# Patient Record
Sex: Male | Born: 1945
Health system: Southern US, Community
[De-identification: ages and names within clinical notes are randomized; demographics above are authoritative.]

## PROBLEM LIST (undated history)

## (undated) DIAGNOSIS — C801 Malignant (primary) neoplasm, unspecified: Secondary | ICD-10-CM

## (undated) DIAGNOSIS — Z789 Other specified health status: Secondary | ICD-10-CM

## (undated) DIAGNOSIS — Z923 Personal history of irradiation: Secondary | ICD-10-CM

## (undated) DIAGNOSIS — C8590 Non-Hodgkin lymphoma, unspecified, unspecified site: Secondary | ICD-10-CM

## (undated) HISTORY — PX: CATARACT EXTRACTION: SUR2

---

## 2002-10-23 ENCOUNTER — Encounter: Payer: Self-pay | Admitting: Emergency Medicine

## 2002-10-23 ENCOUNTER — Inpatient Hospital Stay (HOSPITAL_COMMUNITY): Admission: EM | Admit: 2002-10-23 | Discharge: 2002-10-25 | Payer: Self-pay | Admitting: Emergency Medicine

## 2011-12-03 ENCOUNTER — Other Ambulatory Visit: Payer: Self-pay | Admitting: Family Medicine

## 2011-12-03 DIAGNOSIS — Z136 Encounter for screening for cardiovascular disorders: Secondary | ICD-10-CM

## 2011-12-05 ENCOUNTER — Ambulatory Visit
Admission: RE | Admit: 2011-12-05 | Discharge: 2011-12-05 | Disposition: A | Discharge status: Home / Self Care | Payer: Managed Care, Other (non HMO) | Source: Ambulatory Visit | Attending: Family Medicine | Admitting: Family Medicine

## 2011-12-05 DIAGNOSIS — Z136 Encounter for screening for cardiovascular disorders: Secondary | ICD-10-CM

## 2012-09-17 DIAGNOSIS — Z23 Encounter for immunization: Secondary | ICD-10-CM | POA: Diagnosis not present

## 2012-12-06 DIAGNOSIS — Z13220 Encounter for screening for lipoid disorders: Secondary | ICD-10-CM | POA: Diagnosis not present

## 2012-12-06 DIAGNOSIS — Z0000 Encounter for general adult medical examination without abnormal findings: Secondary | ICD-10-CM | POA: Diagnosis not present

## 2012-12-06 DIAGNOSIS — Z131 Encounter for screening for diabetes mellitus: Secondary | ICD-10-CM | POA: Diagnosis not present

## 2012-12-06 DIAGNOSIS — E7800 Pure hypercholesterolemia, unspecified: Secondary | ICD-10-CM | POA: Diagnosis not present

## 2012-12-06 DIAGNOSIS — Z125 Encounter for screening for malignant neoplasm of prostate: Secondary | ICD-10-CM | POA: Diagnosis not present

## 2012-12-06 DIAGNOSIS — Z23 Encounter for immunization: Secondary | ICD-10-CM | POA: Diagnosis not present

## 2012-12-06 DIAGNOSIS — J069 Acute upper respiratory infection, unspecified: Secondary | ICD-10-CM | POA: Diagnosis not present

## 2013-02-03 DIAGNOSIS — Z961 Presence of intraocular lens: Secondary | ICD-10-CM | POA: Diagnosis not present

## 2013-12-12 DIAGNOSIS — Z131 Encounter for screening for diabetes mellitus: Secondary | ICD-10-CM | POA: Diagnosis not present

## 2013-12-12 DIAGNOSIS — Z23 Encounter for immunization: Secondary | ICD-10-CM | POA: Diagnosis not present

## 2013-12-12 DIAGNOSIS — Z0000 Encounter for general adult medical examination without abnormal findings: Secondary | ICD-10-CM | POA: Diagnosis not present

## 2013-12-12 DIAGNOSIS — Z136 Encounter for screening for cardiovascular disorders: Secondary | ICD-10-CM | POA: Diagnosis not present

## 2013-12-12 DIAGNOSIS — Z125 Encounter for screening for malignant neoplasm of prostate: Secondary | ICD-10-CM | POA: Diagnosis not present

## 2013-12-12 DIAGNOSIS — Z1331 Encounter for screening for depression: Secondary | ICD-10-CM | POA: Diagnosis not present

## 2014-01-19 DIAGNOSIS — Z961 Presence of intraocular lens: Secondary | ICD-10-CM | POA: Diagnosis not present

## 2014-09-15 DIAGNOSIS — Z23 Encounter for immunization: Secondary | ICD-10-CM | POA: Diagnosis not present

## 2014-12-14 DIAGNOSIS — Z125 Encounter for screening for malignant neoplasm of prostate: Secondary | ICD-10-CM | POA: Diagnosis not present

## 2014-12-14 DIAGNOSIS — Z136 Encounter for screening for cardiovascular disorders: Secondary | ICD-10-CM | POA: Diagnosis not present

## 2014-12-14 DIAGNOSIS — Z131 Encounter for screening for diabetes mellitus: Secondary | ICD-10-CM | POA: Diagnosis not present

## 2014-12-14 DIAGNOSIS — Z23 Encounter for immunization: Secondary | ICD-10-CM | POA: Diagnosis not present

## 2014-12-14 DIAGNOSIS — Z0000 Encounter for general adult medical examination without abnormal findings: Secondary | ICD-10-CM | POA: Diagnosis not present

## 2015-02-12 DIAGNOSIS — Z961 Presence of intraocular lens: Secondary | ICD-10-CM | POA: Diagnosis not present

## 2015-09-21 DIAGNOSIS — Z23 Encounter for immunization: Secondary | ICD-10-CM | POA: Diagnosis not present

## 2015-12-20 DIAGNOSIS — Z0000 Encounter for general adult medical examination without abnormal findings: Secondary | ICD-10-CM | POA: Diagnosis not present

## 2015-12-20 DIAGNOSIS — E785 Hyperlipidemia, unspecified: Secondary | ICD-10-CM | POA: Diagnosis not present

## 2015-12-20 DIAGNOSIS — Z131 Encounter for screening for diabetes mellitus: Secondary | ICD-10-CM | POA: Diagnosis not present

## 2016-02-06 HISTORY — PX: COLONOSCOPY: SHX174

## 2016-02-15 DIAGNOSIS — Z1211 Encounter for screening for malignant neoplasm of colon: Secondary | ICD-10-CM | POA: Diagnosis not present

## 2016-02-18 DIAGNOSIS — Z961 Presence of intraocular lens: Secondary | ICD-10-CM | POA: Diagnosis not present

## 2016-09-19 DIAGNOSIS — Z23 Encounter for immunization: Secondary | ICD-10-CM | POA: Diagnosis not present

## 2016-12-23 DIAGNOSIS — E7800 Pure hypercholesterolemia, unspecified: Secondary | ICD-10-CM | POA: Diagnosis not present

## 2016-12-23 DIAGNOSIS — Z125 Encounter for screening for malignant neoplasm of prostate: Secondary | ICD-10-CM | POA: Diagnosis not present

## 2016-12-23 DIAGNOSIS — Z0001 Encounter for general adult medical examination with abnormal findings: Secondary | ICD-10-CM | POA: Diagnosis not present

## 2016-12-23 DIAGNOSIS — Z79899 Other long term (current) drug therapy: Secondary | ICD-10-CM | POA: Diagnosis not present

## 2017-02-19 DIAGNOSIS — Z961 Presence of intraocular lens: Secondary | ICD-10-CM | POA: Diagnosis not present

## 2017-06-11 ENCOUNTER — Other Ambulatory Visit: Payer: Self-pay | Admitting: Family Medicine

## 2017-06-11 DIAGNOSIS — R591 Generalized enlarged lymph nodes: Secondary | ICD-10-CM

## 2017-06-12 ENCOUNTER — Ambulatory Visit
Admission: RE | Admit: 2017-06-12 | Discharge: 2017-06-12 | Disposition: A | Discharge status: Home / Self Care | Payer: Medicare Other | Source: Ambulatory Visit | Attending: Family Medicine | Admitting: Family Medicine

## 2017-06-12 DIAGNOSIS — R591 Generalized enlarged lymph nodes: Secondary | ICD-10-CM

## 2017-06-15 ENCOUNTER — Other Ambulatory Visit: Payer: Self-pay | Admitting: Family Medicine

## 2017-06-15 ENCOUNTER — Ambulatory Visit
Admission: RE | Admit: 2017-06-15 | Discharge: 2017-06-15 | Disposition: A | Discharge status: Home / Self Care | Payer: Medicare Other | Source: Ambulatory Visit | Attending: Family Medicine | Admitting: Family Medicine

## 2017-06-15 ENCOUNTER — Other Ambulatory Visit: Payer: Medicare Other

## 2017-06-15 DIAGNOSIS — R221 Localized swelling, mass and lump, neck: Secondary | ICD-10-CM

## 2017-06-15 DIAGNOSIS — R918 Other nonspecific abnormal finding of lung field: Secondary | ICD-10-CM | POA: Diagnosis not present

## 2017-06-15 MED ORDER — IOPAMIDOL (ISOVUE-300) INJECTION 61%
75.0000 mL | Freq: Once | INTRAVENOUS | Status: AC | PRN
  Administered 2017-06-15: 75 mL via INTRAVENOUS

## 2017-06-16 ENCOUNTER — Other Ambulatory Visit (HOSPITAL_COMMUNITY): Payer: Self-pay | Admitting: Family Medicine

## 2017-06-16 DIAGNOSIS — R221 Localized swelling, mass and lump, neck: Secondary | ICD-10-CM

## 2017-06-17 ENCOUNTER — Other Ambulatory Visit: Payer: Self-pay | Admitting: Otolaryngology

## 2017-06-17 DIAGNOSIS — Z7289 Other problems related to lifestyle: Secondary | ICD-10-CM | POA: Diagnosis not present

## 2017-06-17 DIAGNOSIS — Z87891 Personal history of nicotine dependence: Secondary | ICD-10-CM | POA: Diagnosis not present

## 2017-06-17 DIAGNOSIS — C770 Secondary and unspecified malignant neoplasm of lymph nodes of head, face and neck: Secondary | ICD-10-CM | POA: Diagnosis not present

## 2017-06-17 DIAGNOSIS — R590 Localized enlarged lymph nodes: Secondary | ICD-10-CM | POA: Diagnosis not present

## 2017-06-23 ENCOUNTER — Telehealth: Payer: Self-pay | Admitting: Hematology and Oncology

## 2017-06-23 ENCOUNTER — Encounter: Payer: Self-pay | Admitting: Hematology and Oncology

## 2017-06-23 NOTE — Telephone Encounter (Signed)
Appt has been scheduled for the pt to see Dr. Lebron Conners on 7/24 at 11am after his PET scan. [name] wife agreed to the appt date and time. Letter mailed and faxed to the referring.

## 2017-06-24 ENCOUNTER — Encounter: Payer: Self-pay | Admitting: *Deleted

## 2017-06-24 NOTE — Progress Notes (Signed)
Oncology Nurse Navigator Documentation  Placed introductory call to new referral patient.  Spoke with his wife as he was sleeping.    Introduced myself as the H&N oncology nurse navigator that works with Dr. Lebron Conners to whom he has been referred by Dr. Dorthy Cooler.  She confirmed his understanding of referral and appt date/time of 7/24 1100 following 0700 PET.  I briefly explained my role as their navigator, indicated that I would be joining them during his appt next week.  She indicated husband is edentulous.  I explained the purpose of a dental evaluation prior to starting RT, indicated he wd be contacted by [facility] to arrange an appt within a day or so of his appt with Dr. Isidore Moos.   I provided my contact information, encouraged them to call with questions/concerns before next week.  She verbalized understanding of information provided, expressed appreciation for my call.  Gayleen Orem, RN, BSN, [place] [facility] Oncology Nurse [place] at [place] [phone]

## 2017-06-30 ENCOUNTER — Encounter: Payer: Self-pay | Admitting: Hematology and Oncology

## 2017-06-30 ENCOUNTER — Ambulatory Visit (HOSPITAL_BASED_OUTPATIENT_CLINIC_OR_DEPARTMENT_OTHER): Payer: Medicare Other | Admitting: Hematology and Oncology

## 2017-06-30 ENCOUNTER — Encounter: Payer: Self-pay | Admitting: *Deleted

## 2017-06-30 ENCOUNTER — Ambulatory Visit (HOSPITAL_COMMUNITY)
Admission: RE | Admit: 2017-06-30 | Discharge: 2017-06-30 | Disposition: A | Discharge status: Home / Self Care | Payer: Medicare Other | Source: Ambulatory Visit | Attending: Family Medicine | Admitting: Family Medicine

## 2017-06-30 VITALS — BP 129/69 | HR 61 | Temp 98.0°F | Resp 18 | Ht 68.0 in | Wt 165.7 lb

## 2017-06-30 DIAGNOSIS — R221 Localized swelling, mass and lump, neck: Secondary | ICD-10-CM | POA: Diagnosis not present

## 2017-06-30 DIAGNOSIS — J320 Chronic maxillary sinusitis: Secondary | ICD-10-CM | POA: Diagnosis not present

## 2017-06-30 DIAGNOSIS — I700 Atherosclerosis of aorta: Secondary | ICD-10-CM | POA: Insufficient documentation

## 2017-06-30 DIAGNOSIS — D3502 Benign neoplasm of left adrenal gland: Secondary | ICD-10-CM | POA: Diagnosis not present

## 2017-06-30 DIAGNOSIS — D3500 Benign neoplasm of unspecified adrenal gland: Secondary | ICD-10-CM | POA: Diagnosis not present

## 2017-06-30 LAB — GLUCOSE, CAPILLARY: Glucose-Capillary: 108 mg/dL — ABNORMAL HIGH (ref 65–99)

## 2017-06-30 MED ORDER — FLUDEOXYGLUCOSE F - 18 (FDG) INJECTION
8.0000 | Freq: Once | INTRAVENOUS | Status: AC | PRN
  Administered 2017-06-30: 8 via INTRAVENOUS

## 2017-06-30 NOTE — Progress Notes (Signed)
[facility] New Visit:  Assessment: Mass of right side of neck Very healthy 71 year old male with appearance of a new mass in the right side of the neck extending into the supraclavicular region. Imaging has been obtained including CT of the neck, CT of the chest, and the PET CT obtained earlier today. The first 2 studies have been formally evaluated by radiology, PET/CT was looked at by myself alone. Initial radiology reporting is pending at this time  To my evaluation, PET/CT demonstrates a remarkably hypermetabolic mass in the expected location, but also a small hypermetabolic focus likely located in the right parotid gland as well as a couple of hypermetabolic foci in the mediastinum likely representing additional lymph node sites. Once of hypermetabolic disease in the lungs, retroperitoneum, abdomen, or skeletal structures on my evaluation.  At this time, the findings are strongly suggestive of a malignancy. Differential includes a lymphoma, squamous cell carcinoma of head and neck, lung cancer, in that order. Other malignancies are also possible.  Plan: --Refer back to Dr Constance Holster (ENT for repeat Bx) -- recommend incisional biopsy to obtain a large pathological sample. --RTC in my clinic 1-2 weeks subsequent to the Bx discussed the diagnosis and treatment strategies.  Voice recognition software was used and creation of this note. Despite my best effort at editing the text, some misspelling/errors may have occurred.   Orders Placed This Encounter  Procedures  . Ambulatory referral to ENT    Referral Priority:   Urgent    Referral Type:   Consultation    Referral Reason:   Specialty Services Required    Referred to Provider:   Izora Gala, MD    Requested Specialty:   Otolaryngology    Number of Visits Requested:   1    All questions were answered.  . The patient knows to call the clinic with any problems, questions or concerns.  This note was electronically  signed.    History of Presenting Illness Todd Bell 71 y.o. presenting to the [facility] for evaluation and management planning for a right cervical mass.  Patient has initially discovered asymmetry in his neck with visible mass earlier in July. He was seen by his primary care provider and subsequently directly to be evaluated by ENT. Patient underwent initially ultrasound of the neck demonstrating a large mass, subsequently he underwent CT of the neck on 06/15/17 demonstrating normal laryngeal, pharyngeal, salivary structures and a 5.0 cm supraclavicular mass on the right side. CT of the chest obtained the same time demonstrated mass, but no lymphadenopathy in the mediastinum, hilar structures, and a 0.4 cm right upper lobe nodule and a 0.5 cm left upper lobe nodule. Patient underwent an FNA on 06/17/17 which demonstrated presence of malignant cells that could not be further characterized due to insufficient sample.  Patient denies any significant symptoms in association with this mass. He denies any fevers, chills, night sweats. No significant weight loss. No changes to her activities of daily living or activity tolerance. Denies changes to his appetite. Denies chest pain, shortness of breath or cough. His nausea, early satiety, abdominal pain, diarrhea, or constipation. No dysuria or hematuria. Denies any cutaneous changes. Denies any vision changes, hearing changes, swallowing difficulties, any focal weakness or sensory deficits in the face or extremities.  Oncological/hematological History:  Medical History: History reviewed. No pertinent past medical history.  Surgical History: Past Surgical History:  Procedure Laterality Date  . COLONOSCOPY  02/2016    Family History: Family History  Problem Relation Age of Onset  . Cancer Father        Pancreatic    Social History: Social History   Social History  . Marital status: Married    Spouse name: N/A  . Number of children: N/A   . Years of education: N/A   Occupational History  . Not on file.   Social History Main Topics  . Smoking status: Former Smoker    Quit date: 1988  . Smokeless tobacco: Never Used  . Alcohol use Yes     Comment: occasional  . Drug use: No  . Sexual activity: Not on file   Other Topics Concern  . Not on file   Social History Narrative  . No narrative on file    Allergies: Not on File  Medications:  Current Outpatient Prescriptions  Medication Sig Dispense Refill  . aspirin EC 81 MG tablet Take 81 mg by mouth daily.     No current facility-administered medications for this visit.     Review of Systems: Review of Systems  All other systems reviewed and are negative.    PHYSICAL EXAMINATION Blood pressure 129/69, pulse 61, temperature 98 F (36.7 C), temperature source Oral, resp. rate 18, height 5\' 8"  (1.727 m), weight 165 lb 11.2 oz (75.2 kg), SpO2 100 %.  ECOG PERFORMANCE STATUS: 0 - Asymptomatic  Physical Exam  Constitutional: He is oriented to person, place, and time and well-developed, well-nourished, and in no distress. Vital signs are normal. He appears not malnourished, not dehydrated and not jaundiced. He does not have a sickly appearance. No distress.  HENT:  Mouth/Throat: Uvula is midline, oropharynx is clear and moist and mucous membranes are normal. Mucous membranes are not pale, not dry and not cyanotic. He has dentures. Normal dentition.  Eyes: Pupils are equal, round, and reactive to light. Conjunctivae and EOM are normal. No scleral icterus.  Neck: No JVD present. Carotid bruit is not present.  Large mass is visibly present over the middle and lower aspects of the right neck. It appears to be firm to palpation with a regular surface of hard texture. It appears to be adherent to the underlying structures.  Cardiovascular: Normal rate, regular rhythm and normal heart sounds.   No murmur heard. Pulmonary/Chest: Effort normal and breath sounds normal.  He has no decreased breath sounds. He has no wheezes. He has no rhonchi. He has no rales.  Abdominal: Soft. Normal appearance and normal aorta. There is no hepatosplenomegaly. There is no tenderness.  Lymphadenopathy:       Head (right side): No submental, no submandibular, no tonsillar and no occipital adenopathy present.       Head (left side): No submental, no submandibular, no tonsillar and no occipital adenopathy present.    He has cervical adenopathy.       Right cervical: Superficial cervical adenopathy present. No posterior cervical adenopathy present.      Left cervical: No superficial cervical, no deep cervical and no posterior cervical adenopathy present.    He has no axillary adenopathy.       Right: Supraclavicular adenopathy present. No inguinal adenopathy present.       Left: No inguinal and no supraclavicular adenopathy present.  Neurological: He is alert and oriented to person, place, and time. He has normal sensation, normal strength, normal reflexes and intact cranial nerves.  Skin: He is not diaphoretic.     LABORATORY DATA: I have personally reviewed the data as listed: Hospital Outpatient Visit on 06/30/2017  Component Date Value Ref Range Status  . Glucose-Capillary 06/30/2017 108* 65 - 99 mg/dL Final         Ardath Sax, MD

## 2017-06-30 NOTE — Assessment & Plan Note (Signed)
Very healthy 71 year old male with appearance of a new mass in the right side of the neck extending into the supraclavicular region. Imaging has been obtained including CT of the neck, CT of the chest, and the PET CT obtained earlier today. The first 2 studies have been formally evaluated by radiology, PET/CT was looked at by myself alone. Initial radiology reporting is pending at this time  To my evaluation, PET/CT demonstrates a remarkably hypermetabolic mass in the expected location, but also a small hypermetabolic focus likely located in the right parotid gland as well as a couple of hypermetabolic foci in the mediastinum likely representing additional lymph node sites. Once of hypermetabolic disease in the lungs, retroperitoneum, abdomen, or skeletal structures on my evaluation.  At this time, the findings are strongly suggestive of a malignancy. Differential includes a lymphoma, squamous cell carcinoma of head and neck, lung cancer, in that order. Other malignancies are also possible.  Plan: --Refer back to Dr Constance Holster (ENT for repeat Bx) -- recommend incisional biopsy to obtain a large pathological sample. --RTC in my clinic 1-2 weeks subsequent to the Bx discussed the diagnosis and treatment strategies.  Voice recognition software was used and creation of this note. Despite my best effort at editing the text, some misspelling/errors may have occurred.

## 2017-07-01 NOTE — Progress Notes (Signed)
Oncology Nurse Navigator Documentation  Met with patient during initial consult with Dr. Lebron Conners.  He was accompanied by his wife. 1. Further introduced myself as his/their Navigator, explained my role as a member of the Care Team. 2. Provided New Patient Information packet:  Contact information for physician, this navigator, other members of the Care Team  Advance Directive information ([place] blue pamphlet with LCSW insert)  Fall Prevention Patient Safety Plan  Appointment [place] sheet  [facility] campus map with highlight of [place]. 3. They voiced understanding of favorable PET results, need for surgical bx to be requested with ENT Dr. Constance Holster by Dr. Lebron Conners. I encouraged them to call with questions/concerns, they verbalized understanding.  Gayleen Orem, RN, BSN, [place] [facility] Oncology Nurse [place] at [place] [phone]

## 2017-07-06 ENCOUNTER — Telehealth: Payer: Self-pay | Admitting: *Deleted

## 2017-07-06 NOTE — Telephone Encounter (Signed)
Oncology Nurse Navigator Documentation  Received call from patient wife indicating they have not heard from Dr. Janeice Robinson office re scheduling of surgical bx s/p last week's consult with Dr. Lebron Conners.  I LVMM with Dr. Janeice Robinson MA Olivia Mackie with this information, called patient wife to update.  Gayleen Orem, RN, BSN, [place] [facility]ck Oncology Nurse [place] at [place] [phone]

## 2017-07-08 NOTE — H&P (Signed)
HPI:   Todd Bell is a 71 y.o. male who presents as a consult Patient.   Referring Provider: Norman Herrlich, MD  Chief complaint: Neck mass.  HPI: Very healthy gentleman, noticed a mass in the lower right neck a couple of weeks ago while shaving. He has no pain. He denies any fever, chills, change in appetite, weight loss, sore throat, ear pain, trouble breathing, swallowing, voice change. He smoked many years ago. He drinks occasionally. CT scan revealed a suspicious supraclavicular mass, he is scheduled for PET scan in a couple of weeks.  PMH/Meds/All/SocHx/FamHx/ROS:   Past Medical History:  Diagnosis Date  . Throat cancer [facility])   Past Surgical History:  Procedure Laterality Date  . CATARACT EXTRACTION W/ INTRAOCULAR LENS IMPLANT Bilateral 2010   No family history of bleeding disorders, wound healing problems or difficulty with anesthesia.   Social History   Social History  . Marital status: Married  Spouse name: N/A  . Number of children: N/A  . Years of education: N/A   Occupational History  . Not on file.   Social History Main Topics  . Smoking status: Former Smoker  Types: Cigarettes  . Smokeless tobacco: Never Used  . Alcohol use Yes  . Drug use: Unknown  . Sexual activity: Not on file   Other Topics Concern  . Not on file   Social History Narrative  . No narrative on file   Current Outpatient Prescriptions:  . ibuprofen (ADVIL ORAL), Take by mouth as needed., Disp: , Rfl:  . aspirin 81 MG chewable tablet *ANTIPLATELET*, Take 81 mg by mouth daily., Disp: , Rfl:   A complete ROS was performed with pertinent positives/negatives noted in the HPI. The remainder of the ROS are negative.   Physical Exam:   BP 130/79   General: Healthy and alert, in no distress, breathing easily. Normal affect. In a pleasant mood. Head: Normocephalic, atraumatic. No masses, or scars. Eyes: Pupils are equal, and reactive to light. Vision is grossly intact. No  spontaneous or gaze nystagmus. Ears: Ear canals are clear. Tympanic membranes are intact, with normal landmarks and the middle ears are clear and healthy. Hearing: Grossly normal. Nose: Nasal cavities are clear with healthy mucosa, no polyps or exudate.Airways are patent. Face: No masses or scars, facial nerve function is symmetric. Oral Cavity: No mucosal abnormalities are noted. Tongue with normal mobility. Dentition appears healthy. Oropharynx: Tonsils are symmetric. There are no mucosal masses identified. Tongue base appears normal and healthy. Larynx/Hypopharynx: Inadequate to visualize the larynx, hypopharynx or nasopharynx. Chest: Deferred Neck: Large firm supraclavicular mass on the right, at least 6 cm in diameter, no thyroid nodules or enlargement. Neuro: Cranial nerves II-XII will normal function. Balance: Normal gate. Other findings: none.  Independent Review of Additional Tests or Records:  none  Procedures:  Procedure note: Flexible fiberoptic laryngoscopy  Details of the procedure were explained to the patient and all questions were answered.   Procedure:   After anesthetizing the nasal cavity with topical lidocaine and oxymetazoline, the flexible endoscope was introduced and passed through the nasal cavity into the nasopharynx. The scope was then advanced to the level of the oropharynx, then the hypopharynx and larynx.  Findings:   The posterior soft palate, uvula, tongue base and vallecula were visualized and appeared healthy without mucosal masses or lesions. The epiglottis, aryepiglottic folds, hypopharynx, supraglottis, glottis were visualized and appeared healthy without mucosal masses or lesions. Vocal fold mobility was intact and symmetric.   Additional findings: None  The scope was withdrawn from the nose. He tolerated the procedure well.  Procedure Note:  Indications for procedure: neck mass  Details of the procedure were discussed with the patient and  all questions were answered.  Procedure:  2% xylocaine with epinephrine was infiltrated into the overlying skin. First pass was made with a 25 gauge needle and 10 cc syringe. Second pass was made with a 22 gauge needle. Specimen was placed on microscopic slides and air-dried. Additional material was placed in Cytolyte solution for cell block preparation. A third pass was made with a 22 guage needle and sample was added to the cytolyte solution.  A bandage was applied.   He tolerated the procedure well. Results will be discussed when available.  Impression & Plans:  Suspect supraclavicular mass, probable metastatic carcinoma or possibly lymphoma. Possible sources include head neck, thyroid, GI tract, chest. No evidence of nasopharyngeal mass or other upper aerodigestive tract masses seen on endoscopy today. PET scan scheduled for couple weeks from now. Await the cytologic findings and then we will discuss possible next step.

## 2017-07-09 ENCOUNTER — Telehealth: Payer: Self-pay | Admitting: *Deleted

## 2017-07-09 ENCOUNTER — Encounter: Payer: Self-pay | Admitting: Radiation Oncology

## 2017-07-09 ENCOUNTER — Encounter (HOSPITAL_BASED_OUTPATIENT_CLINIC_OR_DEPARTMENT_OTHER): Payer: Self-pay | Admitting: *Deleted

## 2017-07-09 NOTE — Telephone Encounter (Signed)
Oncology Nurse Navigator Documentation  Called patient wife, informed her of 8/10 8:00 appt with Dr. Isidore Moos, Radiation Oncology, preceded by 7:30 Nurse Eval.  I explained registration procedure, arrival to Radiation Waiting.  She voiced understanding.  Gayleen Orem, RN, BSN, [place] [facility] Oncology Nurse [place] at [place] [phone]

## 2017-07-10 NOTE — Progress Notes (Signed)
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   Patient presented with symptoms of: He noticed a right neck mass mid July while shaving.   Biopsies of revealed:  06/17/2017 Final Cytologic Interpretation Right supraclavicular mass, Fine Needle Aspiration I (smears and cell block): Malignant neoplasm.  Open biopsy planned for 07/13/17 by Dr. 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   Nutrition Status Yes No Comments  Weight changes? []  [x]    Swallowing concerns? []  [x]    PEG? []  []     Referrals Yes No Comments  Social Work? []  [x]    Dentistry? []  [x]    Swallowing therapy? []  [x]    Nutrition? []  [x]    Med/Onc? [x]  []  07/17/17 Appointment with Dr. Lebron Conners   Safety Issues Yes No Comments  Prior radiation? []  [x]    Pacemaker/ICD? []  [x]    Possible current pregnancy? []  [x]    Is the patient on methotrexate? []  [x]     Tobacco/Marijuana/Snuff/ETOH use: former smoker of cigarettes, drinks alcohol  Past/Anticipated interventions by otolaryngology, if any:  07/13/2017 PROCEDURE: Open Neck Node Biopsy-right side by Dr. Constance Holster   Past/Anticipated interventions by medical oncology, if any:  06/30/17 Plan: --Refer back to Dr Constance Holster (ENT for repeat Bx) -- recommend incisional biopsy to obtain a large pathological sample. --RTC in my clinic 1-2 weeks subsequent to the Bx discussed the diagnosis and treatment strategies Dr. Lebron Conners 07/17/17   Current Complaints / other details:   06/30/2017 NM PET Image Initial (PI) Skull Base To Thigh   IMPRESSION: 1. Highly hypermetabolic right conglomerate supraclavicular mass. This is accompanied by accentuated metabolic activity in the palatine tonsillar pillars, a hypermetabolic right buccinator lymph node, hypermetabolic thoracic  lymph nodes including a mildly enlarged AP window lymph node and other upper normal size but mildly hypermetabolic hilar and infrahilar nodes. Appearance compatible with malignancy such as lymphoma or adenocarcinoma, tissue diagnosis is recommended and probably most readily accessible from the large right supraclavicular mass. 2. Aortic Atherosclerosis (ICD10-I70.0). 3. Left adrenal adenoma. 4. Chronic left maxillary sinusitis.  06/15/2017 CT CHEST W CONTRAST   IMPRESSION: Bulky RIGHT supraclavicular mass, likely metastatic lymphadenopathy. See discussion above.  06/15/2017 CT CHEST W CONTRAST  IMPRESSION: 1. Large RIGHT supraclavicular nodal mass suggests metastatic adenopathy. Consider FDG PET scan for staging and and primary carcinoma localization. 2. Single subcentimeter pulmonary nodules within the RIGHT upper lobe and LEFT lower lobe. Indeterminate finding. 3. Several Calcified pleural plaques within the RIGHT hemithorax.  BP 119/89   Pulse 76   Temp 97.7 F (36.5 C)   Ht 5\' 8"  (1.727 m)   Wt 167 lb 3.2 oz (75.8 kg)   SpO2 98% Comment: room air  BMI 25.42 kg/m    Wt Readings from Last 3 Encounters:  07/17/17 167 lb 3.2 oz (75.8 kg)  07/13/17 162 lb 6 oz (73.7 kg)  06/30/17 165 lb 11.2 oz (75.2 kg)

## 2017-07-13 ENCOUNTER — Encounter (HOSPITAL_BASED_OUTPATIENT_CLINIC_OR_DEPARTMENT_OTHER): Payer: Self-pay | Admitting: Anesthesiology

## 2017-07-13 ENCOUNTER — Ambulatory Visit (HOSPITAL_BASED_OUTPATIENT_CLINIC_OR_DEPARTMENT_OTHER): Payer: Medicare Other | Admitting: Anesthesiology

## 2017-07-13 ENCOUNTER — Ambulatory Visit (HOSPITAL_BASED_OUTPATIENT_CLINIC_OR_DEPARTMENT_OTHER)
Admission: RE | Admit: 2017-07-13 | Discharge: 2017-07-13 | Disposition: A | Discharge status: Home / Self Care | Payer: Medicare Other | Source: Ambulatory Visit | Attending: Otolaryngology | Admitting: Otolaryngology

## 2017-07-13 ENCOUNTER — Encounter (HOSPITAL_BASED_OUTPATIENT_CLINIC_OR_DEPARTMENT_OTHER)
Admission: RE | Disposition: A | Discharge status: Home / Self Care | Payer: Self-pay | Source: Ambulatory Visit | Attending: Otolaryngology

## 2017-07-13 DIAGNOSIS — Z87891 Personal history of nicotine dependence: Secondary | ICD-10-CM | POA: Insufficient documentation

## 2017-07-13 DIAGNOSIS — C8581 Other specified types of non-Hodgkin lymphoma, lymph nodes of head, face, and neck: Secondary | ICD-10-CM | POA: Diagnosis not present

## 2017-07-13 DIAGNOSIS — C8331 Diffuse large B-cell lymphoma, lymph nodes of head, face, and neck: Secondary | ICD-10-CM | POA: Diagnosis not present

## 2017-07-13 DIAGNOSIS — R221 Localized swelling, mass and lump, neck: Secondary | ICD-10-CM | POA: Diagnosis not present

## 2017-07-13 DIAGNOSIS — Z7982 Long term (current) use of aspirin: Secondary | ICD-10-CM | POA: Insufficient documentation

## 2017-07-13 DIAGNOSIS — Z85819 Personal history of malignant neoplasm of unspecified site of lip, oral cavity, and pharynx: Secondary | ICD-10-CM | POA: Insufficient documentation

## 2017-07-13 HISTORY — DX: Other specified health status: Z78.9

## 2017-07-13 HISTORY — PX: LYMPH NODE BIOPSY: SHX201

## 2017-07-13 SURGERY — LYMPH NODE BIOPSY
Anesthesia: General | Laterality: Right

## 2017-07-13 MED ORDER — DEXAMETHASONE SODIUM PHOSPHATE 10 MG/ML IJ SOLN
INTRAMUSCULAR | Status: AC
  Filled 2017-07-13: qty 1

## 2017-07-13 MED ORDER — SCOPOLAMINE 1 MG/3DAYS TD PT72: 1.0000 | MEDICATED_PATCH | Freq: Once | TRANSDERMAL | Status: DC | PRN

## 2017-07-13 MED ORDER — ONDANSETRON HCL 4 MG/2ML IJ SOLN
INTRAMUSCULAR | Status: AC
  Filled 2017-07-13: qty 2

## 2017-07-13 MED ORDER — PROPOFOL 10 MG/ML IV BOLUS
INTRAVENOUS | Status: AC
Start: 2017-07-13 — End: ?
  Filled 2017-07-13: qty 20

## 2017-07-13 MED ORDER — PROPOFOL 10 MG/ML IV BOLUS
INTRAVENOUS | Status: DC | PRN
  Administered 2017-07-13: 140 mg via INTRAVENOUS

## 2017-07-13 MED ORDER — ONDANSETRON HCL 4 MG/2ML IJ SOLN
INTRAMUSCULAR | Status: DC | PRN
  Administered 2017-07-13: 4 mg via INTRAVENOUS

## 2017-07-13 MED ORDER — LIDOCAINE HCL (CARDIAC) 20 MG/ML IV SOLN
INTRAVENOUS | Status: DC | PRN
  Administered 2017-07-13: 60 mg via INTRAVENOUS

## 2017-07-13 MED ORDER — DEXAMETHASONE SODIUM PHOSPHATE 4 MG/ML IJ SOLN
INTRAMUSCULAR | Status: DC | PRN
  Administered 2017-07-13: 10 mg via INTRAVENOUS

## 2017-07-13 MED ORDER — LIDOCAINE 2% (20 MG/ML) 5 ML SYRINGE
INTRAMUSCULAR | Status: AC
  Filled 2017-07-13: qty 5

## 2017-07-13 MED ORDER — LACTATED RINGERS IV SOLN
INTRAVENOUS | Status: DC
  Administered 2017-07-13: 10:00:00 via INTRAVENOUS

## 2017-07-13 MED ORDER — FENTANYL CITRATE (PF) 100 MCG/2ML IJ SOLN
INTRAMUSCULAR | Status: AC
  Filled 2017-07-13: qty 2

## 2017-07-13 MED ORDER — FENTANYL CITRATE (PF) 100 MCG/2ML IJ SOLN
50.0000 ug | INTRAMUSCULAR | Status: DC | PRN
  Administered 2017-07-13 (×2): 50 ug via INTRAVENOUS

## 2017-07-13 MED ORDER — CEPHALEXIN 500 MG PO CAPS: 500.0000 mg | ORAL_CAPSULE | Freq: Three times a day (TID) | ORAL | 0 refills | Status: DC

## 2017-07-13 MED ORDER — MIDAZOLAM HCL 2 MG/2ML IJ SOLN: 1.0000 mg | INTRAMUSCULAR | Status: DC | PRN

## 2017-07-13 SURGICAL SUPPLY — 57 items
ATTRACTOMAT 16X20 MAGNETIC DRP (DRAPES) IMPLANT
BENZOIN TINCTURE PRP APPL 2/3 (GAUZE/BANDAGES/DRESSINGS) IMPLANT
BLADE SURG 15 STRL LF DISP TIS (BLADE) ×1 IMPLANT
BLADE SURG 15 STRL SS (BLADE) ×2
CANISTER SUCT 1200ML W/VALVE (MISCELLANEOUS) ×3 IMPLANT
CLEANER CAUTERY TIP 5X5 PAD (MISCELLANEOUS) ×1 IMPLANT
CLIP VESOCCLUDE MED 6/CT (CLIP) IMPLANT
CLIP VESOCCLUDE SM WIDE 6/CT (CLIP) IMPLANT
CLOSURE WOUND 1/2 X4 (GAUZE/BANDAGES/DRESSINGS)
CORD BIPOLAR FORCEPS 12FT (ELECTRODE) IMPLANT
COVER BACK TABLE 60X90IN (DRAPES) ×3 IMPLANT
COVER MAYO STAND STRL (DRAPES) ×3 IMPLANT
DERMABOND ADVANCED (GAUZE/BANDAGES/DRESSINGS) ×2
DERMABOND ADVANCED .7 DNX12 (GAUZE/BANDAGES/DRESSINGS) ×1 IMPLANT
DRAIN JACKSON RD 7FR 3/32 (WOUND CARE) IMPLANT
DRAIN PENROSE 1/4X12 LTX STRL (WOUND CARE) IMPLANT
DRAPE U-SHAPE 76X120 STRL (DRAPES) ×3 IMPLANT
ELECT COATED BLADE 2.86 ST (ELECTRODE) ×3 IMPLANT
ELECT REM PT RETURN 9FT ADLT (ELECTROSURGICAL) ×3
ELECTRODE REM PT RTRN 9FT ADLT (ELECTROSURGICAL) ×1 IMPLANT
EVACUATOR SILICONE 100CC (DRAIN) IMPLANT
GAUZE SPONGE 4X4 12PLY STRL LF (GAUZE/BANDAGES/DRESSINGS) IMPLANT
GAUZE SPONGE 4X4 16PLY XRAY LF (GAUZE/BANDAGES/DRESSINGS) IMPLANT
GLOVE BIO SURGEON STRL SZ 6.5 (GLOVE) ×2 IMPLANT
GLOVE BIO SURGEONS STRL SZ 6.5 (GLOVE) ×1
GLOVE BIOGEL PI IND STRL 7.0 (GLOVE) ×2 IMPLANT
GLOVE BIOGEL PI INDICATOR 7.0 (GLOVE) ×4
GLOVE ECLIPSE 7.5 STRL STRAW (GLOVE) ×3 IMPLANT
GOWN STRL REUS W/ TWL LRG LVL3 (GOWN DISPOSABLE) ×2 IMPLANT
GOWN STRL REUS W/ TWL XL LVL3 (GOWN DISPOSABLE) ×1 IMPLANT
GOWN STRL REUS W/TWL LRG LVL3 (GOWN DISPOSABLE) ×4
GOWN STRL REUS W/TWL XL LVL3 (GOWN DISPOSABLE) ×2
NEEDLE PRECISIONGLIDE 27X1.5 (NEEDLE) IMPLANT
NS IRRIG 1000ML POUR BTL (IV SOLUTION) ×3 IMPLANT
PACK BASIN DAY SURGERY FS (CUSTOM PROCEDURE TRAY) ×3 IMPLANT
PAD CLEANER CAUTERY TIP 5X5 (MISCELLANEOUS) ×2
PENCIL FOOT CONTROL (ELECTRODE) ×3 IMPLANT
RUBBERBAND STERILE (MISCELLANEOUS) IMPLANT
SPONGE GAUZE 2X2 8PLY STER LF (GAUZE/BANDAGES/DRESSINGS)
SPONGE GAUZE 2X2 8PLY STRL LF (GAUZE/BANDAGES/DRESSINGS) IMPLANT
STRIP CLOSURE SKIN 1/2X4 (GAUZE/BANDAGES/DRESSINGS) IMPLANT
SUCTION FRAZIER HANDLE 10FR (MISCELLANEOUS)
SUCTION TUBE FRAZIER 10FR DISP (MISCELLANEOUS) IMPLANT
SUT CHROMIC 3 0 PS 2 (SUTURE) ×3 IMPLANT
SUT CHROMIC 4 0 P 3 18 (SUTURE) IMPLANT
SUT ETHILON 4 0 PS 2 18 (SUTURE) IMPLANT
SUT ETHILON 5 0 P 3 18 (SUTURE)
SUT NYLON ETHILON 5-0 P-3 1X18 (SUTURE) IMPLANT
SUT PLAIN 5 0 P 3 18 (SUTURE) IMPLANT
SUT SILK 4 0 TIES 17X18 (SUTURE) IMPLANT
SUT VICRYL 4-0 PS2 18IN ABS (SUTURE) IMPLANT
SYR BULB 3OZ (MISCELLANEOUS) ×3 IMPLANT
SYR CONTROL 10ML LL (SYRINGE) ×3 IMPLANT
TOWEL OR 17X24 6PK STRL BLUE (TOWEL DISPOSABLE) ×3 IMPLANT
TRAY DSU PREP LF (CUSTOM PROCEDURE TRAY) ×3 IMPLANT
TUBE CONNECTING 20'X1/4 (TUBING) ×1
TUBE CONNECTING 20X1/4 (TUBING) ×2 IMPLANT

## 2017-07-13 NOTE — Anesthesia Postprocedure Evaluation (Signed)
Anesthesia Post Note  Patient: Todd Bell  Procedure(s) Performed: Procedure(s) (LRB): OPEN NECK LYMPH NODE BIOPSY RIGHT SIDE (Right)     Patient location during evaluation: PACU Anesthesia Type: General Level of consciousness: awake and alert Pain management: pain level controlled Vital Signs Assessment: post-procedure vital signs reviewed and stable Respiratory status: spontaneous breathing, nonlabored ventilation, respiratory function stable and patient connected to nasal cannula oxygen Cardiovascular status: blood pressure returned to baseline and stable Postop Assessment: no signs of nausea or vomiting Anesthetic complications: no    Last Vitals:  Vitals:   07/13/17 1202 07/13/17 1227  BP:    Pulse: (!) 55 (!) 58  Resp: 10 20  Temp:  36.5 C    Last Pain:  Vitals:   07/13/17 1227  TempSrc:   PainSc: 0-No pain                 [name]

## 2017-07-13 NOTE — Anesthesia Preprocedure Evaluation (Signed)
Anesthesia Evaluation  Patient identified by MRN, date of birth, ID band Patient awake    Reviewed: Allergy & Precautions, NPO status , Patient's Chart, lab work & pertinent test results  Airway Mallampati: II  TM Distance: >3 FB Neck ROM: Full    Dental no notable dental hx. (+) Missing   Pulmonary neg pulmonary ROS, former smoker,    Pulmonary exam normal breath sounds clear to auscultation       Cardiovascular negative cardio ROS Normal cardiovascular exam Rhythm:Regular Rate:Normal     Neuro/Psych negative neurological ROS  negative psych ROS   GI/Hepatic negative GI ROS, Neg liver ROS,   Endo/Other  negative endocrine ROS  Renal/GU negative Renal ROS  negative genitourinary   Musculoskeletal negative musculoskeletal ROS (+)   Abdominal   Peds negative pediatric ROS (+)  Hematology negative hematology ROS (+)   Anesthesia Other Findings   Reproductive/Obstetrics negative OB ROS                             Anesthesia Physical Anesthesia Plan  ASA: II  Anesthesia Plan: General   Post-op Pain Management:    Induction: Intravenous  PONV Risk Score and Plan: 2 and Ondansetron and Dexamethasone  Airway Management Planned: LMA  Additional Equipment:   Intra-op Plan:   Post-operative Plan: Extubation in OR  Informed Consent: I have reviewed the patients History and Physical, chart, labs and discussed the procedure including the risks, benefits and alternatives for the proposed anesthesia with the patient or authorized representative who has indicated his/her understanding and acceptance.   Dental advisory given  Plan Discussed with: CRNA  Anesthesia Plan Comments:         Anesthesia Quick Evaluation

## 2017-07-13 NOTE — Discharge Instructions (Signed)
You may shower and use soap and water. Do not use any creams, oils or ointment. ° °Post Anesthesia Home Care Instructions ° °Activity: °Get plenty of rest for the remainder of the day. A responsible individual must stay with you for 24 hours following the procedure.  °For the next 24 hours, DO NOT: °-Drive a car °-Operate machinery °-Drink alcoholic beverages °-Take any medication unless instructed by your physician °-Make any legal decisions or sign important papers. ° °Meals: °Start with liquid foods such as gelatin or soup. Progress to regular foods as tolerated. Avoid greasy, spicy, heavy foods. If nausea and/or vomiting occur, drink only clear liquids until the nausea and/or vomiting subsides. Call your physician if vomiting continues. ° °Special Instructions/Symptoms: °Your throat may feel dry or sore from the anesthesia or the breathing tube placed in your throat during surgery. If this causes discomfort, gargle with warm salt water. The discomfort should disappear within 24 hours. ° °If you had a scopolamine patch placed behind your ear for the management of post- operative nausea and/or vomiting: ° °1. The medication in the patch is effective for 72 hours, after which it should be removed.  Wrap patch in a tissue and discard in the trash. Wash hands thoroughly with soap and water. °2. You may remove the patch earlier than 72 hours if you experience unpleasant side effects which may include dry mouth, dizziness or visual disturbances. °3. Avoid touching the patch. Wash your hands with soap and water after contact with the patch. °   ° °

## 2017-07-13 NOTE — Interval H&P Note (Signed)
History and Physical Interval Note:  07/13/2017 9:43 AM  Todd Bell  has presented today for surgery, with the diagnosis of CERVICAL MASS  The various methods of treatment have been discussed with the patient and family. After consideration of risks, benefits and other options for treatment, the patient has consented to  Procedure(s): OPEN NECK LYMPH NODE BIOPSY RIGHT SIDE (Right) as a surgical intervention .  The patient's history has been reviewed, patient examined, no change in status, stable for surgery.  I have reviewed the patient's chart and labs.  Questions were answered to the patient's satisfaction.     [name]

## 2017-07-13 NOTE — Anesthesia Procedure Notes (Signed)
Procedure Name: LMA Insertion Date/Time: 07/13/2017 10:26 AM Performed by: Maryella Shivers Pre-anesthesia Checklist: Patient identified, Emergency Drugs available, Suction available and Patient being monitored Patient Re-evaluated:Patient Re-evaluated prior to induction Oxygen Delivery Method: Circle system utilized Preoxygenation: Pre-oxygenation with 100% oxygen Induction Type: IV induction Ventilation: Mask ventilation without difficulty LMA: LMA inserted LMA Size: 5.0 Number of attempts: 1 Airway Equipment and Method: Bite block Placement Confirmation: positive ETCO2 Tube secured with: Tape Dental Injury: Teeth and Oropharynx as per pre-operative assessment

## 2017-07-13 NOTE — Transfer of Care (Signed)
Immediate Anesthesia Transfer of Care Note  Patient: Todd Bell  Procedure(s) Performed: Procedure(s): OPEN NECK LYMPH NODE BIOPSY RIGHT SIDE (Right)  Patient Location: PACU  Anesthesia Type:General  Level of Consciousness: sedated  Airway & Oxygen Therapy: Patient Spontanous Breathing and Patient connected to face mask oxygen  Post-op Assessment: Report given to RN and Post -op Vital signs reviewed and stable  Post vital signs: Reviewed and stable  Last Vitals:  Vitals:   07/13/17 0952 07/13/17 1112  BP: 131/77   Pulse: 60 (!) 57  Resp: 18 (!) 8  Temp: 36.5 C     Last Pain:  Vitals:   07/13/17 0952  TempSrc: Oral         Complications: No apparent anesthesia complications

## 2017-07-13 NOTE — Op Note (Signed)
OPERATIVE REPORT  DATE OF SURGERY: 07/13/2017  PATIENT:  Todd Bell,  71 y.o. male  PRE-OPERATIVE DIAGNOSIS:  CERVICAL MASS  POST-OPERATIVE DIAGNOSIS:  CERVICAL MASS  PROCEDURE:  Procedure(s): OPEN NECK LYMPH NODE BIOPSY RIGHT SIDE  SURGEON:  Beckie Salts, MD  ASSISTANTS: None  ANESTHESIA:   General   EBL:  5 ml  DRAINS: None  LOCAL MEDICATIONS USED:  None  SPECIMEN:  Right neck mass sent fresh for lymphoma workup  COUNTS:  Correct  PROCEDURE DETAILS: The patient was taken to the operating room and placed on the operating table in the supine position. Following induction of general endotracheal anesthesia, the right neck was prepped and draped in standard fashion. An incision was outlined with a marking pen just above the clavicle along the anterior aspect of the sternocleidomastoid muscle. Much cautery was used to incise the skin and subcutaneous tissue as well as through the platysma. A small [place] or was used for retraction. The mass was identified just below the level of the SCM muscle between the 2 heads. Light cautery was used to remove a portion of the mass, aggregate size approximately 1.5 mL. This was sent fresh for pathologic evaluation. Cautery was used for completion of hemostasis. The wound was irrigated with saline. The deep closure was accomplished using interrupted 3-0 chromic and a running 3-0 chromic subcuticular closure was used. Dermabond was used on the skin. The patient was awakened extubated and transferred to recovery in stable condition.    PATIENT DISPOSITION:  To PACU, stable

## 2017-07-14 ENCOUNTER — Encounter (HOSPITAL_BASED_OUTPATIENT_CLINIC_OR_DEPARTMENT_OTHER): Payer: Self-pay | Admitting: Otolaryngology

## 2017-07-17 ENCOUNTER — Encounter: Payer: Self-pay | Admitting: Radiation Oncology

## 2017-07-17 ENCOUNTER — Ambulatory Visit (HOSPITAL_BASED_OUTPATIENT_CLINIC_OR_DEPARTMENT_OTHER): Payer: Medicare Other

## 2017-07-17 ENCOUNTER — Encounter: Payer: Self-pay | Admitting: *Deleted

## 2017-07-17 ENCOUNTER — Ambulatory Visit
Admission: RE | Admit: 2017-07-17 | Discharge: 2017-07-17 | Disposition: A | Discharge status: Home / Self Care | Payer: Medicare Other | Source: Ambulatory Visit | Attending: Radiation Oncology | Admitting: Radiation Oncology

## 2017-07-17 ENCOUNTER — Ambulatory Visit (HOSPITAL_BASED_OUTPATIENT_CLINIC_OR_DEPARTMENT_OTHER): Payer: Medicare Other | Admitting: Hematology and Oncology

## 2017-07-17 VITALS — BP 119/89 | HR 76 | Temp 97.7°F | Ht 68.0 in | Wt 167.2 lb

## 2017-07-17 VITALS — BP 143/81 | HR 56 | Temp 97.9°F | Resp 18 | Ht 68.0 in | Wt 167.1 lb

## 2017-07-17 DIAGNOSIS — Z7982 Long term (current) use of aspirin: Secondary | ICD-10-CM | POA: Insufficient documentation

## 2017-07-17 DIAGNOSIS — Z1329 Encounter for screening for other suspected endocrine disorder: Secondary | ICD-10-CM

## 2017-07-17 DIAGNOSIS — C8331 Diffuse large B-cell lymphoma, lymph nodes of head, face, and neck: Secondary | ICD-10-CM

## 2017-07-17 DIAGNOSIS — Z79899 Other long term (current) drug therapy: Secondary | ICD-10-CM | POA: Insufficient documentation

## 2017-07-17 DIAGNOSIS — R221 Localized swelling, mass and lump, neck: Secondary | ICD-10-CM

## 2017-07-17 DIAGNOSIS — R5381 Other malaise: Secondary | ICD-10-CM

## 2017-07-17 DIAGNOSIS — C8589 Other specified types of non-Hodgkin lymphoma, extranodal and solid organ sites: Secondary | ICD-10-CM | POA: Diagnosis not present

## 2017-07-17 DIAGNOSIS — Z87891 Personal history of nicotine dependence: Secondary | ICD-10-CM | POA: Insufficient documentation

## 2017-07-17 LAB — COMPREHENSIVE METABOLIC PANEL
ALBUMIN: 3.9 g/dL (ref 3.5–5.0)
ALK PHOS: 71 U/L (ref 40–150)
ALT: 14 U/L (ref 0–55)
ANION GAP: 7 meq/L (ref 3–11)
AST: 18 U/L (ref 5–34)
BILIRUBIN TOTAL: 0.43 mg/dL (ref 0.20–1.20)
BUN: 14.5 mg/dL (ref 7.0–26.0)
CALCIUM: 9.6 mg/dL (ref 8.4–10.4)
CO2: 29 mEq/L (ref 22–29)
Chloride: 104 mEq/L (ref 98–109)
Creatinine: 0.9 mg/dL (ref 0.7–1.3)
EGFR: 85 mL/min/{1.73_m2} — AB (ref >90)
Glucose: 105 mg/dl (ref 70–140)
POTASSIUM: 4.3 meq/L (ref 3.5–5.1)
SODIUM: 140 meq/L (ref 136–145)
Total Protein: 7.8 g/dL (ref 6.4–8.3)

## 2017-07-17 LAB — CBC WITH DIFFERENTIAL/PLATELET
BASO%: 0.4 % (ref 0.0–2.0)
BASOS ABS: 0 10*3/uL (ref 0.0–0.1)
EOS%: 10.5 % — ABNORMAL HIGH (ref 0.0–7.0)
Eosinophils Absolute: 0.7 10*3/uL — ABNORMAL HIGH (ref 0.0–0.5)
HEMATOCRIT: 46.3 % (ref 38.4–49.9)
HEMOGLOBIN: 15.4 g/dL (ref 13.0–17.1)
LYMPH%: 16.9 % (ref 14.0–49.0)
MCH: 31.8 pg (ref 27.2–33.4)
MCHC: 33.3 g/dL (ref 32.0–36.0)
MCV: 95.5 fL (ref 79.3–98.0)
MONO#: 0.7 10*3/uL (ref 0.1–0.9)
MONO%: 9.9 % (ref 0.0–14.0)
NEUT#: 4.1 10*3/uL (ref 1.5–6.5)
NEUT%: 62.3 % (ref 39.0–75.0)
PLATELETS: 247 10*3/uL (ref 140–400)
RBC: 4.85 10*6/uL (ref 4.20–5.82)
RDW: 13.9 % (ref 11.0–14.6)
WBC: 6.6 10*3/uL (ref 4.0–10.3)
lymph#: 1.1 10*3/uL (ref 0.9–3.3)

## 2017-07-17 LAB — LACTATE DEHYDROGENASE: LDH: 247 U/L — AB (ref 125–245)

## 2017-07-17 LAB — URIC ACID: URIC ACID, SERUM: 6 mg/dL (ref 2.6–7.4)

## 2017-07-17 NOTE — Progress Notes (Signed)
Radiation Oncology         [phone] ________________________________  Initial outpatient Consultation  Name: Todd Bell MRN: [id number]  Date: 07/17/2017  DOB: 01-01-1946  VH:QIONGEX, Dibas, MD  Izora Gala, MD   REFERRING PHYSICIAN: Izora Gala, MD  DIAGNOSIS:    ICD-10-CM   1. Diffuse large B-cell lymphoma of lymph nodes of neck (HCC) C83.31    STAGE IIA DLBCL of the neck/chest  CHIEF COMPLAINT: Here to discuss management of diffuse large B-cell lymphoma cancer  HISTORY OF PRESENT ILLNESS:Todd Bell is a 71 y.o. male who presented with a mass to his right lower neck in July 2018 while shaving and followed up at [facility] prior to his appointment with Otolaryngologist, Dr. Izora Gala.  Subsequently, the patient saw Dr. Izora Gala who completed fine needle aspiration (smears and cell block) of the right supraclavicular mass on 06/17/17. Dr. Izora Gala also completed an open neck lymph node biopsy on 07/13/17.   Fine needle aspiration of right supraclavicular mass on 06/17/17 revealed: malignant neoplasm.   Biopsy of open neck lymph node on 07/13/17 revealed: lymph node for lymphoma, right neck, diffuse large B-cell lymphoma.   Pertinent imaging thus far includes US soft tissue head and neck performed on 06/12/17 revealing: The palpable abnormality corresponds with a complex cystic mass measuring nearly 6 cm in the right supraclavicular space lateral to the thyroid gland and carotid vessels. This is most concerning for essentially necrotic metastatic adenopathy. A complex abscess could have a similar appearance.  Pt had CT soft tissue neck with contrast on 06/15/17 revealing: Bulky RIGHT supraclavicular mass, likely metastatic lymphadenopathy.  Pt had CT chest with contrast on 06/15/17 revealing: Large RIGHT supraclavicular nodal mass suggests metastatic adenopathy.   Pt had a PET scan performed on 06/30/17 revealing: Highly hypermetabolic right conglomerate  supraclavicular mass. This is accompanied by accentuated metabolic activity in the palatine tonsillar pillars, a hypermetabolic right buccinator lymph node, hypermetabolic thoracic lymph nodes including a mildly enlarged AP window lymph node and other upper normal size but mildly hypermetabolic hilar and infrahilar nodes. Appearance compatible with malignancy such as lymphoma or adenocarcinoma.   Of note, pt presents to the office today accompanied with his daughters. He notes that he has never been sick his whole life and just started taking medications this past week. Daughter reports that Dr. Constance Holster didn't see anything on the patient visit in his throat and that the only suspicious area was to his right lower neck. He states that he is a [place] and was born in [place]. Denies radiation exposures.  Notably ROS is negative for numbness, pain, tingling, or weakness to right arm. All other systems are negative except as noted in the HPI and PMH.   Swallowing issues, if any: No  Weight Changes: No  Pain status: mild pain at the biopsied site  Other symptoms: No  Tobacco history, if any: former smoker of cigarettes  ETOH abuse, if any: No; drinks socially  Prior cancers, if any: No  PREVIOUS RADIATION THERAPY: No  PAST MEDICAL HISTORY:  has a past medical history of Medical history non-contributory.    PAST SURGICAL HISTORY: Past Surgical History:  Procedure Laterality Date  . CATARACT EXTRACTION    . COLONOSCOPY  02/2016  . LYMPH NODE BIOPSY Right 07/13/2017   Procedure: OPEN NECK LYMPH NODE BIOPSY RIGHT SIDE;  Surgeon: Izora Gala, MD;  Location: [facility];  Service: ENT;  Laterality: Right;    FAMILY HISTORY: family history includes  Cancer in his father.  SOCIAL HISTORY:  reports that he quit smoking about 30 years ago. He has never used smokeless tobacco. He reports that he drinks alcohol. He reports that he does not use drugs.  ALLERGIES: Patient has no  known allergies.  MEDICATIONS:  Current Outpatient Prescriptions  Medication Sig Dispense Refill  . aspirin EC 81 MG tablet Take 81 mg by mouth daily.    . cephALEXin (KEFLEX) 500 MG capsule Take 1 capsule (500 mg total) by mouth 3 (three) times daily. 15 capsule 0   No current facility-administered medications for this encounter.     REVIEW OF SYSTEMS:  A 10+ POINT REVIEW OF SYSTEMS WAS OBTAINED including neurology, dermatology, psychiatry, cardiac, respiratory, lymph, extremities, GI, GU, Musculoskeletal, constitutional,   HEENT.  All pertinent positives are noted in the HPI.  All others are negative.   PHYSICAL EXAM:  height is 5\' 8"  (1.727 m) and weight is 167 lb 3.2 oz (75.8 kg). His temperature is 97.7 F (36.5 C). His blood pressure is 119/89 and his pulse is 76. His oxygen saturation is 98%.   General: Alert and oriented, in no acute distress HEENT: Head is normocephalic. Extraocular movements are intact. Oropharynx is notable for 2 remaining teeth in the left jaw. Otherwise edentulous. Oropharynx clear.  Neck: Neck is notable for no pre or post auricular masses. Dominant right neck mass that extends from level 2 to level 4 and is approximately 9 cm in greatest dimension. Slight erythema of skin overlying the mass. No other obvious masses are appreciated on exam.  Heart: Regular in rate and rhythm with no murmurs, rubs, or gallops. Chest: Clear to auscultation bilaterally, with no rhonchi, wheezes, or rales. Abdomen: Soft, nontender, nondistended, with no rigidity or guarding. Extremities: No cyanosis or edema to extremities.  Lymphatics: see Neck Exam Skin: No concerning lesions. Musculoskeletal: symmetric strength and muscle tone throughout. Notably strength and sensation intact and symmetric in arms.  Neurologic: Cranial nerves II through XII are grossly intact. No obvious focalities. Speech is fluent. Coordination is intact.  Psychiatric: Judgment and insight are intact. Affect  is appropriate.   ECOG = 0   LABORATORY DATA:  Lab Results  Component Value Date   WBC 6.6 07/17/2017   HGB 15.4 07/17/2017   HCT 46.3 07/17/2017   MCV 95.5 07/17/2017   PLT 247 07/17/2017   CMP     Component Value Date/Time   NA 140 07/17/2017 1046   K 4.3 07/17/2017 1046   CO2 29 07/17/2017 1046   GLUCOSE 105 07/17/2017 1046   BUN 14.5 07/17/2017 1046   CREATININE 0.9 07/17/2017 1046   CALCIUM 9.6 07/17/2017 1046   PROT 7.8 07/17/2017 1046   ALBUMIN 3.9 07/17/2017 1046   AST 18 07/17/2017 1046   ALT 14 07/17/2017 1046   ALKPHOS 71 07/17/2017 1046   BILITOT 0.43 07/17/2017 1046      No results found for: TSH     RADIOGRAPHY: Nm Pet Image Initial (pi) Skull Base To Thigh  Result Date: 06/30/2017 CLINICAL DATA:  Initial treatment strategy for right supraclavicular mass. EXAM: NUCLEAR MEDICINE PET SKULL BASE TO THIGH TECHNIQUE: 8.0 mCi F-18 FDG was injected intravenously. Full-ring PET imaging was performed from the skull base to thigh after the radiotracer. CT data was obtained and used for attenuation correction and anatomic localization. FASTING BLOOD GLUCOSE:  Value: 108 mg/dl COMPARISON:  CT scan dated 06/15/2017 FINDINGS: NECK Dominant 7.6 by 4.0 cm right supraclavicular mass, maximum SUV 29.5. A  0.8 cm right buccinator node has a maximum SUV of 10.1, compatible with malignancy. This abuts but does not appear to be definitively arising from the right prostate lobe. Smaller lymph nodes are present along the periphery of this conglomerate. There is accentuated metabolic activity in the palatine tonsillar pillars, maximum SUV 11.6 on the right and 8.3 on the left. Chronic left maxillary sinusitis. CHEST Hypermetabolic AP window, right hilar, and bilateral infrahilar lymph nodes. Index AP window lymph node 1.4 cm in short axis on image 70/4 with maximum SUV 4.9. Mild atelectasis or scarring in the left lower lobe. Mild peripheral interstitial accentuation in the lungs.  Calcified pleural plaques anteriorly in the right lung. ABDOMEN/PELVIS No abnormal hypermetabolic activity within the liver, pancreas, adrenal glands, or spleen. No hypermetabolic lymph nodes in the abdomen or pelvis. Aortoiliac atherosclerotic vascular disease.  Left adrenal adenoma. SKELETON No focal hypermetabolic activity to suggest skeletal metastasis. IMPRESSION: 1. Highly hypermetabolic right conglomerate supraclavicular mass. This is accompanied by accentuated metabolic activity in the palatine tonsillar pillars, a hypermetabolic right buccinator lymph node, hypermetabolic thoracic lymph nodes including a mildly enlarged AP window lymph node and other upper normal size but mildly hypermetabolic hilar and infrahilar nodes. Appearance compatible with malignancy such as lymphoma or adenocarcinoma, tissue diagnosis is recommended and probably most readily accessible from the large right supraclavicular mass. 2.  Aortic Atherosclerosis (ICD10-I70.0). 3. Left adrenal adenoma. 4. Chronic left maxillary sinusitis. Electronically Signed   By: Van Clines M.D.   On: 06/30/2017 11:28      IMPRESSION/PLAN: This is a delightful patient with lymphoma. I do recommend chemotherapy first and then radiotherapy treatment to start at least 3 weeks following chemotherapy with about 4 weeks of ISRT radiotherapy for this patient. We will hold on radiotherapy until after patient has his appointment with Dr. Lebron Conners today (07/17/17).  We discussed the potential risks, benefits, and side effects of radiotherapy. We talked in detail about acute and late effects. We discussed that some of the most bothersome acute effects may be mucositis, dysgeusia, salivary changes, skin irritation, hair loss, dehydration, weight loss and fatigue. We talked about late effects which include but are not necessarily limited to dysphagia, feeding tube needs, hypothyroidism, nerve injury, spinal cord injury, xerostomia. No guarantees of  treatment were given. A consent form was signed and placed in the patient's medical record. The patient is enthusiastic about proceeding with treatment. I look forward to participating in the patient's care.     We also discussed that the treatment of head and neck cancer is a multidisciplinary process to maximize treatment outcomes and quality of life. For this reasons the following referrals have been or will be made:  Pt will keep appointment with Medical oncologist, Dr. Lebron Conners today (07/17/17) to discuss chemotherapy.  Later, as radiotherapy nears, will refer to [facility] to see the following:   Nutritionist for nutrition support during and after treatment.   Speech language pathology for swallowing and/or speech therapy.   Social work for social support.    Physical therapy due to risk of lymphedema in neck and deconditioning.  Today I will ordered Baseline lab: TSH. __________________________________________   Eppie Gibson, MD   This document serves as a record of services personally performed by Eppie Gibson, MD. It was created on her behalf by Steva Colder, a trained medical scribe. The creation of this record is based on the scribe's personal observations and the provider's statements to them. This document has been checked and approved by the attending  provider.  

## 2017-07-17 NOTE — Progress Notes (Signed)
Oncology Nurse Navigator Documentation  Met with Mr. [place]over and his dtrs with Dr. Lebron Conners for further discussion of diagnosis and proposed treatment. They voiced understanding of Dr. Clydene Laming explanation of diagnosis as aggressive non-Hodgkins diffuse large B-cell lymphoma with 75% cure rate. They voiced understanding bone marrow bx will be scheduled for further staging, [place] of heart will be ordered to assess cardiac condition. I provided PAC educational handout in support of PAC discussion, they voiced understanding PAC will be scheduled with [facility] IR. They understand to contact me with questions/concerns moving forward.  Gayleen Orem, RN, BSN, [place] [facility] Oncology Nurse [place] at [place] [phone]

## 2017-07-17 NOTE — Progress Notes (Addendum)
Oncology Nurse Navigator Documentation  Met with Todd Bell during initial consult with Dr. Isidore Moos.  He was accompanied by his 2 daughters.  Todd Bell had his initial MedOnc consult with Dr. Lebron Conners 06/30/17 which his wife attended.    1. Introduced myself to daughters as Health visitor, explained my role as a member of the Care Team, provided them my contact information.   2. Provided introductory explanation of radiation treatment including SIM planning and purpose of Aquaplast head and shoulder mask, showed them example.   3. They voiced understanding of:  Lymphoma diffuse large B-cell dx as informed by Dr. Isidore Moos.  Plan for RT ca 2-3 weeks s/p chemotherapy, involving ca 4 weeks M-F tmt. 4. Provided a tour of SIM and Tomo areas, explained treatment and arrival procedures. 5. I encouraged them to contact me with questions/concerns as treatments/procedures begin.  They verbalized understanding of information provided.    Todd Orem, RN, BSN, [place] [facility] Nurse [place] at [place] [phone]

## 2017-07-18 LAB — HEPATITIS C ANTIBODY (REFLEX)

## 2017-07-18 LAB — HEPATITIS B SURFACE ANTIBODY,QUALITATIVE: Hep B Surface Ab, Qual: NONREACTIVE

## 2017-07-18 LAB — HEPATITIS B CORE ANTIBODY, TOTAL: Hep B Core Ab, Tot: NEGATIVE

## 2017-07-18 LAB — BETA 2 MICROGLOBULIN, SERUM: BETA 2: 1.4 mg/L (ref 0.6–2.4)

## 2017-07-18 LAB — HEPATITIS B SURFACE ANTIGEN: HBsAg Screen: NEGATIVE

## 2017-07-20 NOTE — Assessment & Plan Note (Signed)
71-year-old male with diagnosis of activated B-cell diffuse large B-cell lymphoma of the lymph nodes of the right side of the neck. Currently available staging information suggests presence of stage II bulky disease.  Diffuse large B-cell lymphoma is an aggressive malignancy with multiple available treatments and potential for curative-approach therapy. You have past medical history reveals no history of cardiovascular disease. Patient has had excellent performance status and remains very active for his age.  It is my opinion that we do not need to reduce the intensity of therapy upfront based on pre-existent conditions. With that in mind, we'll commence workup and staging completion as outlined below. Subsequently, I intend to start patient on curative-intense therapy with R CHOP regimen.  Plan: --Labs today as outlined below --Start allopurinol for hyperuricemia prophylaxis --ECHO --IR consult for bone marrow biopsy and Infuse-a-Port placement --Return to clinic one week after bone placement to initiate therapy with R CHOP.  Voice recognition software was used and creation of this note. Despite my best effort at editing the text, some misspelling/errors may have occurred. 

## 2017-07-20 NOTE — Progress Notes (Signed)
[facility] Follow-up Visit:  Assessment: Diffuse large B-cell lymphoma of lymph nodes of neck ([name]) 71 year old male with diagnosis of activated B-cell diffuse large B-cell lymphoma of the lymph nodes of the right side of the neck. Currently available staging information suggests presence of stage II bulky disease.  Diffuse large B-cell lymphoma is an aggressive malignancy with multiple available treatments and potential for curative-approach therapy. You have past medical history reveals no history of cardiovascular disease. Patient has had excellent performance status and remains very active for his age.  It is my opinion that we do not need to reduce the intensity of therapy upfront based on pre-existent conditions. With that in mind, we'll commence workup and staging completion as outlined below. Subsequently, I intend to start patient on curative-intense therapy with R CHOP regimen.  Plan: --Labs today as outlined below --Start allopurinol for hyperuricemia prophylaxis --ECHO --IR consult for bone marrow biopsy and Infuse-a-Port placement --Return to clinic one week after bone placement to initiate therapy with R CHOP.  Voice recognition software was used and creation of this note. Despite my best effort at editing the text, some misspelling/errors may have occurred.   Orders Placed This Encounter  Procedures  . CT BONE MARROW BIOPSY & ASPIRATION    Standing Status:   Future    Standing Expiration Date:   10/17/2018    Order Specific Question:   Reason for Exam (SYMPTOM  OR DIAGNOSIS REQUIRED)    Answer:   Diagnosis of diffuse large B-cell lymphoma, staging assessment.    Order Specific Question:   Preferred imaging location?    Answer:   [facility]    Order Specific Question:   Radiology Contrast Protocol - do NOT remove file path    Answer:   \\[email or web address]  . CT Biopsy    Standing Status:   Future    Standing  Expiration Date:   07/17/2018    Order Specific Question:   Lab orders requested (DO NOT place separate lab orders, these will be automatically ordered during procedure specimen collection):    Answer:   Cytology - Non Pap    Comments:   B-cell lymphoma FISH    Order Specific Question:   Lab orders requested (DO NOT place separate lab orders, these will be automatically ordered during procedure specimen collection):    Answer:   Surgical Pathology    Order Specific Question:   Lab orders requested (DO NOT place separate lab orders, these will be automatically ordered during procedure specimen collection):    Answer:   Other    Order Specific Question:   Reason for Exam (SYMPTOM  OR DIAGNOSIS REQUIRED)    Answer:   New diagnosis of diffuse large B-cell lymphoma, staging assessment    Order Specific Question:   Preferred imaging location?    Answer:   [facility]    Order Specific Question:   Radiology Contrast Protocol - do NOT remove file path    Answer:   \\[email or web address]  . IR FLUORO GUIDE PORT INSERTION LEFT    Standing Status:   Future    Standing Expiration Date:   09/16/2018    Order Specific Question:   Reason for Exam (SYMPTOM  OR DIAGNOSIS REQUIRED)    Answer:   Patient will be needing systemic chemotherapy for his diffuse large cell lymphoma diagnosis.    Order Specific Question:   Preferred Imaging Location?    Answer:   [place]  with Differential    Standing Status:   Future    Number of Occurrences:   1    Standing Expiration Date:   07/17/2018  . Comprehensive metabolic panel    Standing Status:   Future    Number of Occurrences:   1    Standing Expiration Date:   07/17/2018  . Lactate dehydrogenase (LDH)    Standing Status:   Future    Number of Occurrences:   1    Standing Expiration Date:   07/17/2018  . Uric acid    Standing Status:   Future    Number of Occurrences:   1    Standing Expiration Date:   07/17/2018   . Beta 2 microglobulin, serum    Standing Status:   Future    Number of Occurrences:   1    Standing Expiration Date:   07/17/2018  . Hepatitis B surface antibody    Standing Status:   Future    Number of Occurrences:   1    Standing Expiration Date:   07/17/2018  . Hepatitis B surface antigen    Standing Status:   Future    Number of Occurrences:   1    Standing Expiration Date:   07/17/2018  . Hepatitis B core antibody, total    Standing Status:   Future    Number of Occurrences:   1    Standing Expiration Date:   07/17/2018  . Hepatitis C antibody (reflex if positive)    Standing Status:   Future    Number of Occurrences:   1    Standing Expiration Date:   07/17/2018  . ECHOCARDIOGRAM COMPLETE    New diagnosis of diffuse large B-cell lymphoma, anticipating needed to use anthracycline chemotherapy.    Standing Status:   Future    Standing Expiration Date:   10/17/2018    Order Specific Question:   Where should this test be performed    Answer:   [facility]    Order Specific Question:   Perflutren DEFINITY (image enhancing agent) should be administered unless hypersensitivity or allergy exist    Answer:   Administer Perflutren    Order Specific Question:   Expected Date:    Answer:   1 week    Cancer Staging Diffuse large B-cell lymphoma of lymph nodes of neck (H[facility]) Staging form: Hodgkin and Non-Hodgkin Lymphoma, AJCC 8th Edition - Clinical stage from 07/13/2017: Stage II bulky (Diffuse large B-cell lymphoma) - Signed by Ardath Sax, MD on 07/20/2017   All questions were answered.  . The patient knows to call the clinic with any problems, questions or concerns.  This note was electronically signed.    History of Presenting Illness Todd Bell is a  71 y.o. [name] male follwed in the [place] for diagnosis of diffuse large B-cell lymphoma.  Please see oncologic history below for details of the diagnosis. Patient has initially discovered asymmetry in  his neck with visible mass earlier in July. He was seen by his primary care provider and subsequently directly to be evaluated by ENT. Patient underwent initially ultrasound of the neck demonstrating a large mass, subsequently he underwent CT of the neck on 06/15/17 demonstrating normal laryngeal, pharyngeal, salivary structures and a 5.0 cm supraclavicular mass on the right side. CT of the chest obtained the same time demonstrated mass, but no lymphadenopathy in the mediastinum, hilar structures, and a 0.4 cm right upper lobe nodule and a 0.5 cm left upper  lobe nodule. Patient underwent an FNA on 06/17/17 which demonstrated presence of malignant cells that could not be further characterized due to insufficient sample.  Patient denies any significant symptoms in association with this mass. He denies any fevers, chills, night sweats. No significant weight loss. No changes to her activities of daily living or activity tolerance. Denies changes to his appetite. Denies chest pain, shortness of breath or cough. His nausea, early satiety, abdominal pain, diarrhea, or constipation. No dysuria or hematuria. Denies any cutaneous changes. Denies any vision changes, hearing changes, swallowing difficulties, any focal weakness or sensory deficits in the face or extremities.  Since the last visit to the clinic, patient underwent biopsy of the right cervical mass which demonstrated presence of diffuse large B-cell lymphoma. Patient returns to the clinic to discuss management strategy. He denies any new complaints since last visit to the clinic.   Oncological/hematological History:   Diffuse large B-cell lymphoma of lymph nodes of neck ([place])   06/15/2017 Imaging    CT Neck/Chest: Normal laryngeal, pharyngeal, salivary structures and a 5.0 cm supraclavicular mass on the right side.No lymphadenopathy in the mediastinum, hilar structures, and a 0.4 cm right upper lobe nodule and a 0.5 cm left upper lobe nodule.        06/30/2017 Imaging    PET-CT: Highly hypermetabolic right conglomerate supraclavicular mass. This is accompanied by accentuated metabolic activity in the palatine tonsillar pillars, a hypermetabolic right buccinator lymph node, hypermetabolic thoracic lymph nodes including a mildly enlarged AP window lymph node and other upper normal size but mildly hypermetabolic hilar and infrahilar nodes. Appearance compatible with malignancy such as lymphoma or adenocarcinoma      07/13/2017 Pathology Results    Rt Neck mass core bx: Sheets of large lymphocytes discernible lymph node structure, consistent with diffuse large B-cell lymphoma. IHC -- positive for LCA, CD20, CD79a, PAX-5, BCL-6 & negative for CD10, CD34, BCL-2, CD30, CD138, kappa or lambda light chains      07/17/2017 Initial Diagnosis    Diffuse large B-cell lymphoma of lymph nodes of neck (H[facility])      Medical History: Past Medical History:  Diagnosis Date  . Medical history non-contributory     Surgical History: Past Surgical History:  Procedure Laterality Date  . CATARACT EXTRACTION    . COLONOSCOPY  02/2016  . LYMPH NODE BIOPSY Right 07/13/2017   Procedure: OPEN NECK LYMPH NODE BIOPSY RIGHT SIDE;  Surgeon: Izora Gala, MD;  Location: [facility];  Service: ENT;  Laterality: Right;    Family History: Family History  Problem Relation Age of Onset  . Cancer Father        Pancreatic    Social History: Social History   Social History  . Marital status: Married    Spouse name: N/A  . Number of children: N/A  . Years of education: N/A   Occupational History  . Not on file.   Social History Main Topics  . Smoking status: Former Smoker    Quit date: 1988  . Smokeless tobacco: Never Used     Comment: he was a social drinker  . Alcohol use Yes     Comment: occasional  . Drug use: No  . Sexual activity: Not on file   Other Topics Concern  . Not on file   Social History Narrative  . No narrative on file     Allergies: No Known Allergies  Medications:  Current Outpatient Prescriptions  Medication Sig Dispense Refill  . aspirin EC  81 MG tablet Take 81 mg by mouth daily.    . cephALEXin (KEFLEX) 500 MG capsule Take 1 capsule (500 mg total) by mouth 3 (three) times daily. 15 capsule 0   No current facility-administered medications for this visit.     Review of Systems: Review of Systems  All other systems reviewed and are negative.    PHYSICAL EXAMINATION Blood pressure (!) 143/81, pulse (!) 56, temperature 97.9 F (36.6 C), temperature source Oral, resp. rate 18, height 5' 8"  (1.727 m), weight 167 lb 1.6 oz (75.8 kg), SpO2 99 %.  ECOG PERFORMANCE STATUS: 1 - Symptomatic but completely ambulatory  Physical Exam  Constitutional: He is oriented to person, place, and time and well-developed, well-nourished, and in no distress. Vital signs are normal. He appears not malnourished, not dehydrated and not jaundiced. He does not have a sickly appearance. No distress.  HENT:  Mouth/Throat: Uvula is midline, oropharynx is clear and moist and mucous membranes are normal. Mucous membranes are not pale, not dry and not cyanotic. He has dentures. Normal dentition.  Eyes: Pupils are equal, round, and reactive to light. Conjunctivae and EOM are normal. No scleral icterus.  Neck: No JVD present. Carotid bruit is not present.  Large mass is visibly present over the middle and lower aspects of the right neck. It appears to be firm to palpation with a regular surface of hard texture. It appears to be adherent to the underlying structures.  Cardiovascular: Normal rate, regular rhythm and normal heart sounds.   No murmur heard. Pulmonary/Chest: Effort normal and breath sounds normal. He has no decreased breath sounds. He has no wheezes. He has no rhonchi. He has no rales.  Abdominal: Soft. Normal appearance and normal aorta. There is no hepatosplenomegaly. There is no tenderness.  Lymphadenopathy:        Head (right side): No submental, no submandibular, no tonsillar and no occipital adenopathy present.       Head (left side): No submental, no submandibular, no tonsillar and no occipital adenopathy present.    He has cervical adenopathy.       Right cervical: Superficial cervical adenopathy present. No posterior cervical adenopathy present.      Left cervical: No superficial cervical, no deep cervical and no posterior cervical adenopathy present.    He has no axillary adenopathy.       Right: Supraclavicular adenopathy present. No inguinal adenopathy present.       Left: No inguinal and no supraclavicular adenopathy present.  Neurological: He is alert and oriented to person, place, and time. He has normal sensation, normal strength, normal reflexes and intact cranial nerves.  Skin: He is not diaphoretic.     LABORATORY DATA: I have personally reviewed the data as listed: Appointment on 07/17/2017  Component Date Value Ref Range Status  . WBC 07/17/2017 6.6  4.0 - 10.3 10e3/uL Final  . NEUT# 07/17/2017 4.1  1.5 - 6.5 10e3/uL Final  . HGB 07/17/2017 15.4  13.0 - 17.1 g/dL Final  . HCT 07/17/2017 46.3  38.4 - 49.9 % Final  . Platelets 07/17/2017 247  140 - 400 10e3/uL Final  . MCV 07/17/2017 95.5  79.3 - 98.0 fL Final  . MCH 07/17/2017 31.8  27.2 - 33.4 pg Final  . MCHC 07/17/2017 33.3  32.0 - 36.0 g/dL Final  . RBC 07/17/2017 4.85  4.20 - 5.82 10e6/uL Final  . RDW 07/17/2017 13.9  11.0 - 14.6 % Final  . lymph# 07/17/2017 1.1  0.9 - 3.3  10e3/uL Final  . MONO# 07/17/2017 0.7  0.1 - 0.9 10e3/uL Final  . Eosinophils Absolute 07/17/2017 0.7* 0.0 - 0.5 10e3/uL Final  . Basophils Absolute 07/17/2017 0.0  0.0 - 0.1 10e3/uL Final  . NEUT% 07/17/2017 62.3  39.0 - 75.0 % Final  . LYMPH% 07/17/2017 16.9  14.0 - 49.0 % Final  . MONO% 07/17/2017 9.9  0.0 - 14.0 % Final  . EOS% 07/17/2017 10.5* 0.0 - 7.0 % Final  . BASO% 07/17/2017 0.4  0.0 - 2.0 % Final  . Sodium 07/17/2017 140  136 - 145 mEq/L  Final  . Potassium 07/17/2017 4.3  3.5 - 5.1 mEq/L Final  . Chloride 07/17/2017 104  98 - 109 mEq/L Final  . CO2 07/17/2017 29  22 - 29 mEq/L Final  . Glucose 07/17/2017 105  70 - 140 mg/dl Final   Glucose reference range is for nonfasting patients. Fasting glucose reference range is 70- 100.  [place] Kitchen BUN 07/17/2017 14.5  7.0 - 26.0 mg/dL Final  . Creatinine 07/17/2017 0.9  0.7 - 1.3 mg/dL Final  . Total Bilirubin 07/17/2017 0.43  0.20 - 1.20 mg/dL Final  . Alkaline Phosphatase 07/17/2017 71  40 - 150 U/L Final  . AST 07/17/2017 18  5 - 34 U/L Final  . ALT 07/17/2017 14  0 - 55 U/L Final  . Total Protein 07/17/2017 7.8  6.4 - 8.3 g/dL Final  . Albumin 07/17/2017 3.9  3.5 - 5.0 g/dL Final  . Calcium 07/17/2017 9.6  8.4 - 10.4 mg/dL Final  . Anion Gap 07/17/2017 7  3 - 11 mEq/L Final  . EGFR 07/17/2017 85* >90 ml/min/1.73 m2 Final   eGFR is calculated using the CKD-EPI Creatinine Equation (2009)  . LDH 07/17/2017 247* 125 - 245 U/L Final  . Uric Acid, Serum 07/17/2017 6.0  2.6 - 7.4 mg/dl Final  . Beta-2 07/17/2017 1.4  0.6 - 2.4 mg/L Final   [employer] Immulite 2000 Immunochemiluminometric assay (ICMA)  . Hep B Surface Ab, Qual 07/17/2017 Non Reactive   Final   Comment:                              Non Reactive: Inconsistent with immunity,                                            less than 10 mIU/mL                              Reactive:     Consistent with immunity,                                            greater than 9.9 mIU/mL   . HBsAg Screen 07/17/2017 Negative  Negative Final  . Hep B Core Ab, Tot 07/17/2017 Negative  Negative Final  . HCV Ab 07/17/2017 <0.1  0.0 - 0.9 s/co ratio Final  . Comment: 07/17/2017 Comment   Final   Comment: Non reactive HCV antibody screen is consistent with no HCV infection, unless recent infection is suspected or other evidence exists to indicate HCV infection.        Ardath Sax, MD

## 2017-07-23 ENCOUNTER — Ambulatory Visit
Admission: RE | Admit: 2017-07-23 | Discharge: 2017-07-23 | Disposition: A | Discharge status: Home / Self Care | Payer: Medicare Other | Source: Ambulatory Visit | Attending: Radiation Oncology | Admitting: Radiation Oncology

## 2017-07-23 ENCOUNTER — Ambulatory Visit (HOSPITAL_COMMUNITY)
Admission: RE | Admit: 2017-07-23 | Discharge: 2017-07-23 | Disposition: A | Discharge status: Home / Self Care | Payer: Medicare Other | Source: Ambulatory Visit | Attending: Hematology and Oncology | Admitting: Hematology and Oncology

## 2017-07-23 DIAGNOSIS — Z1329 Encounter for screening for other suspected endocrine disorder: Secondary | ICD-10-CM

## 2017-07-23 DIAGNOSIS — I083 Combined rheumatic disorders of mitral, aortic and tricuspid valves: Secondary | ICD-10-CM | POA: Diagnosis not present

## 2017-07-23 DIAGNOSIS — C8331 Diffuse large B-cell lymphoma, lymph nodes of head, face, and neck: Secondary | ICD-10-CM | POA: Diagnosis not present

## 2017-07-23 DIAGNOSIS — I371 Nonrheumatic pulmonary valve insufficiency: Secondary | ICD-10-CM | POA: Diagnosis not present

## 2017-07-23 DIAGNOSIS — R5381 Other malaise: Secondary | ICD-10-CM

## 2017-07-23 LAB — TSH: TSH: 2.431 m(IU)/L (ref 0.320–4.118)

## 2017-07-23 NOTE — Progress Notes (Signed)
  Echocardiogram 2D Echocardiogram has been performed.  Todd Bell M 07/23/2017, 9:40 AM

## 2017-07-28 ENCOUNTER — Other Ambulatory Visit: Payer: Self-pay | Admitting: Radiology

## 2017-07-28 ENCOUNTER — Other Ambulatory Visit: Payer: Self-pay | Admitting: Student

## 2017-07-29 ENCOUNTER — Encounter (HOSPITAL_COMMUNITY): Payer: Self-pay

## 2017-07-29 ENCOUNTER — Ambulatory Visit (HOSPITAL_COMMUNITY)
Admission: RE | Admit: 2017-07-29 | Discharge: 2017-07-29 | Disposition: A | Discharge status: Home / Self Care | Payer: Medicare Other | Source: Ambulatory Visit | Attending: Interventional Radiology | Admitting: Interventional Radiology

## 2017-07-29 ENCOUNTER — Other Ambulatory Visit: Payer: Self-pay | Admitting: Hematology and Oncology

## 2017-07-29 ENCOUNTER — Ambulatory Visit (HOSPITAL_COMMUNITY)
Admission: RE | Admit: 2017-07-29 | Discharge: 2017-07-29 | Disposition: A | Discharge status: Home / Self Care | Payer: Medicare Other | Source: Ambulatory Visit | Attending: Hematology and Oncology | Admitting: Hematology and Oncology

## 2017-07-29 DIAGNOSIS — Z800 Family history of malignant neoplasm of digestive organs: Secondary | ICD-10-CM | POA: Insufficient documentation

## 2017-07-29 DIAGNOSIS — Z5111 Encounter for antineoplastic chemotherapy: Secondary | ICD-10-CM | POA: Diagnosis not present

## 2017-07-29 DIAGNOSIS — Z9889 Other specified postprocedural states: Secondary | ICD-10-CM | POA: Insufficient documentation

## 2017-07-29 DIAGNOSIS — C8331 Diffuse large B-cell lymphoma, lymph nodes of head, face, and neck: Secondary | ICD-10-CM

## 2017-07-29 DIAGNOSIS — Z7982 Long term (current) use of aspirin: Secondary | ICD-10-CM | POA: Diagnosis not present

## 2017-07-29 DIAGNOSIS — Z87891 Personal history of nicotine dependence: Secondary | ICD-10-CM | POA: Diagnosis not present

## 2017-07-29 DIAGNOSIS — Z79899 Other long term (current) drug therapy: Secondary | ICD-10-CM | POA: Diagnosis not present

## 2017-07-29 DIAGNOSIS — D7589 Other specified diseases of blood and blood-forming organs: Secondary | ICD-10-CM | POA: Diagnosis not present

## 2017-07-29 DIAGNOSIS — D721 Eosinophilia: Secondary | ICD-10-CM | POA: Diagnosis not present

## 2017-07-29 DIAGNOSIS — C8590 Non-Hodgkin lymphoma, unspecified, unspecified site: Secondary | ICD-10-CM | POA: Diagnosis not present

## 2017-07-29 HISTORY — PX: IR US GUIDE VASC ACCESS LEFT: IMG2389

## 2017-07-29 HISTORY — PX: IR IMAGING GUIDED PORT INSERTION: IMG5740

## 2017-07-29 LAB — CBC WITH DIFFERENTIAL/PLATELET
Basophils Absolute: 0 10*3/uL (ref 0.0–0.1)
Basophils Relative: 0 %
Eosinophils Absolute: 0.9 10*3/uL — ABNORMAL HIGH (ref 0.0–0.7)
Eosinophils Relative: 12 %
HEMATOCRIT: 41.9 % (ref 39.0–52.0)
HEMOGLOBIN: 14.1 g/dL (ref 13.0–17.0)
Lymphocytes Relative: 19 %
Lymphs Abs: 1.4 10*3/uL (ref 0.7–4.0)
MCH: 31.8 pg (ref 26.0–34.0)
MCHC: 33.7 g/dL (ref 30.0–36.0)
MCV: 94.4 fL (ref 78.0–100.0)
MONOS PCT: 12 %
Monocytes Absolute: 0.9 10*3/uL (ref 0.1–1.0)
NEUTROS ABS: 4.1 10*3/uL (ref 1.7–7.7)
NEUTROS PCT: 57 %
Platelets: 235 10*3/uL (ref 150–400)
RBC: 4.44 MIL/uL (ref 4.22–5.81)
RDW: 13.7 % (ref 11.5–15.5)
WBC: 7.2 10*3/uL (ref 4.0–10.5)

## 2017-07-29 LAB — PROTIME-INR
INR: 1.02
Prothrombin Time: 13.4 seconds (ref 11.4–15.2)

## 2017-07-29 LAB — APTT: APTT: 28 s (ref 24–36)

## 2017-07-29 MED ORDER — FENTANYL CITRATE (PF) 100 MCG/2ML IJ SOLN
INTRAMUSCULAR | Status: AC | PRN
  Administered 2017-07-29: 50 ug via INTRAVENOUS

## 2017-07-29 MED ORDER — CEFAZOLIN SODIUM-DEXTROSE 2-4 GM/100ML-% IV SOLN
2.0000 g | INTRAVENOUS | Status: AC
  Administered 2017-07-29: 2 g via INTRAVENOUS

## 2017-07-29 MED ORDER — MIDAZOLAM HCL 2 MG/2ML IJ SOLN
INTRAMUSCULAR | Status: AC
  Filled 2017-07-29: qty 6

## 2017-07-29 MED ORDER — NALOXONE HCL 0.4 MG/ML IJ SOLN
INTRAMUSCULAR | Status: AC
  Filled 2017-07-29: qty 1

## 2017-07-29 MED ORDER — LIDOCAINE HCL 1 % IJ SOLN
INTRAMUSCULAR | Status: AC | PRN
  Administered 2017-07-29: 20 mL

## 2017-07-29 MED ORDER — HEPARIN SOD (PORK) LOCK FLUSH 100 UNIT/ML IV SOLN
INTRAVENOUS | Status: AC
  Administered 2017-07-29: 11:00:00
  Filled 2017-07-29: qty 5

## 2017-07-29 MED ORDER — FENTANYL CITRATE (PF) 100 MCG/2ML IJ SOLN
INTRAMUSCULAR | Status: AC | PRN
  Administered 2017-07-29 (×2): 25 ug via INTRAVENOUS

## 2017-07-29 MED ORDER — LIDOCAINE HCL 1 % IJ SOLN: 10.0000 mL | Freq: Once | INTRAMUSCULAR | Status: DC

## 2017-07-29 MED ORDER — FENTANYL CITRATE (PF) 100 MCG/2ML IJ SOLN
INTRAMUSCULAR | Status: AC
  Filled 2017-07-29: qty 6

## 2017-07-29 MED ORDER — MIDAZOLAM HCL 2 MG/2ML IJ SOLN
INTRAMUSCULAR | Status: AC | PRN
  Administered 2017-07-29: 1 mg via INTRAVENOUS

## 2017-07-29 MED ORDER — CEFAZOLIN SODIUM-DEXTROSE 2-4 GM/100ML-% IV SOLN
INTRAVENOUS | Status: AC
  Filled 2017-07-29: qty 100

## 2017-07-29 MED ORDER — FLUMAZENIL 0.5 MG/5ML IV SOLN
INTRAVENOUS | Status: AC
  Filled 2017-07-29: qty 5

## 2017-07-29 MED ORDER — LIDOCAINE-EPINEPHRINE (PF) 2 %-1:200000 IJ SOLN
INTRAMUSCULAR | Status: AC
  Filled 2017-07-29: qty 20

## 2017-07-29 MED ORDER — SODIUM CHLORIDE 0.9 % IV SOLN
INTRAVENOUS | Status: DC
Start: 2017-07-29 — End: 2017-07-30
  Administered 2017-07-29: 07:00:00 via INTRAVENOUS

## 2017-07-29 MED ORDER — LIDOCAINE HCL 1 % IJ SOLN
10.0000 mL | Freq: Once | INTRAMUSCULAR | Status: AC
  Administered 2017-07-29: 10 mL

## 2017-07-29 NOTE — Sedation Documentation (Signed)
Patient is resting comfortably. 

## 2017-07-29 NOTE — Discharge Instructions (Signed)
Moderate Conscious Sedation, Adult, Care After °These instructions provide you with information about caring for yourself after your procedure. Your health care provider may also give you more specific instructions. Your treatment has been planned according to current medical practices, but problems sometimes occur. Call your health care provider if you have any problems or questions after your procedure. °What can I expect after the procedure? °After your procedure, it is common: °· To feel sleepy for several hours. °· To feel clumsy and have poor balance for several hours. °· To have poor judgment for several hours. °· To vomit if you eat too soon. ° °Follow these instructions at home: °For at least 24 hours after the procedure: ° °· Do not: °? Participate in activities where you could fall or become injured. °? Drive. °? Use heavy machinery. °? Drink alcohol. °? Take sleeping pills or medicines that cause drowsiness. °? Make important decisions or sign legal documents. °? Take care of children on your own. °· Rest. °Eating and drinking °· Follow the diet recommended by your health care provider. °· If you vomit: °? Drink water, juice, or soup when you can drink without vomiting. °? Make sure you have little or no nausea before eating solid foods. °General instructions °· Have a responsible adult stay with you until you are awake and alert. °· Take over-the-counter and prescription medicines only as told by your health care provider. °· If you smoke, do not smoke without supervision. °· Keep all follow-up visits as told by your health care provider. This is important. °Contact a health care provider if: °· You keep feeling nauseous or you keep vomiting. °· You feel light-headed. °· You develop a rash. °· You have a fever. °Get help right away if: °· You have trouble breathing. °This information is not intended to replace advice given to you by your health care provider. Make sure you discuss any questions you have  with your health care provider. °Document Released: 09/14/2013 Document Revised: 04/28/2016 Document Reviewed: 03/15/2016 °Elsevier Interactive Patient Education © 2018 [employer] ° ° °Implanted Port Home Guide °An implanted port is a type of central line that is placed under the skin. Central lines are used to provide IV access when treatment or nutrition needs to be given through a person’s veins. Implanted ports are used for long-term IV access. An implanted port may be placed because: °· You need IV medicine that would be irritating to the small veins in your hands or arms. °· You need long-term IV medicines, such as antibiotics. °· You need IV nutrition for a long period. °· You need frequent blood draws for lab tests. °· You need dialysis. ° °Implanted ports are usually placed in the chest area, but they can also be placed in the upper arm, the abdomen, or the leg. An implanted port has two main parts: °· Reservoir. The reservoir is round and will appear as a small, raised area under your skin. The reservoir is the part where a needle is inserted to give medicines or draw blood. °· Catheter. The catheter is a thin, flexible tube that extends from the reservoir. The catheter is placed into a large vein. Medicine that is inserted into the reservoir goes into the catheter and then into the vein. ° °How will I care for my incision site? °Do not get the incision site wet. Bathe or shower as directed by your health care provider. °How is my port accessed? °Special steps must be taken to access the   port: °· Before the port is accessed, a numbing cream can be placed on the skin. This helps numb the skin over the port site. °· Your health care provider uses a sterile technique to access the port. °? Your health care provider must put on a mask and sterile gloves. °? The skin over your port is cleaned carefully with an antiseptic and allowed to dry. °? The port is gently pinched between sterile gloves, and a needle  is inserted into the port. °· Only "non-coring" port needles should be used to access the port. Once the port is accessed, a blood return should be checked. This helps ensure that the port is in the vein and is not clogged. °· If your port needs to remain accessed for a constant infusion, a clear (transparent) bandage will be placed over the needle site. The bandage and needle will need to be changed every week, or as directed by your health care provider. °· Keep the bandage covering the needle clean and dry. Do not get it wet. Follow your health care provider’s instructions on how to take a shower or bath while the port is accessed. °· If your port does not need to stay accessed, no bandage is needed over the port. ° °What is flushing? °Flushing helps keep the port from getting clogged. Follow your health care provider’s instructions on how and when to flush the port. Ports are usually flushed with saline solution or a medicine called heparin. The need for flushing will depend on how the port is used. °· If the port is used for intermittent medicines or blood draws, the port will need to be flushed: °? After medicines have been given. °? After blood has been drawn. °? As part of routine maintenance. °· If a constant infusion is running, the port may not need to be flushed. ° °How long will my port stay implanted? °The port can stay in for as long as your health care provider thinks it is needed. When it is time for the port to come out, surgery will be done to remove it. The procedure is similar to the one performed when the port was put in. °When should I seek immediate medical care? °When you have an implanted port, you should seek immediate medical care if: °· You notice a bad smell coming from the incision site. °· You have swelling, redness, or drainage at the incision site. °· You have more swelling or pain at the port site or the surrounding area. °· You have a fever that is not controlled with  medicine. ° °This information is not intended to replace advice given to you by your health care provider. Make sure you discuss any questions you have with your health care provider. °Document Released: 11/24/2005 Document Revised: 05/01/2016 Document Reviewed: 08/01/2013 °Elsevier Interactive Patient Education © 2017 Else[employer] Inc. ° ° °Implanted Port Insertion, Care After °This sheet gives you information about how to care for yourself after your procedure. Your health care provider may also give you more specific instructions. If you have problems or questions, contact your health care provider. °What can I expect after the procedure? °After your procedure, it is common to have: °· Discomfort at the port insertion site. °· Bruising on the skin over the port. This should improve over 3-4 days. ° °Follow these instructions at home: °Port care °· After your port is placed, you will get a manufacturer's information card. The card has information about your port. Keep this card with   you at all times.  Take care of the port as told by your health care provider. Ask your health care provider if you or a family member can get training for taking care of the port at home. A home health care nurse may also take care of the port.  Make sure to remember what type of port you have. Incision care  Follow instructions from your health care provider about how to take care of your port insertion site. Make sure you: ? Wash your hands with soap and water before you change your bandage (dressing). If soap and water are not available, use hand sanitizer. ? Change your dressing as told by your health care provider. ? Leave stitches (sutures), skin glue, or adhesive strips in place. These skin closures may need to stay in place for 2 weeks or longer. If adhesive strip edges start to loosen and curl up, you may trim the loose edges. Do not remove adhesive strips completely unless your health care provider tells you to do  that.  Check your port insertion site every day for signs of infection. Check for: ? More redness, swelling, or pain. ? More fluid or blood. ? Warmth. ? Pus or a bad smell. General instructions  Do not take baths, swim, or use a hot tub until your health care provider approves.  Do not lift anything that is heavier than 10 lb (4.5 kg) for a week, or as told by your health care provider.  Ask your health care provider when it is okay to: ? Return to work or school. ? Resume usual physical activities or sports.  Do not drive for 24 hours if you were given a medicine to help you relax (sedative).  Take over-the-counter and prescription medicines only as told by your health care provider.  Wear a medical alert bracelet in case of an emergency. This will tell any health care providers that you have a port.  Keep all follow-up visits as told by your health care provider. This is important. Contact a health care provider if:  You cannot flush your port with saline as directed, or you cannot draw blood from the port.  You have a fever or chills.  You have more redness, swelling, or pain around your port insertion site.  You have more fluid or blood coming from your port insertion site.  Your port insertion site feels warm to the touch.  You have pus or a bad smell coming from the port insertion site. Get help right away if:  You have chest pain or shortness of breath.  You have bleeding from your port that you cannot control. Summary  Take care of the port as told by your health care provider.  Change your dressing as told by your health care provider.  Keep all follow-up visits as told by your health care provider. This information is not intended to replace advice given to you by your health care provider. Make sure you discuss any questions you have with your health care provider. Document Released: 09/14/2013 Document Revised: 10/15/2016 Document Reviewed:  10/15/2016 Elsevier Interactive Patient Education  2017 [place].   Bone Marrow Aspiration and Bone Marrow Biopsy, Adult, Care After This sheet gives you information about how to care for yourself after your procedure. Your health care provider may also give you more specific instructions. If you have problems or questions, contact your health care provider. What can I expect after the procedure? After the procedure, it is common to  have:  Mild pain and tenderness.  Swelling.  Bruising.  Follow these instructions at home:  Take over-the-counter or prescription medicines only as told by your health care provider.  Do not take baths, swim, or use a hot tub until your health care provider approves. Ask if you can take a shower or have a sponge bath.  Follow instructions from your health care provider about how to take care of the puncture site. Make sure you: ? Wash your hands with soap and water before you change your bandage (dressing). If soap and water are not available, use hand sanitizer. ? Change your dressing as told by your health care provider.  Check your puncture siteevery day for signs of infection. Check for: ? More redness, swelling, or pain. ? More fluid or blood. ? Warmth. ? Pus or a bad smell.  Return to your normal activities as told by your health care provider. Ask your health care provider what activities are safe for you.  Do not drive for 24 hours if you were given a medicine to help you relax (sedative).  Keep all follow-up visits as told by your health care provider. This is important. Contact a health care provider if:  You have more redness, swelling, or pain around the puncture site.  You have more fluid or blood coming from the puncture site.  Your puncture site feels warm to the touch.  You have pus or a bad smell coming from the puncture site.  You have a fever.  Your pain is not controlled with medicine. This information is not  intended to replace advice given to you by your health care provider. Make sure you discuss any questions you have with your health care provider. Document Released: 06/13/2005 Document Revised: 06/13/2016 Document Reviewed: 05/07/2016 Elsevier Interactive Patient Education  2018 [employer].

## 2017-07-29 NOTE — Procedures (Addendum)
  Procedure:   LEFT IJ Port cathter placement with [place] and fluoroscopy   Preprocedure diagnosis:  lymphoma Postprocedure diagnosis:  same EBL:     minimal Complications:   none immediate  See full dictation in [facility].  Dillard Cannon MD Main # [phone] Pager  [phone]

## 2017-07-29 NOTE — Procedures (Signed)
   Procedure:   CT-guided  R iliac bone marrow aspiration and core biopsy  Preprocedure diagnosis:  Lymphoma Postprocedure diagnosis:  same EBL:     minimal Complications:   none immediate  See full dictation in [facility].  Dillard Cannon MD Main # [phone] Pager  [phone]

## 2017-07-29 NOTE — Sedation Documentation (Signed)
Pt already sedated from previous CT Bone Marrow Biopsy.

## 2017-07-29 NOTE — Sedation Documentation (Signed)
Patient is resting comfortably in a supine position.  

## 2017-07-29 NOTE — H&P (Signed)
Chief Complaint: Patient was seen in consultation today for lymphoma  Referring Physician(s): India Hook G  Supervising Physician: Arne Cleveland  Patient Status: [facility] - Out-pt  History of Present Illness: Todd Bell is a 71 y.o. male with no significant past medical history who presents with right-sided neck mass.   CT Soft Tissue Neck W/Contrast 06/15/17: Bulky RIGHT supraclavicular mass, likely metastatic lymphadenopathy. A discrete head and neck primary is not established.  PET 06/30/17: 1. Highly hypermetabolic right conglomerate supraclavicular mass. This is accompanied by accentuated metabolic activity in the palatine tonsillar pillars, a hypermetabolic right buccinator lymph node, hypermetabolic thoracic lymph nodes including a mildly enlarged AP window lymph node and other upper normal size but mildly hypermetabolic hilar and infrahilar nodes. Appearance compatible with malignancy such as lymphoma or adenocarcinoma, tissue diagnosis is recommended and probably most readily accessible from the large right supraclavicular mass.  IR consulted for bone marrow biopsy and Port-A-Cath placement at the request of D. Perlov.  Patient presents today with complaint of cheek swelling which he states has been present since around the time of his open lymph node biopsy 07/13/17. He has been NPO.  He does not take blood thinners.   Past Medical History:  Diagnosis Date  . Medical history non-contributory     Past Surgical History:  Procedure Laterality Date  . CATARACT EXTRACTION    . COLONOSCOPY  02/2016  . LYMPH NODE BIOPSY Right 07/13/2017   Procedure: OPEN NECK LYMPH NODE BIOPSY RIGHT SIDE;  Surgeon: Izora Gala, MD;  Location: [facility];  Service: ENT;  Laterality: Right;    Allergies: Patient has no known allergies.  Medications: Prior to Admission medications   Medication Sig Start Date End Date Taking? Authorizing Provider  aspirin EC 81 MG  tablet Take 81 mg by mouth daily.    [provider]  cephALEXin (KEFLEX) 500 MG capsule Take 1 capsule (500 mg total) by mouth 3 (three) times daily. 07/13/17   Izora Gala, MD     Family History  Problem Relation Age of Onset  . Cancer Father        Pancreatic    Social History   Social History  . Marital status: Married    Spouse name: N/A  . Number of children: N/A  . Years of education: N/A   Social History Main Topics  . Smoking status: Former Smoker    Quit date: 1988  . Smokeless tobacco: Never Used     Comment: he was a social drinker  . Alcohol use Yes     Comment: occasional  . Drug use: No  . Sexual activity: Not Asked   Other Topics Concern  . None   Social History Narrative  . None    Review of Systems: A 12 point ROS discussed and pertinent positives are indicated in the HPI above.  All other systems are negative.  Review of Systems  Constitutional: Negative for fatigue and fever.  Respiratory: Negative for cough and shortness of breath.   Cardiovascular: Negative for chest pain.  Psychiatric/Behavioral: Negative for behavioral problems and confusion.    Vital Signs: BP 121/76   Pulse 65   Temp 98.4 F (36.9 C) (Oral)   Resp 18   Ht 5' 7"  (1.702 m)   Wt 165 lb 4.8 oz (75 kg)   SpO2 97%   BMI 25.89 kg/m   Physical Exam  Constitutional: He is oriented to person, place, and time. He appears well-developed.  HENT:  Non-tender swelling of the right cheek.  Cardiovascular: Normal rate, regular rhythm and normal heart sounds.   Pulmonary/Chest: Effort normal and breath sounds normal. No respiratory distress.  Lymphadenopathy:    He has cervical adenopathy.  Neurological: He is alert and oriented to person, place, and time.  Skin: Skin is warm and dry.  Psychiatric: He has a normal mood and affect. His behavior is normal. Judgment and thought content normal.  Nursing note and vitals reviewed.   Mallampati Score:  MD  Evaluation Airway: WNL Heart: WNL Abdomen: WNL Chest/ Lungs: WNL ASA  Classification: 3 Mallampati/Airway Score: Two  Imaging: Nm Pet Image Initial (pi) Skull Base To Thigh  Result Date: 06/30/2017 CLINICAL DATA:  Initial treatment strategy for right supraclavicular mass. EXAM: NUCLEAR MEDICINE PET SKULL BASE TO THIGH TECHNIQUE: 8.0 mCi F-18 FDG was injected intravenously. Full-ring PET imaging was performed from the skull base to thigh after the radiotracer. CT data was obtained and used for attenuation correction and anatomic localization. FASTING BLOOD GLUCOSE:  Value: 108 mg/dl COMPARISON:  CT scan dated 06/15/2017 FINDINGS: NECK Dominant 7.6 by 4.0 cm right supraclavicular mass, maximum SUV 29.5. A 0.8 cm right buccinator node has a maximum SUV of 10.1, compatible with malignancy. This abuts but does not appear to be definitively arising from the right prostate lobe. Smaller lymph nodes are present along the periphery of this conglomerate. There is accentuated metabolic activity in the palatine tonsillar pillars, maximum SUV 11.6 on the right and 8.3 on the left. Chronic left maxillary sinusitis. CHEST Hypermetabolic AP window, right hilar, and bilateral infrahilar lymph nodes. Index AP window lymph node 1.4 cm in short axis on image 70/4 with maximum SUV 4.9. Mild atelectasis or scarring in the left lower lobe. Mild peripheral interstitial accentuation in the lungs. Calcified pleural plaques anteriorly in the right lung. ABDOMEN/PELVIS No abnormal hypermetabolic activity within the liver, pancreas, adrenal glands, or spleen. No hypermetabolic lymph nodes in the abdomen or pelvis. Aortoiliac atherosclerotic vascular disease.  Left adrenal adenoma. SKELETON No focal hypermetabolic activity to suggest skeletal metastasis. IMPRESSION: 1. Highly hypermetabolic right conglomerate supraclavicular mass. This is accompanied by accentuated metabolic activity in the palatine tonsillar pillars, a hypermetabolic  right buccinator lymph node, hypermetabolic thoracic lymph nodes including a mildly enlarged AP window lymph node and other upper normal size but mildly hypermetabolic hilar and infrahilar nodes. Appearance compatible with malignancy such as lymphoma or adenocarcinoma, tissue diagnosis is recommended and probably most readily accessible from the large right supraclavicular mass. 2.  Aortic Atherosclerosis (ICD10-I70.0). 3. Left adrenal adenoma. 4. Chronic left maxillary sinusitis. Electronically Signed   By: Van Clines M.D.   On: 06/30/2017 11:28    Labs:  CBC:  Recent Labs  07/17/17 1046 07/29/17 0714  WBC 6.6 7.2  HGB 15.4 14.1  HCT 46.3 41.9  PLT 247 235    COAGS:  Recent Labs  07/29/17 0714  INR 1.02  APTT 28    BMP:  Recent Labs  07/17/17 1046  NA 140  K 4.3  CO2 29  GLUCOSE 105  BUN 14.5  CALCIUM 9.6  CREATININE 0.9    LIVER FUNCTION TESTS:  Recent Labs  07/17/17 1046  BILITOT 0.43  AST 18  ALT 14  ALKPHOS 71  PROT 7.8  ALBUMIN 3.9    TUMOR MARKERS: No results for input(s): AFPTM, CEA, CA199, CHROMGRNA in the last 8760 hours.  Assessment and Plan: Patient with no significant past medical history of  presents with complaint of neck  mass which has been determined to be large B-cell lymphoma.  IR consulted for bone marrow biopsy and Port-A-Cath placement at the request of Dr. Lebron Conners. Patient presents today in their usual state of health.  He has been NPO and is not currently on blood thinners.  Risks and benefits discussed with the patient including, but not limited to bleeding, infection, damage to adjacent structures or low yield requiring additional tests. Risks and benefits discussed with the patient including, but not limited to bleeding, infection, pneumothorax, or fibrin sheath development and need for additional procedures. All of the patient's questions were answered, patient is agreeable to proceed. Consent signed and in  chart.    Thank you for this interesting consult.  I greatly enjoyed meeting Taiten Brawn and look forward to participating in their care.  A copy of this report was sent to the requesting provider on this date.  Electronically Signed: Docia Barrier, PA 07/29/2017, 9:07 AM   I spent a total of  30 Minutes   in face to face in clinical consultation, greater than 50% of which was counseling/coordinating care for lymphoma.

## 2017-07-30 ENCOUNTER — Telehealth: Payer: Self-pay | Admitting: Hematology

## 2017-07-30 NOTE — Telephone Encounter (Signed)
Left voicemail for patient regarding the addition of an injection 8/27.

## 2017-07-31 ENCOUNTER — Ambulatory Visit (HOSPITAL_BASED_OUTPATIENT_CLINIC_OR_DEPARTMENT_OTHER): Payer: Medicare Other | Admitting: Hematology

## 2017-07-31 ENCOUNTER — Other Ambulatory Visit: Payer: Medicare Other

## 2017-07-31 ENCOUNTER — Ambulatory Visit (HOSPITAL_BASED_OUTPATIENT_CLINIC_OR_DEPARTMENT_OTHER): Payer: Medicare Other

## 2017-07-31 ENCOUNTER — Encounter: Payer: Self-pay | Admitting: Hematology

## 2017-07-31 VITALS — BP 127/77 | HR 70 | Temp 98.3°F | Resp 17

## 2017-07-31 VITALS — BP 136/84 | HR 55 | Temp 98.3°F | Resp 18 | Ht 67.0 in | Wt 170.9 lb

## 2017-07-31 DIAGNOSIS — C8331 Diffuse large B-cell lymphoma, lymph nodes of head, face, and neck: Secondary | ICD-10-CM

## 2017-07-31 DIAGNOSIS — Z5111 Encounter for antineoplastic chemotherapy: Secondary | ICD-10-CM | POA: Diagnosis not present

## 2017-07-31 DIAGNOSIS — Z5112 Encounter for antineoplastic immunotherapy: Secondary | ICD-10-CM | POA: Diagnosis present

## 2017-07-31 DIAGNOSIS — R221 Localized swelling, mass and lump, neck: Secondary | ICD-10-CM

## 2017-07-31 LAB — CBC WITH DIFFERENTIAL/PLATELET
BASO%: 0.6 % (ref 0.0–2.0)
BASOS ABS: 0 10*3/uL (ref 0.0–0.1)
EOS ABS: 0.8 10*3/uL — AB (ref 0.0–0.5)
EOS%: 12.7 % — AB (ref 0.0–7.0)
HEMATOCRIT: 40.2 % (ref 38.4–49.9)
HGB: 13.6 g/dL (ref 13.0–17.1)
LYMPH%: 16.8 % (ref 14.0–49.0)
MCH: 31.9 pg (ref 27.2–33.4)
MCHC: 33.7 g/dL (ref 32.0–36.0)
MCV: 94.5 fL (ref 79.3–98.0)
MONO#: 0.8 10*3/uL (ref 0.1–0.9)
MONO%: 12.1 % (ref 0.0–14.0)
NEUT#: 3.6 10*3/uL (ref 1.5–6.5)
NEUT%: 57.8 % (ref 39.0–75.0)
Platelets: 213 10*3/uL (ref 140–400)
RBC: 4.26 10*6/uL (ref 4.20–5.82)
RDW: 13.8 % (ref 11.0–14.6)
WBC: 6.2 10*3/uL (ref 4.0–10.3)
lymph#: 1.1 10*3/uL (ref 0.9–3.3)

## 2017-07-31 LAB — COMPREHENSIVE METABOLIC PANEL
ALK PHOS: 67 U/L (ref 40–150)
ALT: 13 U/L (ref 0–55)
AST: 18 U/L (ref 5–34)
Albumin: 3.5 g/dL (ref 3.5–5.0)
Anion Gap: 8 mEq/L (ref 3–11)
BUN: 12.3 mg/dL (ref 7.0–26.0)
CALCIUM: 9 mg/dL (ref 8.4–10.4)
CHLORIDE: 107 meq/L (ref 98–109)
CO2: 26 mEq/L (ref 22–29)
Creatinine: 0.8 mg/dL (ref 0.7–1.3)
EGFR: 89 mL/min/{1.73_m2} — AB (ref >90)
Glucose: 90 mg/dl (ref 70–140)
POTASSIUM: 3.9 meq/L (ref 3.5–5.1)
SODIUM: 141 meq/L (ref 136–145)
Total Bilirubin: 0.37 mg/dL (ref 0.20–1.20)
Total Protein: 7 g/dL (ref 6.4–8.3)

## 2017-07-31 LAB — LACTATE DEHYDROGENASE: LDH: 262 U/L — AB (ref 125–245)

## 2017-07-31 MED ORDER — DOXORUBICIN HCL CHEMO IV INJECTION 2 MG/ML
50.0000 mg/m2 | Freq: Once | INTRAVENOUS | Status: AC
  Administered 2017-07-31: 96 mg via INTRAVENOUS
  Filled 2017-07-31: qty 48

## 2017-07-31 MED ORDER — SODIUM CHLORIDE 0.9% FLUSH
10.0000 mL | INTRAVENOUS | Status: DC | PRN
  Filled 2017-07-31: qty 10

## 2017-07-31 MED ORDER — DIPHENHYDRAMINE HCL 25 MG PO CAPS
ORAL_CAPSULE | ORAL | Status: AC
  Filled 2017-07-31: qty 2

## 2017-07-31 MED ORDER — SODIUM CHLORIDE 0.9 % IV SOLN
Freq: Once | INTRAVENOUS | Status: AC
  Administered 2017-07-31: 12:00:00 via INTRAVENOUS

## 2017-07-31 MED ORDER — PREDNISONE 20 MG PO TABS: 40.0000 mg/m2 | ORAL_TABLET | Freq: Every day | ORAL | 3 refills | Status: AC

## 2017-07-31 MED ORDER — DEXAMETHASONE SODIUM PHOSPHATE 10 MG/ML IJ SOLN
INTRAMUSCULAR | Status: AC
  Filled 2017-07-31: qty 1

## 2017-07-31 MED ORDER — PALONOSETRON HCL INJECTION 0.25 MG/5ML
INTRAVENOUS | Status: AC
  Filled 2017-07-31: qty 5

## 2017-07-31 MED ORDER — ALLOPURINOL 100 MG PO TABS: 100.0000 mg | ORAL_TABLET | Freq: Two times a day (BID) | ORAL | 0 refills | Status: DC

## 2017-07-31 MED ORDER — PROCHLORPERAZINE MALEATE 10 MG PO TABS: 10.0000 mg | ORAL_TABLET | Freq: Four times a day (QID) | ORAL | 6 refills | Status: DC | PRN

## 2017-07-31 MED ORDER — PALONOSETRON HCL INJECTION 0.25 MG/5ML
0.2500 mg | Freq: Once | INTRAVENOUS | Status: AC
  Administered 2017-07-31: 0.25 mg via INTRAVENOUS

## 2017-07-31 MED ORDER — FAMOTIDINE IN NACL 20-0.9 MG/50ML-% IV SOLN
INTRAVENOUS | Status: AC
  Filled 2017-07-31: qty 50

## 2017-07-31 MED ORDER — SODIUM CHLORIDE 0.9 % IV SOLN
750.0000 mg/m2 | Freq: Once | INTRAVENOUS | Status: AC
  Administered 2017-07-31: 1440 mg via INTRAVENOUS
  Filled 2017-07-31: qty 72

## 2017-07-31 MED ORDER — SODIUM CHLORIDE 0.9 % IV SOLN
2.0000 mg | Freq: Once | INTRAVENOUS | Status: AC
  Administered 2017-07-31: 2 mg via INTRAVENOUS
  Filled 2017-07-31: qty 2

## 2017-07-31 MED ORDER — SODIUM CHLORIDE 0.9 % IV SOLN
375.0000 mg/m2 | Freq: Once | INTRAVENOUS | Status: AC
  Administered 2017-07-31: 700 mg via INTRAVENOUS
  Filled 2017-07-31: qty 50

## 2017-07-31 MED ORDER — ACETAMINOPHEN 325 MG PO TABS
650.0000 mg | ORAL_TABLET | Freq: Once | ORAL | Status: AC
  Administered 2017-07-31: 650 mg via ORAL

## 2017-07-31 MED ORDER — HEPARIN SOD (PORK) LOCK FLUSH 100 UNIT/ML IV SOLN
500.0000 [IU] | Freq: Once | INTRAVENOUS | Status: AC | PRN
  Administered 2017-07-31: 500 [IU]
  Filled 2017-07-31: qty 5

## 2017-07-31 MED ORDER — DIPHENHYDRAMINE HCL 25 MG PO CAPS
50.0000 mg | ORAL_CAPSULE | Freq: Once | ORAL | Status: AC
  Administered 2017-07-31: 50 mg via ORAL

## 2017-07-31 MED ORDER — ACETAMINOPHEN 325 MG PO TABS
ORAL_TABLET | ORAL | Status: AC
  Filled 2017-07-31: qty 2

## 2017-07-31 MED ORDER — ONDANSETRON HCL 8 MG PO TABS: 8.0000 mg | ORAL_TABLET | Freq: Two times a day (BID) | ORAL | 1 refills | Status: DC | PRN

## 2017-07-31 MED ORDER — DEXAMETHASONE SODIUM PHOSPHATE 10 MG/ML IJ SOLN
10.0000 mg | Freq: Once | INTRAMUSCULAR | Status: AC
  Administered 2017-07-31: 10 mg via INTRAVENOUS

## 2017-07-31 MED ORDER — FAMOTIDINE IN NACL 20-0.9 MG/50ML-% IV SOLN
20.0000 mg | Freq: Once | INTRAVENOUS | Status: AC
  Administered 2017-07-31: 20 mg via INTRAVENOUS

## 2017-07-31 NOTE — Patient Instructions (Signed)
[facility] Discharge Instructions for Patients Receiving Chemotherapy  Today you received the following chemotherapy agents Rituxan, Doxorubicin, Vincristine, Cytoxan  To help prevent nausea and vomiting after your treatment, we encourage you to take your nausea medication as prescribed by MD   If you develop nausea and vomiting that is not controlled by your nausea medication, call the clinic.   BELOW ARE SYMPTOMS THAT SHOULD BE REPORTED IMMEDIATELY:  *FEVER GREATER THAN 100.5 F  *CHILLS WITH OR WITHOUT FEVER  NAUSEA AND VOMITING THAT IS NOT CONTROLLED WITH YOUR NAUSEA MEDICATION  *UNUSUAL SHORTNESS OF BREATH  *UNUSUAL BRUISING OR BLEEDING  TENDERNESS IN MOUTH AND THROAT WITH OR WITHOUT PRESENCE OF ULCERS  *URINARY PROBLEMS  *BOWEL PROBLEMS  UNUSUAL RASH Items with * indicate a potential emergency and should be followed up as soon as possible.  Feel free to call the clinic you have any questions or concerns. The clinic phone number is [phone].  Please show the [facility] at check-in to the Emergency Department and triage nurse.  Vincristine injection What is this medicine? VINCRISTINE (vin KRIS teen) is a chemotherapy drug. It slows the growth of cancer cells. This medicine is used to treat many types of cancer like Hodgkin's disease, leukemia, non-Hodgkin's lymphoma, neuroblastoma (brain cancer), rhabdomyosarcoma, and Wilms' tumor. This medicine may be used for other purposes; ask your health care provider or pharmacist if you have questions. COMMON BRAND NAME(S): Oncovin, Vincasar PFS What should I tell my health care provider before I take this medicine? They need to know if you have any of these conditions: -blood disorders -gout -infection (especially chickenpox, cold sores, or herpes) -kidney disease -liver disease -lung disease -nervous system disease like Charcot-Marie-Tooth (CMT) -recent or ongoing radiation therapy -an unusual or  allergic reaction to vincristine, other chemotherapy agents, other medicines, foods, dyes, or preservatives -pregnant or trying to get pregnant -breast-feeding How should I use this medicine? This drug is given as an infusion into a vein. It is administered in a hospital or clinic by a specially trained health care professional. If you have pain, swelling, burning, or any unusual feeling around the site of your injection, tell your health care professional right away. Talk to your pediatrician regarding the use of this medicine in children. While this drug may be prescribed for selected conditions, precautions do apply. Overdosage: If you think you have taken too much of this medicine contact a poison control center or emergency room at once. NOTE: This medicine is only for you. Do not share this medicine with others. What if I miss a dose? It is important not to miss your dose. Call your doctor or health care professional if you are unable to keep an appointment. What may interact with this medicine? Do not take this medicine with any of the following medications: -itraconazole -mibefradil -voriconazole This medicine may also interact with the following medications: -cyclosporine -erythromycin -fluconazole -ketoconazole -medicines for HIV like delavirdine, efavirenz, nevirapine -medicines for seizures like ethotoin, fosphenotoin, phenytoin -medicines to increase blood counts like filgrastim, pegfilgrastim, sargramostim -other chemotherapy drugs like cisplatin, L-asparaginase, methotrexate, mitomycin, paclitaxel -pegaspargase -vaccines -zalcitabine, ddC Talk to your doctor or health care professional before taking any of these medicines: -acetaminophen -aspirin -ibuprofen -ketoprofen -naproxen This list may not describe all possible interactions. Give your health care provider a list of all the medicines, herbs, non-prescription drugs, or dietary supplements you use. Also tell them  if you smoke, drink alcohol, or use illegal drugs. Some items may interact  with your medicine. What should I watch for while using this medicine? Your condition will be monitored carefully while you are receiving this medicine. You will need important blood work done while you are taking this medicine. This drug may make you feel generally unwell. This is not uncommon, as chemotherapy can affect healthy cells as well as cancer cells. Report any side effects. Continue your course of treatment even though you feel ill unless your doctor tells you to stop. In some cases, you may be given additional medicines to help with side effects. Follow all directions for their use. Call your doctor or health care professional for advice if you get a fever, chills or sore throat, or other symptoms of a cold or flu. Do not treat yourself. Avoid taking products that contain aspirin, acetaminophen, ibuprofen, naproxen, or ketoprofen unless instructed by your doctor. These medicines may hide a fever. Do not become pregnant while taking this medicine. Women should inform their doctor if they wish to become pregnant or think they might be pregnant. There is a potential for serious side effects to an unborn child. Talk to your health care professional or pharmacist for more information. Do not breast-feed an infant while taking this medicine. Men may have a lower sperm count while taking this medicine. Talk to your doctor if you plan to father a child. What side effects may I notice from receiving this medicine? Side effects that you should report to your doctor or health care professional as soon as possible: -allergic reactions like skin rash, itching or hives, swelling of the face, lips, or tongue -breathing problems -confusion or changes in emotions or moods -constipation -cough -mouth sores -muscle weakness -nausea and vomiting -pain, swelling, redness or irritation at the injection site -pain, tingling, numbness  in the hands or feet -problems with balance, talking, walking -seizures -stomach pain -trouble passing urine or change in the amount of urine Side effects that usually do not require medical attention (report to your doctor or health care professional if they continue or are bothersome): -diarrhea -hair loss -jaw pain -loss of appetite This list may not describe all possible side effects. Call your doctor for medical advice about side effects. You may report side effects to FDA at 1-800-FDA-1088. Where should I keep my medicine? This drug is given in a hospital or clinic and will not be stored at home. NOTE: This sheet is a summary. It may not cover all possible information. If you have questions about this medicine, talk to your doctor, pharmacist, or health care provider.  2018 Elsevier/Gold Standard (2008-08-21 17:17:13)  Doxorubicin injection What is this medicine? DOXORUBICIN (dox oh ROO bi sin) is a chemotherapy drug. It is used to treat many kinds of cancer like leukemia, lymphoma, neuroblastoma, sarcoma, and Wilms' tumor. It is also used to treat bladder cancer, breast cancer, lung cancer, ovarian cancer, stomach cancer, and thyroid cancer. This medicine may be used for other purposes; ask your health care provider or pharmacist if you have questions. COMMON BRAND NAME(S): Adriamycin, Adriamycin PFS, Adriamycin RDF, Rubex What should I tell my health care provider before I take this medicine? They need to know if you have any of these conditions: -heart disease -history of low blood counts caused by a medicine -liver disease -recent or ongoing radiation therapy -an unusual or allergic reaction to doxorubicin, other chemotherapy agents, other medicines, foods, dyes, or preservatives -pregnant or trying to get pregnant -breast-feeding How should I use this medicine? This drug is given as  an infusion into a vein. It is administered in a hospital or clinic by a specially trained  health care professional. If you have pain, swelling, burning or any unusual feeling around the site of your injection, tell your health care professional right away. Talk to your pediatrician regarding the use of this medicine in children. Special care may be needed. Overdosage: If you think you have taken too much of this medicine contact a poison control center or emergency room at once. NOTE: This medicine is only for you. Do not share this medicine with others. What if I miss a dose? It is important not to miss your dose. Call your doctor or health care professional if you are unable to keep an appointment. What may interact with this medicine? This medicine may interact with the following medications: -6-mercaptopurine -paclitaxel -phenytoin -St. John's Wort -trastuzumab -verapamil This list may not describe all possible interactions. Give your health care provider a list of all the medicines, herbs, non-prescription drugs, or dietary supplements you use. Also tell them if you smoke, drink alcohol, or use illegal drugs. Some items may interact with your medicine. What should I watch for while using this medicine? This drug may make you feel generally unwell. This is not uncommon, as chemotherapy can affect healthy cells as well as cancer cells. Report any side effects. Continue your course of treatment even though you feel ill unless your doctor tells you to stop. There is a maximum amount of this medicine you should receive throughout your life. The amount depends on the medical condition being treated and your overall health. Your doctor will watch how much of this medicine you receive in your lifetime. Tell your doctor if you have taken this medicine before. You may need blood work done while you are taking this medicine. Your urine may turn red for a few days after your dose. This is not blood. If your urine is dark or brown, call your doctor. In some cases, you may be given additional  medicines to help with side effects. Follow all directions for their use. Call your doctor or health care professional for advice if you get a fever, chills or sore throat, or other symptoms of a cold or flu. Do not treat yourself. This drug decreases your body's ability to fight infections. Try to avoid being around people who are sick. This medicine may increase your risk to bruise or bleed. Call your doctor or health care professional if you notice any unusual bleeding. Talk to your doctor about your risk of cancer. You may be more at risk for certain types of cancers if you take this medicine. Do not become pregnant while taking this medicine or for 6 months after stopping it. Women should inform their doctor if they wish to become pregnant or think they might be pregnant. Men should not father a child while taking this medicine and for 6 months after stopping it. There is a potential for serious side effects to an unborn child. Talk to your health care professional or pharmacist for more information. Do not breast-feed an infant while taking this medicine. This medicine has caused ovarian failure in some women and reduced sperm counts in some men This medicine may interfere with the ability to have a child. Talk with your doctor or health care professional if you are concerned about your fertility. What side effects may I notice from receiving this medicine? Side effects that you should report to your doctor or health care professional as  soon as possible: -allergic reactions like skin rash, itching or hives, swelling of the face, lips, or tongue -breathing problems -chest pain -fast or irregular heartbeat -low blood counts - this medicine may decrease the number of white blood cells, red blood cells and platelets. You may be at increased risk for infections and bleeding. -pain, redness, or irritation at site where injected -signs of infection - fever or chills, cough, sore throat, pain or  difficulty passing urine -signs of decreased platelets or bleeding - bruising, pinpoint red spots on the skin, black, tarry stools, blood in the urine -swelling of the ankles, feet, hands -tiredness -weakness Side effects that usually do not require medical attention (report to your doctor or health care professional if they continue or are bothersome): -diarrhea -hair loss -mouth sores -nail discoloration or damage -nausea -red colored urine -vomiting This list may not describe all possible side effects. Call your doctor for medical advice about side effects. You may report side effects to FDA at 1-800-FDA-1088. Where should I keep my medicine? This drug is given in a hospital or clinic and will not be stored at home. NOTE: This sheet is a summary. It may not cover all possible information. If you have questions about this medicine, talk to your doctor, pharmacist, or health care provider.  2018 Elsevier/Gold Standard (2016-01-21 11:28:51)   Cyclophosphamide injection What is this medicine? CYCLOPHOSPHAMIDE (sye kloe FOSS fa mide) is a chemotherapy drug. It slows the growth of cancer cells. This medicine is used to treat many types of cancer like lymphoma, myeloma, leukemia, breast cancer, and ovarian cancer, to name a few. This medicine may be used for other purposes; ask your health care provider or pharmacist if you have questions. COMMON BRAND NAME(S): Cytoxan, Neosar What should I tell my health care provider before I take this medicine? They need to know if you have any of these conditions: -blood disorders -history of other chemotherapy -infection -kidney disease -liver disease -recent or ongoing radiation therapy -tumors in the bone marrow -an unusual or allergic reaction to cyclophosphamide, other chemotherapy, other medicines, foods, dyes, or preservatives -pregnant or trying to get pregnant -breast-feeding How should I use this medicine? This drug is usually given  as an injection into a vein or muscle or by infusion into a vein. It is administered in a hospital or clinic by a specially trained health care professional. Talk to your pediatrician regarding the use of this medicine in children. Special care may be needed. Overdosage: If you think you have taken too much of this medicine contact a poison control center or emergency room at once. NOTE: This medicine is only for you. Do not share this medicine with others. What if I miss a dose? It is important not to miss your dose. Call your doctor or health care professional if you are unable to keep an appointment. What may interact with this medicine? This medicine may interact with the following medications: -amiodarone -amphotericin B -azathioprine -certain antiviral medicines for HIV or AIDS such as protease inhibitors (e.g., indinavir, ritonavir) and zidovudine -certain blood pressure medications such as benazepril, captopril, enalapril, fosinopril, lisinopril, moexipril, monopril, perindopril, quinapril, ramipril, trandolapril -certain cancer medications such as anthracyclines (e.g., daunorubicin, doxorubicin), busulfan, cytarabine, paclitaxel, pentostatin, tamoxifen, trastuzumab -certain diuretics such as chlorothiazide, chlorthalidone, hydrochlorothiazide, indapamide, metolazone -certain medicines that treat or prevent blood clots like warfarin -certain muscle relaxants such as succinylcholine -cyclosporine -etanercept -indomethacin -medicines to increase blood counts like filgrastim, pegfilgrastim, sargramostim -medicines used as general anesthesia -metronidazole -  natalizumab This list may not describe all possible interactions. Give your health care provider a list of all the medicines, herbs, non-prescription drugs, or dietary supplements you use. Also tell them if you smoke, drink alcohol, or use illegal drugs. Some items may interact with your medicine. What should I watch for while using  this medicine? Visit your doctor for checks on your progress. This drug may make you feel generally unwell. This is not uncommon, as chemotherapy can affect healthy cells as well as cancer cells. Report any side effects. Continue your course of treatment even though you feel ill unless your doctor tells you to stop. Drink water or other fluids as directed. Urinate often, even at night. In some cases, you may be given additional medicines to help with side effects. Follow all directions for their use. Call your doctor or health care professional for advice if you get a fever, chills or sore throat, or other symptoms of a cold or flu. Do not treat yourself. This drug decreases your body's ability to fight infections. Try to avoid being around people who are sick. This medicine may increase your risk to bruise or bleed. Call your doctor or health care professional if you notice any unusual bleeding. Be careful brushing and flossing your teeth or using a toothpick because you may get an infection or bleed more easily. If you have any dental work done, tell your dentist you are receiving this medicine. You may get drowsy or dizzy. Do not drive, use machinery, or do anything that needs mental alertness until you know how this medicine affects you. Do not become pregnant while taking this medicine or for 1 year after stopping it. Women should inform their doctor if they wish to become pregnant or think they might be pregnant. Men should not father a child while taking this medicine and for 4 months after stopping it. There is a potential for serious side effects to an unborn child. Talk to your health care professional or pharmacist for more information. Do not breast-feed an infant while taking this medicine. This medicine may interfere with the ability to have a child. This medicine has caused ovarian failure in some women. This medicine has caused reduced sperm counts in some men. You should talk with your  doctor or health care professional if you are concerned about your fertility. If you are going to have surgery, tell your doctor or health care professional that you have taken this medicine. What side effects may I notice from receiving this medicine? Side effects that you should report to your doctor or health care professional as soon as possible: -allergic reactions like skin rash, itching or hives, swelling of the face, lips, or tongue -low blood counts - this medicine may decrease the number of white blood cells, red blood cells and platelets. You may be at increased risk for infections and bleeding. -signs of infection - fever or chills, cough, sore throat, pain or difficulty passing urine -signs of decreased platelets or bleeding - bruising, pinpoint red spots on the skin, black, tarry stools, blood in the urine -signs of decreased red blood cells - unusually weak or tired, fainting spells, lightheadedness -breathing problems -dark urine -dizziness -palpitations -swelling of the ankles, feet, hands -trouble passing urine or change in the amount of urine -weight gain -yellowing of the eyes or skin Side effects that usually do not require medical attention (report to your doctor or health care professional if they continue or are bothersome): -changes in nail  or skin color -hair loss -missed menstrual periods -mouth sores -nausea, vomiting This list may not describe all possible side effects. Call your doctor for medical advice about side effects. You may report side effects to FDA at 1-800-FDA-1088. Where should I keep my medicine? This drug is given in a hospital or clinic and will not be stored at home. NOTE: This sheet is a summary. It may not cover all possible information. If you have questions about this medicine, talk to your doctor, pharmacist, or health care provider.  2018 Elsevier/Gold Standard (2012-10-08 16:22:58)   Rituximab injection What is this  medicine? RITUXIMAB (ri TUX i mab) is a monoclonal antibody. It is used to treat certain types of cancer like non-Hodgkin lymphoma and chronic lymphocytic leukemia. It is also used to treat rheumatoid arthritis, granulomatosis with polyangiitis (or Wegener's granulomatosis), and microscopic polyangiitis. This medicine may be used for other purposes; ask your health care provider or pharmacist if you have questions. COMMON BRAND NAME(S): Rituxan What should I tell my health care provider before I take this medicine? They need to know if you have any of these conditions: -heart disease -infection (especially a virus infection such as hepatitis B, chickenpox, cold sores, or herpes) -immune system problems -irregular heartbeat -kidney disease -lung or breathing disease, like asthma -recently received or scheduled to receive a vaccine -an unusual or allergic reaction to rituximab, mouse proteins, other medicines, foods, dyes, or preservatives -pregnant or trying to get pregnant -breast-feeding How should I use this medicine? This medicine is for infusion into a vein. It is administered in a hospital or clinic by a specially trained health care professional. A special MedGuide will be given to you by the pharmacist with each prescription and refill. Be sure to read this information carefully each time. Talk to your pediatrician regarding the use of this medicine in children. This medicine is not approved for use in children. Overdosage: If you think you have taken too much of this medicine contact a poison control center or emergency room at once. NOTE: This medicine is only for you. Do not share this medicine with others. What if I miss a dose? It is important not to miss a dose. Call your doctor or health care professional if you are unable to keep an appointment. What may interact with this medicine? -cisplatin -other medicines for arthritis like disease modifying antirheumatic drugs or tumor  necrosis factor inhibitors -live virus vaccines This list may not describe all possible interactions. Give your health care provider a list of all the medicines, herbs, non-prescription drugs, or dietary supplements you use. Also tell them if you smoke, drink alcohol, or use illegal drugs. Some items may interact with your medicine. What should I watch for while using this medicine? Your condition will be monitored carefully while you are receiving this medicine. You may need blood work done while you are taking this medicine. This medicine can cause serious allergic reactions. To reduce your risk you may need to take medicine before treatment with this medicine. Take your medicine as directed. In some patients, this medicine may cause a serious brain infection that may cause death. If you have any problems seeing, thinking, speaking, walking, or standing, tell your doctor right away. If you cannot reach your doctor, urgently seek other source of medical care. Call your doctor or health care professional for advice if you get a fever, chills or sore throat, or other symptoms of a cold or flu. Do not treat yourself. This drug  decreases your body's ability to fight infections. Try to avoid being around people who are sick. Do not become pregnant while taking this medicine or for 12 months after stopping it. Women should inform their doctor if they wish to become pregnant or think they might be pregnant. There is a potential for serious side effects to an unborn child. Talk to your health care professional or pharmacist for more information. What side effects may I notice from receiving this medicine? Side effects that you should report to your doctor or health care professional as soon as possible: -breathing problems -chest pain -dizziness or feeling faint -fast, irregular heartbeat -low blood counts - this medicine may decrease the number of white blood cells, red blood cells and platelets. You may be  at increased risk for infections and bleeding. -mouth sores -redness, blistering, peeling or loosening of the skin, including inside the mouth (this can be added for any serious or exfoliative rash that could lead to hospitalization) -signs of infection - fever or chills, cough, sore throat, pain or difficulty passing urine -signs and symptoms of kidney injury like trouble passing urine or change in the amount of urine -signs and symptoms of liver injury like dark yellow or brown urine; general ill feeling or flu-like symptoms; light-colored stools; loss of appetite; nausea; right upper belly pain; unusually weak or tired; yellowing of the eyes or skin -stomach pain -vomiting Side effects that usually do not require medical attention (report to your doctor or health care professional if they continue or are bothersome): -headache -joint pain -muscle cramps or muscle pain This list may not describe all possible side effects. Call your doctor for medical advice about side effects. You may report side effects to FDA at 1-800-FDA-1088. Where should I keep my medicine? This drug is given in a hospital or clinic and will not be stored at home. NOTE: This sheet is a summary. It may not cover all possible information. If you have questions about this medicine, talk to your doctor, pharmacist, or health care provider.  2018 Elsevier/Gold Standard (2016-07-02 15:28:09)

## 2017-07-31 NOTE — Progress Notes (Signed)
Todd Bell  HEMATOLOGY ONCOLOGY PROGRESS NOTE  Date of service: .07/31/2017  Patient Care Team: Lujean Amel, MD as PCP - General (Family Medicine) Ardath Sax, MD as Consulting Physician (Hematology and Oncology) Eppie Gibson, MD as Attending Physician (Radiation Oncology) Leota Sauers, RN as Oncology Nurse Navigator  Diagnosis: Diffuse large B cell lymphoma  Current Treatment: R-CHOP with G-CSF support  SUMMARY OF ONCOLOGIC HISTORY:   Diffuse large B-cell lymphoma of lymph nodes of neck ([place])   06/15/2017 Imaging    CT Neck/Chest: Normal laryngeal, pharyngeal, salivary structures and a 5.0 cm supraclavicular mass on the right side.No lymphadenopathy in the mediastinum, hilar structures, and a 0.4 cm right upper lobe nodule and a 0.5 cm left upper lobe nodule.       06/30/2017 Imaging    PET-CT: Highly hypermetabolic right conglomerate supraclavicular mass. This is accompanied by accentuated metabolic activity in the palatine tonsillar pillars, a hypermetabolic right buccinator lymph node, hypermetabolic thoracic lymph nodes including a mildly enlarged AP window lymph node and other upper normal size but mildly hypermetabolic hilar and infrahilar nodes. Appearance compatible with malignancy such as lymphoma or adenocarcinoma      07/13/2017 Pathology Results    Rt Neck mass core bx: Sheets of large lymphocytes discernible lymph node structure, consistent with diffuse large B-cell lymphoma. IHC -- positive for LCA, CD20, CD79a, PAX-5, BCL-6 & negative for CD10, CD34, BCL-2, CD30, CD138, kappa or lambda light chains      07/17/2017 Initial Diagnosis    Diffuse large B-cell lymphoma of lymph nodes of neck ([facility])      INTERVAL HISTORY:  Todd Bell Is here for follow-up prior to his first cycle of R CHOP chemotherapy for newly diagnosed stage II diffuse large B-cell lymphoma. He is a patient of Dr. Lebron Conners.  Since his last visit he had a bone marrow biopsy on 07/29/2017- which showed no  overt evidence of involvement by lymphoma. He notes that his right-sided neck mass has increased in size since his last clinic visit and he is keen to start treatment. He has had his port placed. We set him up for chemotherapy counseling today. His chemotherapy related medications were sent to his pharmacy. His family accompanied him today. He and his family had multiple questions about the chemotherapy which were answered in details.  REVIEW OF SYSTEMS:    10 Point review of systems of done and is negative except as noted above.  . Past Medical History:  Diagnosis Date  . Medical history non-contributory     . Past Surgical History:  Procedure Laterality Date  . CATARACT EXTRACTION    . COLONOSCOPY  02/2016  . IR FLUORO GUIDE PORT INSERTION LEFT  07/29/2017  . IR US GUIDE VASC ACCESS LEFT  07/29/2017  . LYMPH NODE BIOPSY Right 07/13/2017   Procedure: OPEN NECK LYMPH NODE BIOPSY RIGHT SIDE;  Surgeon: Izora Gala, MD;  Location: [facility];  Service: ENT;  Laterality: Right;    . Social History  Substance Use Topics  . Smoking status: Former Smoker    Quit date: 1988  . Smokeless tobacco: Never Used     Comment: he was a social drinker  . Alcohol use Yes     Comment: occasional    ALLERGIES:  has No Known Allergies.  MEDICATIONS:  Current Outpatient Prescriptions  Medication Sig Dispense Refill  . aspirin EC 81 MG tablet Take 81 mg by mouth daily.    . cephALEXin (KEFLEX) 500 MG capsule Take  1 capsule (500 mg total) by mouth 3 (three) times daily. 15 capsule 0   No current facility-administered medications for this visit.     PHYSICAL EXAMINATION: ECOG PERFORMANCE STATUS: 1 - Symptomatic but completely ambulatory  . Vitals:   07/31/17 0929  BP: 136/84  Pulse: (!) 55  Resp: 18  Temp: 98.3 F (36.8 C)  SpO2: 99%    Filed Weights   07/31/17 0929  Weight: 170 lb 14.4 oz (77.5 kg)   .Body mass index is 26.77 kg/m.  GENERAL:alert, in no  acute distress and comfortable SKIN: no acute rashes, no significant lesions EYES: conjunctiva are pink and non-injected, sclera anicteric OROPHARYNX: MMM, no exudates, no oropharyngeal erythema or ulceration NECK: supple, no JVD LYMPH:  no palpable lymphadenopathy in the cervical, axillary or inguinal regions LUNGS: clear to auscultation b/l with normal respiratory effort HEART: regular rate & rhythm ABDOMEN:  normoactive bowel sounds , non tender, not distended. No palpable hepatosplenomegaly Extremity: no pedal edema PSYCH: alert & oriented x 3 with fluent speech NEURO: no focal motor/sensory deficits  LABORATORY DATA:   I have reviewed the data as listed  . CBC Latest Ref Rng & Units 07/31/2017 07/29/2017 07/17/2017  WBC 4.0 - 10.3 10e3/uL 6.2 7.2 6.6  Hemoglobin 13.0 - 17.1 g/dL 13.6 14.1 15.4  Hematocrit 38.4 - 49.9 % 40.2 41.9 46.3  Platelets 140 - 400 10e3/uL 213 235 247    . CMP Latest Ref Rng & Units 07/31/2017 07/17/2017  Glucose 70 - 140 mg/dl 90 105  BUN 7.0 - 26.0 mg/dL 12.3 14.5  Creatinine 0.7 - 1.3 mg/dL 0.8 0.9  Sodium 136 - 145 mEq/L 141 140  Potassium 3.5 - 5.1 mEq/L 3.9 4.3  CO2 22 - 29 mEq/L 26 29  Calcium 8.4 - 10.4 mg/dL 9.0 9.6  Total Protein 6.4 - 8.3 g/dL 7.0 7.8  Total Bilirubin 0.20 - 1.20 mg/dL 0.37 0.43  Alkaline Phos 40 - 150 U/L 67 71  AST 5 - 34 U/L 18 18  ALT 0 - 55 U/L 13 14   Component     Latest Ref Rng & Units 07/17/2017  HCV Ab     0.0 - 0.9 s/co ratio <0.1  Comment:      Comment  LDH     125 - 245 U/L 247 (H)  Uric Acid, Serum     2.6 - 7.4 mg/dl 6.0  Hep B Surface Ab, Qual      Non Reactive  Hepatitis B Surface Ag     Negative Negative  Hep B Core Ab, Tot     Negative Negative       RADIOGRAPHIC STUDIES: I have personally reviewed the radiological images as listed and agreed with the findings in the report.  [facility] [employer].                         [place], [place] [address]                            [phone]  ------------------------------------------------------------------- Transthoracic Echocardiography  Patient:    Todd, Bell Todd #:  [id number] Study Date: 07/23/2017 Gender:     M Age:        71 Height:     172.7 cm Weight:     75.8 kg BSA:        1.92 m^2 Pt. Status: Room:   PERFORMING   [facility], Outpatient  SONOGRAPHER  Darlina Sicilian, RDCS  ORDERING     Lebron Conners, Mikhail G  cc:  ------------------------------------------------------------------- LV EF: 60% -   65%  ------------------------------------------------------------------- Indications:      V58.11 Chemotherapy Evaluation.  ------------------------------------------------------------------- History:   PMH:  Large B Cell Lymphoma of the lymph nodes of the neck. No prior cardiac history.  ------------------------------------------------------------------- Study Conclusions  - Left ventricle: The cavity size was normal. Wall thickness was   increased in a pattern of mild LVH. Systolic function was normal.   The estimated ejection fraction was in the range of 60% to 65%.   Wall motion was normal; there were no regional wall motion   abnormalities. Doppler parameters are consistent with abnormal   left ventricular relaxation (grade 1 diastolic dysfunction). - Aortic valve: There was mild to moderate regurgitation. - Aortic root: The aortic root was mildly dilated. - Mitral valve: There was mild regurgitation.  PET/CT 06/30/2017  IMPRESSION: 1. Highly hypermetabolic right conglomerate supraclavicular mass. This is accompanied by accentuated metabolic activity in the palatine tonsillar pillars, a hypermetabolic right buccinator lymph node, hypermetabolic thoracic lymph nodes including a mildly enlarged AP window lymph node and other upper normal size but mildly hypermetabolic hilar and infrahilar nodes. Appearance  compatible with malignancy such as lymphoma or adenocarcinoma, tissue diagnosis is recommended and probably most readily accessible from the large right supraclavicular mass. 2.  Aortic Atherosclerosis (ICD10-I70.0). 3. Left adrenal adenoma. 4. Chronic left maxillary sinusitis.   Electronically Signed   By: Van Clines M.D.   On: 06/30/2017 11:28   Ir US Guide Vasc Access Left  Result Date: 07/29/2017 CLINICAL DATA:  Lymphoma. Needs durable venous access for chemotherapy regimen. EXAM: TUNNELED PORT CATHETER PLACEMENT WITH ULTRASOUND AND FLUOROSCOPIC GUIDANCE FLUOROSCOPY TIME:  0.9 min  , 122  uGym2 DAP ANESTHESIA/SEDATION: Intravenous Fentanyl and Versed were administered as conscious sedation during continuous monitoring of the patient's level of consciousness and physiological / cardiorespiratory status by the radiology RN, with a total moderate sedation time of 30 minutes. TECHNIQUE: The procedure, risks, benefits, and alternatives were explained to the patient. Questions regarding the procedure were encouraged and answered. The patient understands and consents to the procedure. As antibiotic prophylaxis, cefazolin 2 g was ordered pre-procedure and administered intravenously within one hour of incision. A left-sided approach was selected due to recent right neck open surgical lymph node resection and regional residual adenopathy. Patency of the left IJ vein was confirmed with ultrasound with image documentation. An appropriate skin site was determined. Skin site was marked. Region was prepped using maximum barrier technique including cap and mask, sterile gown, sterile gloves, large sterile sheet, and Chlorhexidine as cutaneous antisepsis. The region was infiltrated locally with 1% lidocaine. Under real-time ultrasound guidance, the left IJ vein was accessed with a 21 gauge micropuncture needle; the needle tip within the vein was confirmed with ultrasound image documentation. Needle was  exchanged over a 018 guidewire for transitional dilator which allowed passage of the [facility] wire into the IVC. Over this, the transitional dilator was exchanged for a 5 [place] MPA catheter. A small incision was made on the left anterior chest wall and a subcutaneous pocket fashioned. The power-injectable port was  positioned and its catheter tunneled to the left IJ dermatotomy site. The MPA catheter was exchanged over an Amplatz wire for a peel-away sheath, through which the port catheter, which had been trimmed to the appropriate length, was advanced and positioned under fluoroscopy with its tip at the cavoatrial junction. Spot chest radiograph confirms good catheter position and no pneumothorax. The pocket was closed with deep interrupted and subcuticular continuous 3-0 Monocryl sutures. The port was flushed per protocol. The incisions were covered with Dermabond then covered with a sterile dressing. COMPLICATIONS: COMPLICATIONS None immediate IMPRESSION: Technically successful left IJ power-injectable port catheter placement. Ready for routine use. Electronically Signed   By: Lucrezia Europe M.D.   On: 07/29/2017 14:25   Ct Biopsy  Result Date: 07/29/2017 CLINICAL DATA:  Lymphoma EXAM: CT GUIDED DEEP ILIAC BONE ASPIRATION AND CORE BIOPSY TECHNIQUE: The procedure, risks (including but not limited to bleeding, infection, organ damage ), benefits, and alternatives were explained to the patient. Questions regarding the procedure were encouraged and answered. The patient understands and consents to the procedure. Patient was placed supine on the CT gantry and limited axial scans through the pelvis were obtained. Appropriate skin entry site was identified. Skin site was marked, prepped with chlorhexidine, draped in usual sterile fashion, and infiltrated locally with 1% lidocaine. Intravenous Fentanyl and Versed were administered as conscious sedation during continuous monitoring of the patient's level of consciousness  and physiological / cardiorespiratory status by the radiology RN, with a total moderate sedation time of 10 minutes. Under CT fluoroscopic guidance an 11-gauge Cook trocar bone needle was advanced into the right iliac bone just lateral to the sacroiliac joint. Once needle tip position was confirmed, core and aspiration samples were obtained, submitted to pathology for approval. Post procedure scans show no hematoma or fracture. Patient tolerated procedure well. COMPLICATIONS: COMPLICATIONS none IMPRESSION: 1. Technically successful CT guided right iliac bone core and aspiration biopsy. Electronically Signed   By: Lucrezia Europe M.D.   On: 07/29/2017 14:17   Ct Bone Marrow Biopsy & Aspiration  Result Date: 07/29/2017 CLINICAL DATA:  Lymphoma EXAM: CT GUIDED DEEP ILIAC BONE ASPIRATION AND CORE BIOPSY TECHNIQUE: The procedure, risks (including but not limited to bleeding, infection, organ damage ), benefits, and alternatives were explained to the patient. Questions regarding the procedure were encouraged and answered. The patient understands and consents to the procedure. Patient was placed supine on the CT gantry and limited axial scans through the pelvis were obtained. Appropriate skin entry site was identified. Skin site was marked, prepped with chlorhexidine, draped in usual sterile fashion, and infiltrated locally with 1% lidocaine. Intravenous Fentanyl and Versed were administered as conscious sedation during continuous monitoring of the patient's level of consciousness and physiological / cardiorespiratory status by the radiology RN, with a total moderate sedation time of 10 minutes. Under CT fluoroscopic guidance an 11-gauge Cook trocar bone needle was advanced into the right iliac bone just lateral to the sacroiliac joint. Once needle tip position was confirmed, core and aspiration samples were obtained, submitted to pathology for approval. Post procedure scans show no hematoma or fracture. Patient tolerated  procedure well. COMPLICATIONS: COMPLICATIONS none IMPRESSION: 1. Technically successful CT guided right iliac bone core and aspiration biopsy. Electronically Signed   By: Lucrezia Europe M.D.   On: 07/29/2017 14:17   Ir Fluoro Guide Port Insertion Left  Result Date: 07/29/2017 CLINICAL DATA:  Lymphoma. Needs durable venous access for chemotherapy regimen. EXAM: TUNNELED PORT CATHETER PLACEMENT WITH ULTRASOUND AND FLUOROSCOPIC GUIDANCE FLUOROSCOPY  TIME:  0.9 min  , 122  uGym2 DAP ANESTHESIA/SEDATION: Intravenous Fentanyl and Versed were administered as conscious sedation during continuous monitoring of the patient's level of consciousness and physiological / cardiorespiratory status by the radiology RN, with a total moderate sedation time of 30 minutes. TECHNIQUE: The procedure, risks, benefits, and alternatives were explained to the patient. Questions regarding the procedure were encouraged and answered. The patient understands and consents to the procedure. As antibiotic prophylaxis, cefazolin 2 g was ordered pre-procedure and administered intravenously within one hour of incision. A left-sided approach was selected due to recent right neck open surgical lymph node resection and regional residual adenopathy. Patency of the left IJ vein was confirmed with ultrasound with image documentation. An appropriate skin site was determined. Skin site was marked. Region was prepped using maximum barrier technique including cap and mask, sterile gown, sterile gloves, large sterile sheet, and Chlorhexidine as cutaneous antisepsis. The region was infiltrated locally with 1% lidocaine. Under real-time ultrasound guidance, the left IJ vein was accessed with a 21 gauge micropuncture needle; the needle tip within the vein was confirmed with ultrasound image documentation. Needle was exchanged over a 018 guidewire for transitional dilator which allowed passage of the [facility] wire into the IVC. Over this, the transitional dilator was  exchanged for a 5 [place] MPA catheter. A small incision was made on the left anterior chest wall and a subcutaneous pocket fashioned. The power-injectable port was positioned and its catheter tunneled to the left IJ dermatotomy site. The MPA catheter was exchanged over an Amplatz wire for a peel-away sheath, through which the port catheter, which had been trimmed to the appropriate length, was advanced and positioned under fluoroscopy with its tip at the cavoatrial junction. Spot chest radiograph confirms good catheter position and no pneumothorax. The pocket was closed with deep interrupted and subcuticular continuous 3-0 Monocryl sutures. The port was flushed per protocol. The incisions were covered with Dermabond then covered with a sterile dressing. COMPLICATIONS: COMPLICATIONS None immediate IMPRESSION: Technically successful left IJ power-injectable port catheter placement. Ready for routine use. Electronically Signed   By: Lucrezia Europe M.D.   On: 07/29/2017 14:25    ASSESSMENT & PLAN:   #1 stage IIA diffuse large B-cell lymphoma of the right side of the neck with evidence of bulky disease. Plan -Labs today appears stable. -Echo showed normal ejection fraction of 60-65% with grade 1 diastolic dysfunction. -His allopurinol and anti-emetic prescriptions as well as prednisone worsened to his pharmacy. -Chemotherapy counseling was arranged today. -No issues with his port that was placed on 8/22. -Discussed that chemotherapy regimen and bone marrow biopsies in detail. -Patient is appropriate to proceed with his first cycle of R CHOP today. -He will come back on Monday 08/03/2017 for his Neulasta. -Return to clinic with Dr Lebron Conners in 10 days with labs for toxicity check. -continue f/u with PCP .Koirala, Dibas, MD for mx of other medical co-morbidities.   Chemo-counseling today R-CHOP C1 today Neulasta on Monday 08/03/2017 RTC with Dr Lebron Conners in 10 days with labs    I spent 30 minutes counseling  the patient face to face. The total time spent in the appointment was 40 minutes and more than 50% was on counseling and direct patient cares.    Sullivan Lone MD [place] AAHIVMS [facility] Hematology/Oncology Physician [facility]  (Office):       [phone] (Work cell):  [phone] (Fax):           [phone]

## 2017-07-31 NOTE — Patient Instructions (Signed)
Thank you for choosing [facility] to provide your oncology and hematology care.  To afford each patient quality time with our providers, please arrive 30 minutes before your scheduled appointment time.  If you arrive late for your appointment, you may be asked to reschedule.  We strive to give you quality time with our providers, and arriving late affects you and other patients whose appointments are after yours.   If you are a no show for multiple scheduled visits, you may be dismissed from the clinic at the providers discretion.    Again, thank you for choosing [facility], our hope is that these requests will decrease the amount of time that you wait before being seen by our physicians.  ______________________________________________________________________  Should you have questions after your visit to the [facility], please contact our office at [phone] between the hours of 8:30 and 4:30 p.m.    Voicemails left after 4:30p.m will not be returned until the following business day.    For prescription refill requests, please have your pharmacy contact us directly.  Please also try to allow 48 hours for prescription requests.    Please contact the scheduling department for questions regarding scheduling.  For scheduling of procedures such as PET scans, CT scans, MRI, Ultrasound, etc please contact central scheduling at [phone].    Resources For Cancer Patients and Caregivers:   Oncolink.org:  A wonderful resource for patients and healthcare providers for information regarding your disease, ways to tract your treatment, what to expect, etc.     American Cancer Society:  800-227-2345  Can help patients locate various types of support and financial assistance  Cancer Care: 1-800-813-HOPE (4673) Provides financial assistance, online support groups, medication/co-pay assistance.    [place] DSS:  336-641-3447 Where to apply for food  stamps, Medicaid, and utility assistance  Medicare Rights Center: 800-333-4114 Helps people with Medicare understand their rights and benefits, navigate the Medicare system, and secure the quality healthcare they deserve  SCAT: 336-333-6589 [place] [employer]'s shared-ride transportation service for eligible riders who have a disability that prevents them from riding the fixed route bus.    For additional information on assistance programs please contact our social worker:   Grier Hock/Abigail Elmore:  [phone]            

## 2017-08-03 ENCOUNTER — Ambulatory Visit (HOSPITAL_BASED_OUTPATIENT_CLINIC_OR_DEPARTMENT_OTHER): Payer: Medicare Other

## 2017-08-03 ENCOUNTER — Telehealth: Payer: Self-pay | Admitting: Hematology

## 2017-08-03 ENCOUNTER — Telehealth: Payer: Self-pay | Admitting: *Deleted

## 2017-08-03 VITALS — BP 139/89 | HR 63 | Temp 97.2°F | Resp 18

## 2017-08-03 DIAGNOSIS — C8331 Diffuse large B-cell lymphoma, lymph nodes of head, face, and neck: Secondary | ICD-10-CM | POA: Diagnosis present

## 2017-08-03 DIAGNOSIS — Z5189 Encounter for other specified aftercare: Secondary | ICD-10-CM

## 2017-08-03 MED ORDER — PEGFILGRASTIM INJECTION 6 MG/0.6ML ~~LOC~~
6.0000 mg | PREFILLED_SYRINGE | Freq: Once | SUBCUTANEOUS | Status: AC
  Administered 2017-08-03: 6 mg via SUBCUTANEOUS
  Filled 2017-08-03: qty 0.6

## 2017-08-03 NOTE — Telephone Encounter (Signed)
Scheduled apt for f/u per 8/24 los - patients wife took the message and is aware of appt date and time.

## 2017-08-03 NOTE — Telephone Encounter (Signed)
Oncology Nurse Navigator Documentation  Received call from patient wife asking about next chemo appt.  I replied expected 9/14, encouraged her to call next week if she has not been notified by scheduling. She stated he tolerated last Friday's chemo without difficulty, did not experience any side effects over the weekend.  Gayleen Orem, RN, BSN, [place] [facility] Nurse [place] [facility] [place] [phone]

## 2017-08-03 NOTE — Patient Instructions (Signed)
Pegfilgrastim injection What is this medicine? PEGFILGRASTIM (PEG fil gra stim) is a long-acting granulocyte colony-stimulating factor that stimulates the growth of neutrophils, a type of white blood cell important in the body's fight against infection. It is used to reduce the incidence of fever and infection in patients with certain types of cancer who are receiving chemotherapy that affects the bone marrow, and to increase survival after being exposed to high doses of radiation. This medicine may be used for other purposes; ask your health care provider or pharmacist if you have questions. COMMON BRAND NAME(S): Neulasta What should I tell my health care provider before I take this medicine? They need to know if you have any of these conditions: -kidney disease -latex allergy -ongoing radiation therapy -sickle cell disease -skin reactions to acrylic adhesives (On-Body Injector only) -an unusual or allergic reaction to pegfilgrastim, filgrastim, other medicines, foods, dyes, or preservatives -pregnant or trying to get pregnant -breast-feeding How should I use this medicine? This medicine is for injection under the skin. If you get this medicine at home, you will be taught how to prepare and give the pre-filled syringe or how to use the On-body Injector. Refer to the patient Instructions for Use for detailed instructions. Use exactly as directed. Tell your healthcare provider immediately if you suspect that the On-body Injector may not have performed as intended or if you suspect the use of the On-body Injector resulted in a missed or partial dose. It is important that you put your used needles and syringes in a special sharps container. Do not put them in a trash can. If you do not have a sharps container, call your pharmacist or healthcare provider to get one. Talk to your pediatrician regarding the use of this medicine in children. While this drug may be prescribed for selected conditions,  precautions do apply. Overdosage: If you think you have taken too much of this medicine contact a poison control center or emergency room at once. NOTE: This medicine is only for you. Do not share this medicine with others. What if I miss a dose? It is important not to miss your dose. Call your doctor or health care professional if you miss your dose. If you miss a dose due to an On-body Injector failure or leakage, a new dose should be administered as soon as possible using a single prefilled syringe for manual use. What may interact with this medicine? Interactions have not been studied. Give your health care provider a list of all the medicines, herbs, non-prescription drugs, or dietary supplements you use. Also tell them if you smoke, drink alcohol, or use illegal drugs. Some items may interact with your medicine. This list may not describe all possible interactions. Give your health care provider a list of all the medicines, herbs, non-prescription drugs, or dietary supplements you use. Also tell them if you smoke, drink alcohol, or use illegal drugs. Some items may interact with your medicine. What should I watch for while using this medicine? You may need blood work done while you are taking this medicine. If you are going to need a MRI, CT scan, or other procedure, tell your doctor that you are using this medicine (On-Body Injector only). What side effects may I notice from receiving this medicine? Side effects that you should report to your doctor or health care professional as soon as possible: -allergic reactions like skin rash, itching or hives, swelling of the face, lips, or tongue -dizziness -fever -pain, redness, or irritation at site   where injected -pinpoint red spots on the skin -red or dark-brown urine -shortness of breath or breathing problems -stomach or side pain, or pain at the shoulder -swelling -tiredness -trouble passing urine or change in the amount of urine Side  effects that usually do not require medical attention (report to your doctor or health care professional if they continue or are bothersome): -bone pain -muscle pain This list may not describe all possible side effects. Call your doctor for medical advice about side effects. You may report side effects to FDA at 1-800-FDA-1088. Where should I keep my medicine? Keep out of the reach of children. Store pre-filled syringes in a refrigerator between 2 and 8 degrees C (36 and 46 degrees F). Do not freeze. Keep in carton to protect from light. Throw away this medicine if it is left out of the refrigerator for more than 48 hours. Throw away any unused medicine after the expiration date. NOTE: This sheet is a summary. It may not cover all possible information. If you have questions about this medicine, talk to your doctor, pharmacist, or health care provider.  2018 [employer] (2016-11-20 12:58:03)  

## 2017-08-11 ENCOUNTER — Telehealth: Payer: Self-pay | Admitting: Hematology

## 2017-08-11 ENCOUNTER — Ambulatory Visit (HOSPITAL_BASED_OUTPATIENT_CLINIC_OR_DEPARTMENT_OTHER): Payer: Medicare Other | Admitting: Hematology

## 2017-08-11 ENCOUNTER — Other Ambulatory Visit (HOSPITAL_BASED_OUTPATIENT_CLINIC_OR_DEPARTMENT_OTHER): Payer: Medicare Other

## 2017-08-11 ENCOUNTER — Encounter: Payer: Self-pay | Admitting: *Deleted

## 2017-08-11 ENCOUNTER — Encounter: Payer: Self-pay | Admitting: Hematology

## 2017-08-11 VITALS — BP 115/77 | HR 84 | Temp 98.0°F | Resp 18 | Ht 67.0 in | Wt 169.2 lb

## 2017-08-11 DIAGNOSIS — C8331 Diffuse large B-cell lymphoma, lymph nodes of head, face, and neck: Secondary | ICD-10-CM

## 2017-08-11 LAB — LACTATE DEHYDROGENASE: LDH: 231 U/L (ref 125–245)

## 2017-08-11 LAB — COMPREHENSIVE METABOLIC PANEL
ALT: 14 U/L (ref 0–55)
ANION GAP: 5 meq/L (ref 3–11)
AST: 15 U/L (ref 5–34)
Albumin: 3.4 g/dL — ABNORMAL LOW (ref 3.5–5.0)
Alkaline Phosphatase: 78 U/L (ref 40–150)
BILIRUBIN TOTAL: 0.24 mg/dL (ref 0.20–1.20)
BUN: 11.9 mg/dL (ref 7.0–26.0)
CALCIUM: 9 mg/dL (ref 8.4–10.4)
CHLORIDE: 107 meq/L (ref 98–109)
CO2: 29 meq/L (ref 22–29)
CREATININE: 0.9 mg/dL (ref 0.7–1.3)
EGFR: 80 mL/min/{1.73_m2} — ABNORMAL LOW (ref >90)
Glucose: 101 mg/dl (ref 70–140)
Potassium: 4.6 mEq/L (ref 3.5–5.1)
Sodium: 141 mEq/L (ref 136–145)
TOTAL PROTEIN: 6.7 g/dL (ref 6.4–8.3)

## 2017-08-11 LAB — CBC & DIFF AND RETIC
BASO%: 0.8 % (ref 0.0–2.0)
BASOS ABS: 0.1 10*3/uL (ref 0.0–0.1)
EOS%: 4.7 % (ref 0.0–7.0)
Eosinophils Absolute: 0.3 10*3/uL (ref 0.0–0.5)
HEMATOCRIT: 40.4 % (ref 38.4–49.9)
HGB: 13.4 g/dL (ref 13.0–17.1)
IMMATURE RETIC FRACT: 15.2 % — AB (ref 3.00–10.60)
LYMPH#: 1 10*3/uL (ref 0.9–3.3)
LYMPH%: 15.8 % (ref 14.0–49.0)
MCH: 31.3 pg (ref 27.2–33.4)
MCHC: 33.1 g/dL (ref 32.0–36.0)
MCV: 94.7 fL (ref 79.3–98.0)
MONO#: 0.6 10*3/uL (ref 0.1–0.9)
MONO%: 9.9 % (ref 0.0–14.0)
NEUT#: 4.3 10*3/uL (ref 1.5–6.5)
NEUT%: 68.8 % (ref 39.0–75.0)
Platelets: 121 10*3/uL — ABNORMAL LOW (ref 140–400)
RBC: 4.27 10*6/uL (ref 4.20–5.82)
RDW: 14 % (ref 11.0–14.6)
RETIC %: 2.11 % — AB (ref 0.80–1.80)
RETIC CT ABS: 90.1 10*3/uL (ref 34.80–93.90)
WBC: 6.3 10*3/uL (ref 4.0–10.3)

## 2017-08-11 MED ORDER — LIDOCAINE-PRILOCAINE 2.5-2.5 % EX CREA: 1.0000 "application " | TOPICAL_CREAM | CUTANEOUS | 6 refills | Status: DC | PRN

## 2017-08-11 NOTE — Progress Notes (Signed)
Oncology Nurse Navigator Documentation  To provide support, encouragement and care continuity, met with Mr. Studley and his dtr Leigh during est pt appt with Dr. Irene Limbo. He reported toleration of initial chemo wo/ SEs, denied issues with eating, sleeping, energy. He voiced understanding next chemo 9/14. He was encouraged to call me with needs/concerns.  Gayleen Orem, RN, BSN, [place] [facility] Nurse [facility] [place] [phone]

## 2017-08-11 NOTE — Progress Notes (Signed)
[facility]  HEMATOLOGY ONCOLOGY PROGRESS NOTE  Date of service: 08/11/17  Patient Care Team: Lujean Amel, MD as PCP - General (Family Medicine) Ardath Sax, MD as Consulting Physician (Hematology and Oncology) Eppie Gibson, MD as Attending Physician (Radiation Oncology) Leota Sauers, RN as Oncology Nurse Navigator  Diagnosis: Diffuse large B cell lymphoma  Current Treatment: R-CHOP with G-CSF support  SUMMARY OF ONCOLOGIC HISTORY:   Diffuse large B-cell lymphoma of lymph nodes of neck ([place])   06/15/2017 Imaging    CT Neck/Chest: Normal laryngeal, pharyngeal, salivary structures and a 5.0 cm supraclavicular mass on the right side.No lymphadenopathy in the mediastinum, hilar structures, and a 0.4 cm right upper lobe nodule and a 0.5 cm left upper lobe nodule.       06/30/2017 Imaging    PET-CT: Highly hypermetabolic right conglomerate supraclavicular mass. This is accompanied by accentuated metabolic activity in the palatine tonsillar pillars, a hypermetabolic right buccinator lymph node, hypermetabolic thoracic lymph nodes including a mildly enlarged AP window lymph node and other upper normal size but mildly hypermetabolic hilar and infrahilar nodes. Appearance compatible with malignancy such as lymphoma or adenocarcinoma      07/13/2017 Pathology Results    Rt Neck mass core bx: Sheets of large lymphocytes discernible lymph node structure, consistent with diffuse large B-cell lymphoma. IHC -- positive for LCA, CD20, CD79a, PAX-5, BCL-6 & negative for CD10, CD34, BCL-2, CD30, CD138, kappa or lambda light chains      07/17/2017 Initial Diagnosis    Diffuse large B-cell lymphoma of lymph nodes of neck ([place])     07/29/2017 Procedure    Placement of left IJ power-injectable port catheter.        INTERVAL HISTORY:  Mr Beavers Is here for follow-up following his 1st cycle of R-CHOP chemotherapy for newly diagnosed stage II diffuse large B-cell lymphoma. He is accompanied by his  daughter today. His right-sided neck mass has improved greatly since I last saw him. This has improved his anxiety surrounding his condition greatly. He has been increasing his medication and prayer to one hour daily and overall he is feeling well and is in good spirits. He has had a good appetite and has been eating well, the steroids have made him very hungry. He has been sleeping well and the prednisone has not been causing insomnia. Pt notes that following his treatment that he was mildly constipated. His daughter gave him prune juice at home which has now resolved this. He does have some bruising surrounding his port, but this has been improving.He denies nausea, fever, mouth sores, abdominal pain, leg swelling, or any other associated complaints.   He denies any myalgias or arthralgias from his most recent Neulasta injection.   REVIEW OF SYSTEMS:    10 Point review of systems of done and is negative except as noted above.  . Past Medical History:  Diagnosis Date   Medical history non-contributory     . Past Surgical History:  Procedure Laterality Date   CATARACT EXTRACTION     COLONOSCOPY  02/2016   IR FLUORO GUIDE PORT INSERTION LEFT  07/29/2017   IR US GUIDE VASC ACCESS LEFT  07/29/2017   LYMPH NODE BIOPSY Right 07/13/2017   Procedure: OPEN NECK LYMPH NODE BIOPSY RIGHT SIDE;  Surgeon: Izora Gala, MD;  Location: [facility];  Service: ENT;  Laterality: Right;    . Social History  Substance Use Topics   Smoking status: Former Smoker    Quit date: 1988  Smokeless tobacco: Never Used     Comment: he was a social drinker   Alcohol use Yes     Comment: occasional    ALLERGIES:  has No Known Allergies.  MEDICATIONS:  Current Outpatient Prescriptions  Medication Sig Dispense Refill   allopurinol (ZYLOPRIM) 100 MG tablet Take 1 tablet (100 mg total) by mouth 2 (two) times daily. 30 tablet 0   aspirin EC 81 MG tablet Take 81 mg by mouth daily.      cephALEXin (KEFLEX) 500 MG capsule Take 1 capsule (500 mg total) by mouth 3 (three) times daily. 15 capsule 0   ondansetron (ZOFRAN) 8 MG tablet Take 1 tablet (8 mg total) by mouth 2 (two) times daily as needed for refractory nausea / vomiting. Start on day 3 after cyclophosphamide. 30 tablet 1   predniSONE (DELTASONE) 20 MG tablet Take 4 tablets (80 mg total) by mouth daily. Take on days 1-5 of chemotherapy. 20 tablet 3   prochlorperazine (COMPAZINE) 10 MG tablet Take 1 tablet (10 mg total) by mouth every 6 (six) hours as needed (Nausea or vomiting). 30 tablet 6   No current facility-administered medications for this visit.     PHYSICAL EXAMINATION:  ECOG PERFORMANCE STATUS: 1 - Symptomatic but completely ambulatory  . Vitals:   08/11/17 1506  BP: 115/77  Pulse: 84  Resp: 18  Temp: 98 F (36.7 C)  SpO2: 98%    Filed Weights   08/11/17 1506  Weight: 169 lb 3.2 oz (76.7 kg)   .Body mass index is 26.5 kg/m.  GENERAL:alert, in no acute distress and comfortable SKIN: no acute rashes, no significant lesions EYES: conjunctiva are pink and non-injected, sclera anicteric OROPHARYNX: MMM, no exudates, no oropharyngeal erythema or ulceration NECK: supple, no JVD LYMPH:  no palpable lymphadenopathy in the cervical, axillary or inguinal regions LUNGS: clear to auscultation b/l with normal respiratory effort HEART: regular rate & rhythm ABDOMEN:  normoactive bowel sounds , non tender, not distended. No palpable hepatosplenomegaly Extremity: no pedal edema PSYCH: alert & oriented x 3 with fluent speech NEURO: no focal motor/sensory deficits  LABORATORY DATA:   I have reviewed the data as listed  . CBC Latest Ref Rng & Units 08/11/2017 07/31/2017 07/29/2017  WBC 4.0 - 10.3 10e3/uL 6.3 6.2 7.2  Hemoglobin 13.0 - 17.1 g/dL 13.4 13.6 14.1  Hematocrit 38.4 - 49.9 % 40.4 40.2 41.9  Platelets 140 - 400 10e3/uL 121(L) 213 235    . CMP Latest Ref Rng & Units 08/11/2017 07/31/2017  07/17/2017  Glucose 70 - 140 mg/dl 101 90 105  BUN 7.0 - 26.0 mg/dL 11.9 12.3 14.5  Creatinine 0.7 - 1.3 mg/dL 0.9 0.8 0.9  Sodium 136 - 145 mEq/L 141 141 140  Potassium 3.5 - 5.1 mEq/L 4.6 3.9 4.3  CO2 22 - 29 mEq/L 29 26 29   Calcium 8.4 - 10.4 mg/dL 9.0 9.0 9.6  Total Protein 6.4 - 8.3 g/dL 6.7 7.0 7.8  Total Bilirubin 0.20 - 1.20 mg/dL 0.24 0.37 0.43  Alkaline Phos 40 - 150 U/L 78 67 71  AST 5 - 34 U/L 15 18 18   ALT 0 - 55 U/L 14 13 14    . Lab Results  Component Value Date   LDH 231 08/11/2017    Component     Latest Ref Rng & Units 07/17/2017  HCV Ab     0.0 - 0.9 s/co ratio <0.1  Comment:      Comment  LDH     125 -  245 U/L 247 (H)  Uric Acid, Serum     2.6 - 7.4 mg/dl 6.0  Hep B Surface Ab, Qual      Non Reactive  Hepatitis B Surface Ag     Negative Negative  Hep B Core Ab, Tot     Negative Negative       RADIOGRAPHIC STUDIES: I have personally reviewed the radiological images as listed and agreed with the findings in the report.  [employer].                        [place], [place] [address]                            [phone]  ------------------------------------------------------------------- Transthoracic Echocardiography  Patient:    Vasily, Fedewa MR #:       [id number] Study Date: 07/23/2017 Gender:     M Age:        71 Height:     172.7 cm Weight:     75.8 kg BSA:        1.92 m^2 Pt. Status: Room:   PERFORMING   [facility], Outpatient  SONOGRAPHER  Darlina Sicilian, RDCS  ORDERING     Lebron Conners, Mikhail G  cc:  ------------------------------------------------------------------- LV EF: 60% -   65%  ------------------------------------------------------------------- Indications:      V58.11 Chemotherapy Evaluation.  ------------------------------------------------------------------- History:   PMH:  Large B Cell Lymphoma of the lymph nodes of  the neck. No prior cardiac history.  ------------------------------------------------------------------- Study Conclusions  - Left ventricle: The cavity size was normal. Wall thickness was   increased in a pattern of mild LVH. Systolic function was normal.   The estimated ejection fraction was in the range of 60% to 65%.   Wall motion was normal; there were no regional wall motion   abnormalities. Doppler parameters are consistent with abnormal   left ventricular relaxation (grade 1 diastolic dysfunction). - Aortic valve: There was mild to moderate regurgitation. - Aortic root: The aortic root was mildly dilated. - Mitral valve: There was mild regurgitation.  PET/CT 06/30/2017  IMPRESSION: 1. Highly hypermetabolic right conglomerate supraclavicular mass. This is accompanied by accentuated metabolic activity in the palatine tonsillar pillars, a hypermetabolic right buccinator lymph node, hypermetabolic thoracic lymph nodes including a mildly enlarged AP window lymph node and other upper normal size but mildly hypermetabolic hilar and infrahilar nodes. Appearance compatible with malignancy such as lymphoma or adenocarcinoma, tissue diagnosis is recommended and probably most readily accessible from the large right supraclavicular mass. 2.  Aortic Atherosclerosis (ICD10-I70.0). 3. Left adrenal adenoma. 4. Chronic left maxillary sinusitis.   Electronically Signed   By: Van Clines M.D.   On: 06/30/2017 11:28   Ir US Guide Vasc Access Left  Result Date: 07/29/2017 CLINICAL DATA:  Lymphoma. Needs durable venous access for chemotherapy regimen. EXAM: TUNNELED PORT CATHETER PLACEMENT WITH ULTRASOUND AND FLUOROSCOPIC GUIDANCE FLUOROSCOPY TIME:  0.9 min  , 122  uGym2 DAP ANESTHESIA/SEDATION: Intravenous Fentanyl and Versed were administered as conscious sedation during continuous monitoring of the patient's level of consciousness and physiological / cardiorespiratory status by  the radiology RN, with a total moderate  sedation time of 30 minutes. TECHNIQUE: The procedure, risks, benefits, and alternatives were explained to the patient. Questions regarding the procedure were encouraged and answered. The patient understands and consents to the procedure. As antibiotic prophylaxis, cefazolin 2 g was ordered pre-procedure and administered intravenously within one hour of incision. A left-sided approach was selected due to recent right neck open surgical lymph node resection and regional residual adenopathy. Patency of the left IJ vein was confirmed with ultrasound with image documentation. An appropriate skin site was determined. Skin site was marked. Region was prepped using maximum barrier technique including cap and mask, sterile gown, sterile gloves, large sterile sheet, and Chlorhexidine as cutaneous antisepsis. The region was infiltrated locally with 1% lidocaine. Under real-time ultrasound guidance, the left IJ vein was accessed with a 21 gauge micropuncture needle; the needle tip within the vein was confirmed with ultrasound image documentation. Needle was exchanged over a 018 guidewire for transitional dilator which allowed passage of the [facility] wire into the IVC. Over this, the transitional dilator was exchanged for a 5 [place] MPA catheter. A small incision was made on the left anterior chest wall and a subcutaneous pocket fashioned. The power-injectable port was positioned and its catheter tunneled to the left IJ dermatotomy site. The MPA catheter was exchanged over an Amplatz wire for a peel-away sheath, through which the port catheter, which had been trimmed to the appropriate length, was advanced and positioned under fluoroscopy with its tip at the cavoatrial junction. Spot chest radiograph confirms good catheter position and no pneumothorax. The pocket was closed with deep interrupted and subcuticular continuous 3-0 Monocryl sutures. The port was flushed per protocol. The  incisions were covered with Dermabond then covered with a sterile dressing. COMPLICATIONS: COMPLICATIONS None immediate IMPRESSION: Technically successful left IJ power-injectable port catheter placement. Ready for routine use. Electronically Signed   By: Lucrezia Europe M.D.   On: 07/29/2017 14:25   Ct Biopsy  Result Date: 07/29/2017 CLINICAL DATA:  Lymphoma EXAM: CT GUIDED DEEP ILIAC BONE ASPIRATION AND CORE BIOPSY TECHNIQUE: The procedure, risks (including but not limited to bleeding, infection, organ damage ), benefits, and alternatives were explained to the patient. Questions regarding the procedure were encouraged and answered. The patient understands and consents to the procedure. Patient was placed supine on the CT gantry and limited axial scans through the pelvis were obtained. Appropriate skin entry site was identified. Skin site was marked, prepped with chlorhexidine, draped in usual sterile fashion, and infiltrated locally with 1% lidocaine. Intravenous Fentanyl and Versed were administered as conscious sedation during continuous monitoring of the patient's level of consciousness and physiological / cardiorespiratory status by the radiology RN, with a total moderate sedation time of 10 minutes. Under CT fluoroscopic guidance an 11-gauge Cook trocar bone needle was advanced into the right iliac bone just lateral to the sacroiliac joint. Once needle tip position was confirmed, core and aspiration samples were obtained, submitted to pathology for approval. Post procedure scans show no hematoma or fracture. Patient tolerated procedure well. COMPLICATIONS: COMPLICATIONS none IMPRESSION: 1. Technically successful CT guided right iliac bone core and aspiration biopsy. Electronically Signed   By: Lucrezia Europe M.D.   On: 07/29/2017 14:17   Ct Bone Marrow Biopsy & Aspiration  Result Date: 07/29/2017 CLINICAL DATA:  Lymphoma EXAM: CT GUIDED DEEP ILIAC BONE ASPIRATION AND CORE BIOPSY TECHNIQUE: The procedure, risks  (including but not limited to bleeding, infection, organ damage ), benefits, and alternatives were explained to the patient. Questions regarding the procedure were  encouraged and answered. The patient understands and consents to the procedure. Patient was placed supine on the CT gantry and limited axial scans through the pelvis were obtained. Appropriate skin entry site was identified. Skin site was marked, prepped with chlorhexidine, draped in usual sterile fashion, and infiltrated locally with 1% lidocaine. Intravenous Fentanyl and Versed were administered as conscious sedation during continuous monitoring of the patient's level of consciousness and physiological / cardiorespiratory status by the radiology RN, with a total moderate sedation time of 10 minutes. Under CT fluoroscopic guidance an 11-gauge Cook trocar bone needle was advanced into the right iliac bone just lateral to the sacroiliac joint. Once needle tip position was confirmed, core and aspiration samples were obtained, submitted to pathology for approval. Post procedure scans show no hematoma or fracture. Patient tolerated procedure well. COMPLICATIONS: COMPLICATIONS none IMPRESSION: 1. Technically successful CT guided right iliac bone core and aspiration biopsy. Electronically Signed   By: Lucrezia Europe M.D.   On: 07/29/2017 14:17   Ir Fluoro Guide Port Insertion Left  Result Date: 07/29/2017 CLINICAL DATA:  Lymphoma. Needs durable venous access for chemotherapy regimen. EXAM: TUNNELED PORT CATHETER PLACEMENT WITH ULTRASOUND AND FLUOROSCOPIC GUIDANCE FLUOROSCOPY TIME:  0.9 min  , 122  uGym2 DAP ANESTHESIA/SEDATION: Intravenous Fentanyl and Versed were administered as conscious sedation during continuous monitoring of the patient's level of consciousness and physiological / cardiorespiratory status by the radiology RN, with a total moderate sedation time of 30 minutes. TECHNIQUE: The procedure, risks, benefits, and alternatives were explained to the  patient. Questions regarding the procedure were encouraged and answered. The patient understands and consents to the procedure. As antibiotic prophylaxis, cefazolin 2 g was ordered pre-procedure and administered intravenously within one hour of incision. A left-sided approach was selected due to recent right neck open surgical lymph node resection and regional residual adenopathy. Patency of the left IJ vein was confirmed with ultrasound with image documentation. An appropriate skin site was determined. Skin site was marked. Region was prepped using maximum barrier technique including cap and mask, sterile gown, sterile gloves, large sterile sheet, and Chlorhexidine as cutaneous antisepsis. The region was infiltrated locally with 1% lidocaine. Under real-time ultrasound guidance, the left IJ vein was accessed with a 21 gauge micropuncture needle; the needle tip within the vein was confirmed with ultrasound image documentation. Needle was exchanged over a 018 guidewire for transitional dilator which allowed passage of the [facility] wire into the IVC. Over this, the transitional dilator was exchanged for a 5 [place] MPA catheter. A small incision was made on the left anterior chest wall and a subcutaneous pocket fashioned. The power-injectable port was positioned and its catheter tunneled to the left IJ dermatotomy site. The MPA catheter was exchanged over an Amplatz wire for a peel-away sheath, through which the port catheter, which had been trimmed to the appropriate length, was advanced and positioned under fluoroscopy with its tip at the cavoatrial junction. Spot chest radiograph confirms good catheter position and no pneumothorax. The pocket was closed with deep interrupted and subcuticular continuous 3-0 Monocryl sutures. The port was flushed per protocol. The incisions were covered with Dermabond then covered with a sterile dressing. COMPLICATIONS: COMPLICATIONS None immediate IMPRESSION: Technically successful  left IJ power-injectable port catheter placement. Ready for routine use. Electronically Signed   By: Lucrezia Europe M.D.   On: 07/29/2017 14:25    ASSESSMENT & PLAN:   #1 Stage IIA diffuse large B-cell lymphoma of the right side of the neck with evidence of bulky  disease. Echo showed normal ejection fraction of 60-65% with grade 1 diastolic dysfunction. Plan --Labs are stable today. Patient reports no prohibitive toxicity from his first cycle of R CHOP with Neulasta. -He appears to have had excellent response to treatment with significant improvement in his right neck mass and right facial swelling. -Chemotherapy counseling was informative and helpful, per pt's daughter.  -No issues with his port that was placed on 8/22. He has some ecchymosis surrounding this today, 08/11/17, but seems to be healing well since his treatment. - I encouraged them to use Emla cream to assist with this in the future and to apply this one hour before his next treatments. He was given prescription for this.  -He tolerated his last chemotherapy infusion well, he will return in two weeks for his second cycle if his counts remain stable.  -Neulasta injection was tolerated well following his treatment.  -continue f/u with PCP .Koirala, Dibas, MD for mx of other medical co-morbidities. -I encouraged him to increase his fluid intake. -We discussed at length appropriate infection prevention strategies to avoid delays in treatment due to fevers especially since her getting into the flu season .  RTC with Dr Lebron Conners with C2D1 of R-CHOP with labs Please schedule C2 and C3 of R-CHOP with neulasta  I spent 20 minutes counseling the patient face to face. The total time spent in the appointment was 25 minutes and more than 50% was on counseling and direct patient cares.    Sullivan Lone MD [place] AAHIVMS [facility] Hematology/Oncology Physician [facility]  (Office):       [phone] (Work cell):  [phone] (Fax):            [phone]  This document serves as a record of services personally performed by Sullivan Lone, MD. It was created on his behalf by Reola Mosher, a trained medical scribe. The creation of this record is based on the scribe's personal observations and the provider's statements to them. This document has been checked and approved by the attending provider.

## 2017-08-11 NOTE — Telephone Encounter (Signed)
Gave patient avs and calendar for 9/14 and October need confirmation regarding 9/14 infusion

## 2017-08-12 ENCOUNTER — Ambulatory Visit: Payer: Medicare Other

## 2017-08-12 ENCOUNTER — Ambulatory Visit: Payer: Medicare Other | Admitting: Hematology and Oncology

## 2017-08-14 ENCOUNTER — Telehealth: Payer: Self-pay | Admitting: Hematology and Oncology

## 2017-08-14 NOTE — Telephone Encounter (Signed)
Called patient spoke with wife regarding September and October appointments

## 2017-08-20 ENCOUNTER — Telehealth: Payer: Self-pay | Admitting: *Deleted

## 2017-08-20 NOTE — Telephone Encounter (Signed)
Oncology Nurse Navigator Documentation  Received call from patient wife asking when he should start prednisone, Rx states 80 mg daily, days 1-5 of chemotherapy.  I conferred with Dr. Lebron Conners who indicated he should start the day after chemo since he is receiving IV decadron as pre-med.  I called wife, provided clarification.  Gayleen Orem, RN, BSN, [place] [facility] Oncology Nurse [facility] at [place] [phone]

## 2017-08-21 ENCOUNTER — Ambulatory Visit (HOSPITAL_BASED_OUTPATIENT_CLINIC_OR_DEPARTMENT_OTHER): Payer: Medicare Other | Admitting: Hematology and Oncology

## 2017-08-21 ENCOUNTER — Encounter: Payer: Self-pay | Admitting: Hematology and Oncology

## 2017-08-21 ENCOUNTER — Other Ambulatory Visit (HOSPITAL_BASED_OUTPATIENT_CLINIC_OR_DEPARTMENT_OTHER): Payer: Medicare Other

## 2017-08-21 ENCOUNTER — Telehealth: Payer: Self-pay

## 2017-08-21 ENCOUNTER — Other Ambulatory Visit: Payer: Self-pay

## 2017-08-21 ENCOUNTER — Ambulatory Visit (HOSPITAL_BASED_OUTPATIENT_CLINIC_OR_DEPARTMENT_OTHER): Payer: Medicare Other

## 2017-08-21 ENCOUNTER — Telehealth: Payer: Self-pay | Admitting: Hematology and Oncology

## 2017-08-21 ENCOUNTER — Encounter: Payer: Self-pay | Admitting: *Deleted

## 2017-08-21 VITALS — BP 120/86 | HR 68 | Temp 98.3°F | Resp 18 | Ht 67.0 in | Wt 168.3 lb

## 2017-08-21 VITALS — BP 106/68 | HR 69 | Resp 16

## 2017-08-21 DIAGNOSIS — Z5111 Encounter for antineoplastic chemotherapy: Secondary | ICD-10-CM

## 2017-08-21 DIAGNOSIS — C8331 Diffuse large B-cell lymphoma, lymph nodes of head, face, and neck: Secondary | ICD-10-CM

## 2017-08-21 DIAGNOSIS — Z5112 Encounter for antineoplastic immunotherapy: Secondary | ICD-10-CM

## 2017-08-21 LAB — CBC WITH DIFFERENTIAL/PLATELET
BASO%: 1.1 % (ref 0.0–2.0)
Basophils Absolute: 0.1 10*3/uL (ref 0.0–0.1)
EOS ABS: 0.1 10*3/uL (ref 0.0–0.5)
EOS%: 1.9 % (ref 0.0–7.0)
HEMATOCRIT: 38.7 % (ref 38.4–49.9)
HEMOGLOBIN: 13.1 g/dL (ref 13.0–17.1)
LYMPH%: 10.8 % — AB (ref 14.0–49.0)
MCH: 32.2 pg (ref 27.2–33.4)
MCHC: 34 g/dL (ref 32.0–36.0)
MCV: 94.6 fL (ref 79.3–98.0)
MONO#: 1.2 10*3/uL — ABNORMAL HIGH (ref 0.1–0.9)
MONO%: 16 % — AB (ref 0.0–14.0)
NEUT%: 70.2 % (ref 39.0–75.0)
NEUTROS ABS: 5.3 10*3/uL (ref 1.5–6.5)
PLATELETS: 373 10*3/uL (ref 140–400)
RBC: 4.09 10*6/uL — AB (ref 4.20–5.82)
RDW: 13.9 % (ref 11.0–14.6)
WBC: 7.5 10*3/uL (ref 4.0–10.3)
lymph#: 0.8 10*3/uL — ABNORMAL LOW (ref 0.9–3.3)

## 2017-08-21 LAB — COMPREHENSIVE METABOLIC PANEL
ALBUMIN: 3.4 g/dL — AB (ref 3.5–5.0)
ALT: 11 U/L (ref 0–55)
ANION GAP: 8 meq/L (ref 3–11)
AST: 15 U/L (ref 5–34)
Alkaline Phosphatase: 72 U/L (ref 40–150)
BILIRUBIN TOTAL: 0.27 mg/dL (ref 0.20–1.20)
BUN: 13.9 mg/dL (ref 7.0–26.0)
CALCIUM: 9.3 mg/dL (ref 8.4–10.4)
CO2: 26 meq/L (ref 22–29)
CREATININE: 0.9 mg/dL (ref 0.7–1.3)
Chloride: 108 mEq/L (ref 98–109)
EGFR: 86 mL/min/{1.73_m2} — ABNORMAL LOW (ref >90)
Glucose: 85 mg/dl (ref 70–140)
Potassium: 4.2 mEq/L (ref 3.5–5.1)
Sodium: 143 mEq/L (ref 136–145)
TOTAL PROTEIN: 7.1 g/dL (ref 6.4–8.3)

## 2017-08-21 LAB — LACTATE DEHYDROGENASE: LDH: 215 U/L (ref 125–245)

## 2017-08-21 LAB — MAGNESIUM: Magnesium: 2.4 mg/dl (ref 1.5–2.5)

## 2017-08-21 LAB — URIC ACID: Uric Acid, Serum: 6.4 mg/dl (ref 2.6–7.4)

## 2017-08-21 MED ORDER — DEXAMETHASONE SODIUM PHOSPHATE 10 MG/ML IJ SOLN
INTRAMUSCULAR | Status: AC
  Filled 2017-08-21: qty 1

## 2017-08-21 MED ORDER — FAMOTIDINE IN NACL 20-0.9 MG/50ML-% IV SOLN
INTRAVENOUS | Status: AC
  Filled 2017-08-21: qty 50

## 2017-08-21 MED ORDER — DOXORUBICIN HCL CHEMO IV INJECTION 2 MG/ML
50.0000 mg/m2 | Freq: Once | INTRAVENOUS | Status: AC
  Administered 2017-08-21: 96 mg via INTRAVENOUS
  Filled 2017-08-21: qty 48

## 2017-08-21 MED ORDER — PALONOSETRON HCL INJECTION 0.25 MG/5ML
0.2500 mg | Freq: Once | INTRAVENOUS | Status: AC
  Administered 2017-08-21: 0.25 mg via INTRAVENOUS

## 2017-08-21 MED ORDER — DIPHENHYDRAMINE HCL 25 MG PO CAPS
25.0000 mg | ORAL_CAPSULE | Freq: Once | ORAL | Status: AC
  Administered 2017-08-21: 25 mg via ORAL

## 2017-08-21 MED ORDER — HEPARIN SOD (PORK) LOCK FLUSH 100 UNIT/ML IV SOLN
500.0000 [IU] | Freq: Once | INTRAVENOUS | Status: AC | PRN
  Administered 2017-08-21: 500 [IU]
  Filled 2017-08-21: qty 5

## 2017-08-21 MED ORDER — SODIUM CHLORIDE 0.9 % IV SOLN
750.0000 mg/m2 | Freq: Once | INTRAVENOUS | Status: AC
  Administered 2017-08-21: 1440 mg via INTRAVENOUS
  Filled 2017-08-21: qty 72

## 2017-08-21 MED ORDER — VINCRISTINE SULFATE CHEMO INJECTION 1 MG/ML
2.0000 mg | Freq: Once | INTRAVENOUS | Status: AC
  Administered 2017-08-21: 2 mg via INTRAVENOUS
  Filled 2017-08-21: qty 2

## 2017-08-21 MED ORDER — RITUXIMAB CHEMO INJECTION 500 MG/50ML
375.0000 mg/m2 | Freq: Once | INTRAVENOUS | Status: AC
  Administered 2017-08-21: 700 mg via INTRAVENOUS
  Filled 2017-08-21: qty 50

## 2017-08-21 MED ORDER — PALONOSETRON HCL INJECTION 0.25 MG/5ML
INTRAVENOUS | Status: AC
  Filled 2017-08-21: qty 5

## 2017-08-21 MED ORDER — SODIUM CHLORIDE 0.9% FLUSH
10.0000 mL | INTRAVENOUS | Status: DC | PRN
  Administered 2017-08-21: 10 mL
  Filled 2017-08-21: qty 10

## 2017-08-21 MED ORDER — DIPHENHYDRAMINE HCL 25 MG PO CAPS
ORAL_CAPSULE | ORAL | Status: AC
  Filled 2017-08-21: qty 1

## 2017-08-21 MED ORDER — DEXAMETHASONE SODIUM PHOSPHATE 10 MG/ML IJ SOLN
10.0000 mg | Freq: Once | INTRAMUSCULAR | Status: AC
  Administered 2017-08-21: 10 mg via INTRAVENOUS

## 2017-08-21 MED ORDER — ACETAMINOPHEN 325 MG PO TABS
650.0000 mg | ORAL_TABLET | Freq: Once | ORAL | Status: AC
  Administered 2017-08-21: 650 mg via ORAL

## 2017-08-21 MED ORDER — ACETAMINOPHEN 325 MG PO TABS
ORAL_TABLET | ORAL | Status: AC
  Filled 2017-08-21: qty 2

## 2017-08-21 MED ORDER — FAMOTIDINE IN NACL 20-0.9 MG/50ML-% IV SOLN
20.0000 mg | Freq: Once | INTRAVENOUS | Status: AC
  Administered 2017-08-21: 20 mg via INTRAVENOUS

## 2017-08-21 MED ORDER — SODIUM CHLORIDE 0.9 % IV SOLN
Freq: Once | INTRAVENOUS | Status: AC
  Administered 2017-08-21: 11:00:00 via INTRAVENOUS

## 2017-08-21 NOTE — Assessment & Plan Note (Signed)
71 year old male with diagnosis of activated B-cell diffuse large B-cell lymphoma of the lymph nodes of the right side of the neck. Currently available staging information suggests presence of stage II bulky disease. The stomach diagnosis, patient was started on R-CHOP chemotherapy and has completed initial cycle without significant complications. There has been significant reduction in the bulk of the lymphoma by examination.  Clinical evaluation and lab work today permissive to proceed with the second cycle of therapy. Patient will be planned for 6 cycles of treatment, subsequently, I recommend patient to undergo possible adjuvant involved field radiation therapy due to initial bulky disease in the neck.  Plan: --Proceed with the second cycle of systemic chemotherapy with R-CHOP

## 2017-08-21 NOTE — Progress Notes (Signed)
[facility] Follow-up Visit:  Assessment: Diffuse large B-cell lymphoma of lymph nodes of neck ([facility]) 71 year old male with diagnosis of activated B-cell diffuse large B-cell lymphoma of the lymph nodes of the right side of the neck. Currently available staging information suggests presence of stage II bulky disease. The stomach diagnosis, patient was started on R-CHOP chemotherapy and has completed initial cycle without significant complications. There has been significant reduction in the bulk of the lymphoma by examination.  Clinical evaluation and lab work today permissive to proceed with the second cycle of therapy. Patient will be planned for 6 cycles of treatment, subsequently, I recommend patient to undergo possible adjuvant involved field radiation therapy due to initial bulky disease in the neck.  Plan: --Proceed with the second cycle of systemic chemotherapy with R-CHOP  --PET/CT prior to return to the clinic --return to clinic in 3 weeks: Labs, clinic visit, possible third cycle of systemic chemotherapy  Voice recognition software was used and creation of this note. Despite my best effort at editing the text, some misspelling/errors may have occurred.  Orders Placed This Encounter  Procedures  . NM PET Image Restag (PS) Skull Base To Thigh    Standing Status:   Future    Standing Expiration Date:   08/21/2018    Order Specific Question:   If indicated for the ordered procedure, I authorize the administration of a radiopharmaceutical per Radiology protocol    Answer:   Yes    Order Specific Question:   Preferred imaging location?    Answer:   [facility]    Order Specific Question:   Radiology Contrast Protocol - do NOT remove file path    Answer:   [email or web address]    Order Specific Question:   Reason for Exam additional comments    Answer:   Diffuse large B-cell lymphoma involving lymph nodes of the right neck. Please  evaluate response to therapy.  Marland Kitchen CBC with Differential    Standing Status:   Future    Standing Expiration Date:   08/21/2018  . Comprehensive metabolic panel    Standing Status:   Future    Standing Expiration Date:   08/21/2018  . Lactate dehydrogenase (LDH)    Standing Status:   Future    Standing Expiration Date:   08/21/2018  . Uric acid    Standing Status:   Future    Standing Expiration Date:   08/21/2018  . Magnesium    Standing Status:   Future    Standing Expiration Date:   08/21/2018    Cancer Staging Diffuse large B-cell lymphoma of lymph nodes of neck (HCC) Staging form: Hodgkin and Non-Hodgkin Lymphoma, AJCC 8th Edition - Clinical stage from 07/13/2017: Stage II bulky (Diffuse large B-cell lymphoma) - Signed by Todd Sax, MD on 07/20/2017   All questions were answered.  . The patient knows to call the clinic with any problems, questions or concerns.  This note was electronically signed.    History of Presenting Illness Todd Bell is a  71 y.o. [place] male follwed in the [facility] for diagnosis of diffuse large B-cell lymphoma.  Please see oncologic history below for details of the diagnosis. Patient has initially discovered asymmetry in his neck with visible mass earlier in July. He was seen by his primary care provider and subsequently directly to be evaluated by ENT. Patient underwent initially ultrasound of the neck demonstrating a large mass, subsequently he underwent CT of the neck on 06/15/17  demonstrating normal laryngeal, pharyngeal, salivary structures and a 5.0 cm supraclavicular mass on the right side. CT of the chest obtained the same time demonstrated mass, but no lymphadenopathy in the mediastinum, hilar structures, and a 0.4 cm right upper lobe nodule and a 0.5 cm left upper lobe nodule. Patient underwent an FNA on 06/17/17 which demonstrated presence of malignant cells that could not be further characterized due to insufficient sample.  Initially, patient denied any significant symptoms in association with this mass. He denied any fevers, chills, night sweats. No significant weight loss. No changes to his activities of daily living or activity tolerance. Denied changes to his appetite. Denied chest pain, shortness of breath or cough. Todd Bell had no nausea, early satiety, abdominal pain, diarrhea, or constipation. No dysuria or hematuria. Denied any cutaneous changes. Denied any vision changes, hearing changes, swallowing difficulties, any focal weakness or sensory deficits in the face or extremities.  Based on diagnosis, patient has been initiated on curative-intent chemotherapy with R-CHOP 3 weeks ago. Patient tolerated the infusion without any complications. Then 3 days, he has noted significant improvement in the size of the right cervical mass and within 5 days he noticed near complete resolution of the abnormality. He did not have any breakthrough nausea. Did not have any interval fevers, chills or night sweats. He also denies any interval increase in shortness of breath or swelling in the lower extremities. No new neuropathy.  Oncological/hematological History:   Diffuse large B-cell lymphoma of lymph nodes of neck ([place])   06/15/2017 Imaging    CT Neck/Chest: Normal laryngeal, pharyngeal, salivary structures and a 5.0 cm supraclavicular mass on the right side.No lymphadenopathy in the mediastinum, hilar structures, and a 0.4 cm right upper lobe nodule and a 0.5 cm left upper lobe nodule.       06/30/2017 Imaging    PET-CT: Highly hypermetabolic right conglomerate supraclavicular mass. This is accompanied by accentuated metabolic activity in the palatine tonsillar pillars, a hypermetabolic right buccinator lymph node, hypermetabolic thoracic lymph nodes including a mildly enlarged AP window lymph node and other upper normal size but mildly hypermetabolic hilar and infrahilar nodes. Appearance compatible with malignancy such as  lymphoma or adenocarcinoma      07/13/2017 Pathology Results    Rt Neck mass core bx: Sheets of large lymphocytes discernible lymph node structure, consistent with diffuse large B-cell lymphoma. IHC -- positive for LCA, CD20, CD79a, PAX-5, BCL-6 & negative for CD10, CD34, BCL-2, CD30, CD138, kappa or lambda light chains      07/17/2017 Initial Diagnosis    Diffuse large B-cell lymphoma of lymph nodes of neck ([place])     07/29/2017 Procedure    Placement of left IJ power-injectable port catheter.       07/31/2017 -  Chemotherapy    R-CHOP21: Rituximab 328m/m2, d1 + Cyclophosphamide 7524mm2, d1 + Doxorubicin 507m2, d1 + Vincristine 2mg25m1 + Prednisone 60mg43mQDay, d1-5 Q21 d x6 cycles planned --Cycle #1, 07/31/17: --Cycle #2, 08/21/17:        Medical History: Past Medical History:  Diagnosis Date  . Medical history non-contributory     Surgical History: Past Surgical History:  Procedure Laterality Date  . CATARACT EXTRACTION    . COLONOSCOPY  02/2016  . IR FLUORO GUIDE PORT INSERTION LEFT  07/29/2017  . IR US [place] VASC ACCESS LEFT  07/29/2017  . LYMPH NODE BIOPSY Right 07/13/2017   Procedure: OPEN NECK LYMPH NODE BIOPSY RIGHT SIDE;  Surgeon: RosenIzora Gala  Location: [facility];  Service: ENT;  Laterality: Right;    Family History: Family History  Problem Relation Age of Onset  . Cancer Father        Pancreatic    Social History: Social History   Social History  . Marital status: Married    Spouse name: N/A  . Number of children: N/A  . Years of education: N/A   Occupational History  . Not on file.   Social History Main Topics  . Smoking status: Former Smoker    Quit date: 1988  . Smokeless tobacco: Never Used     Comment: he was a social drinker  . Alcohol use Yes     Comment: occasional  . Drug use: No  . Sexual activity: Not on file   Other Topics Concern  . Not on file   Social History Narrative  . No narrative on file     Allergies: No Known Allergies  Medications:  Current Outpatient Prescriptions  Medication Sig Dispense Refill  . allopurinol (ZYLOPRIM) 100 MG tablet Take 1 tablet (100 mg total) by mouth 2 (two) times daily. 30 tablet 0  . aspirin EC 81 MG tablet Take 81 mg by mouth daily.    . cephALEXin (KEFLEX) 500 MG capsule Take 1 capsule (500 mg total) by mouth 3 (three) times daily. 15 capsule 0  . lidocaine-prilocaine (EMLA) cream Apply 1 application topically as needed. 30 g 6  . ondansetron (ZOFRAN) 8 MG tablet Take 1 tablet (8 mg total) by mouth 2 (two) times daily as needed for refractory nausea / vomiting. Start on day 3 after cyclophosphamide. 30 tablet 1  . prochlorperazine (COMPAZINE) 10 MG tablet Take 1 tablet (10 mg total) by mouth every 6 (six) hours as needed (Nausea or vomiting). 30 tablet 6   No current facility-administered medications for this visit.    Facility-Administered Medications Ordered in Other Visits  Medication Dose Route Frequency Provider Last Rate Last Dose  . cyclophosphamide (CYTOXAN) 1,440 mg in sodium chloride 0.9 % 250 mL chemo infusion  750 mg/m2 (Treatment Plan Recorded) Intravenous Once Todd Sax, MD 644 mL/hr at 08/21/17 1235 1,440 mg at 08/21/17 1235  . heparin lock flush 100 unit/mL  500 Units Intracatheter Once PRN Todd Sax, MD      . riTUXimab (RITUXAN) 700 mg in sodium chloride 0.9 % 180 mL chemo infusion  375 mg/m2 (Treatment Plan Recorded) Intravenous Once Lynetta Tomczak Darnell Level, MD      . sodium chloride flush (NS) 0.9 % injection 10 mL  10 mL Intracatheter PRN Demiah Gullickson, Marinell Blight, MD        Review of Systems: Review of Systems  All other systems reviewed and are negative.    PHYSICAL EXAMINATION Blood pressure 120/86, pulse 68, temperature 98.3 F (36.8 C), temperature source Oral, resp. rate 18, height 5' 7"  (1.702 m), weight 168 lb 4.8 oz (76.3 kg), SpO2 98 %.  ECOG PERFORMANCE STATUS: 1 - Symptomatic but completely  ambulatory  Physical Exam  Constitutional: He is oriented to person, place, and time and well-developed, well-nourished, and in no distress. Vital signs are normal. He appears not malnourished, not dehydrated and not jaundiced. He does not have a sickly appearance. No distress.  HENT:  Mouth/Throat: Uvula is midline, oropharynx is clear and moist and mucous membranes are normal. Mucous membranes are not pale, not dry and not cyanotic. He has dentures. Normal dentition.  Eyes: Pupils are equal, round, and reactive to light. Conjunctivae and  EOM are normal. No scleral icterus.  Neck: No JVD present. Carotid bruit is not present.  Large mass is visibly present over the middle and lower aspects of the right neck. It appears to be firm to palpation with a regular surface of hard texture. It appears to be adherent to the underlying structures.  Cardiovascular: Normal rate, regular rhythm and normal heart sounds.   No murmur heard. Pulmonary/Chest: Effort normal and breath sounds normal. He has no decreased breath sounds. He has no wheezes. He has no rhonchi. He has no rales.  Abdominal: Soft. Normal appearance and normal aorta. There is no hepatosplenomegaly. There is no tenderness.  Lymphadenopathy:       Head (right side): No submental, no submandibular, no tonsillar and no occipital adenopathy present.       Head (left side): No submental, no submandibular, no tonsillar and no occipital adenopathy present.    He has no cervical adenopathy.       Right cervical: No posterior cervical adenopathy present.      Left cervical: No superficial cervical, no deep cervical and no posterior cervical adenopathy present.    He has no axillary adenopathy.       Right: No inguinal and no supraclavicular adenopathy present.       Left: No inguinal and no supraclavicular adenopathy present.  Interval near complete resolution of the previously palpable bulky mass in the right lower neck/supraclavicular fossa.   Neurological: He is alert and oriented to person, place, and time. He has normal sensation, normal strength, normal reflexes and intact cranial nerves.  Skin: He is not diaphoretic.     LABORATORY DATA: I have personally reviewed the data as listed: Appointment on 08/21/2017  Component Date Value Ref Range Status  . WBC 08/21/2017 7.5  4.0 - 10.3 10e3/uL Final  . NEUT# 08/21/2017 5.3  1.5 - 6.5 10e3/uL Final  . HGB 08/21/2017 13.1  13.0 - 17.1 g/dL Final  . HCT 08/21/2017 38.7  38.4 - 49.9 % Final  . Platelets 08/21/2017 373  140 - 400 10e3/uL Final  . MCV 08/21/2017 94.6  79.3 - 98.0 fL Final  . MCH 08/21/2017 32.2  27.2 - 33.4 pg Final  . MCHC 08/21/2017 34.0  32.0 - 36.0 g/dL Final  . RBC 08/21/2017 4.09* 4.20 - 5.82 10e6/uL Final  . RDW 08/21/2017 13.9  11.0 - 14.6 % Final  . lymph# 08/21/2017 0.8* 0.9 - 3.3 10e3/uL Final  . MONO# 08/21/2017 1.2* 0.1 - 0.9 10e3/uL Final  . Eosinophils Absolute 08/21/2017 0.1  0.0 - 0.5 10e3/uL Final  . Basophils Absolute 08/21/2017 0.1  0.0 - 0.1 10e3/uL Final  . NEUT% 08/21/2017 70.2  39.0 - 75.0 % Final  . LYMPH% 08/21/2017 10.8* 14.0 - 49.0 % Final  . MONO% 08/21/2017 16.0* 0.0 - 14.0 % Final  . EOS% 08/21/2017 1.9  0.0 - 7.0 % Final  . BASO% 08/21/2017 1.1  0.0 - 2.0 % Final  . Sodium 08/21/2017 143  136 - 145 mEq/L Final  . Potassium 08/21/2017 4.2  3.5 - 5.1 mEq/L Final  . Chloride 08/21/2017 108  98 - 109 mEq/L Final  . CO2 08/21/2017 26  22 - 29 mEq/L Final  . Glucose 08/21/2017 85  70 - 140 mg/dl Final   Glucose reference range is for nonfasting patients. Fasting glucose reference range is 70- 100.  Marland Kitchen BUN 08/21/2017 13.9  7.0 - 26.0 mg/dL Final  . Creatinine 08/21/2017 0.9  0.7 - 1.3 mg/dL Final  .  Total Bilirubin 08/21/2017 0.27  0.20 - 1.20 mg/dL Final  . Alkaline Phosphatase 08/21/2017 72  40 - 150 U/L Final  . AST 08/21/2017 15  5 - 34 U/L Final  . ALT 08/21/2017 11  0 - 55 U/L Final  . Total Protein 08/21/2017 7.1  6.4 - 8.3  g/dL Final  . Albumin 08/21/2017 3.4* 3.5 - 5.0 g/dL Final  . Calcium 08/21/2017 9.3  8.4 - 10.4 mg/dL Final  . Anion Gap 08/21/2017 8  3 - 11 mEq/L Final  . EGFR 08/21/2017 86* >90 ml/min/1.73 m2 Final   eGFR is calculated using the CKD-EPI Creatinine Equation (2009)  . LDH 08/21/2017 215  125 - 245 U/L Final  . Magnesium 08/21/2017 2.4  1.5 - 2.5 mg/dl Final  . Uric Acid, Serum 08/21/2017 6.4  2.6 - 7.4 mg/dl Final       Todd Sax, MD

## 2017-08-21 NOTE — Telephone Encounter (Signed)
Gave avs and calendar for October  °

## 2017-08-21 NOTE — Patient Instructions (Signed)
[facility] Discharge Instructions for Patients Receiving Chemotherapy  Today you received the following chemotherapy agents Rituxan,Adriamycin, Oncovin and Cytoxan  To help prevent nausea and vomiting after your treatment, we encourage you to take your nausea medication as directed.  If you develop nausea and vomiting that is not controlled by your nausea medication, call the clinic.   BELOW ARE SYMPTOMS THAT SHOULD BE REPORTED IMMEDIATELY:  *FEVER GREATER THAN 100.5 F  *CHILLS WITH OR WITHOUT FEVER  NAUSEA AND VOMITING THAT IS NOT CONTROLLED WITH YOUR NAUSEA MEDICATION  *UNUSUAL SHORTNESS OF BREATH  *UNUSUAL BRUISING OR BLEEDING  TENDERNESS IN MOUTH AND THROAT WITH OR WITHOUT PRESENCE OF ULCERS  *URINARY PROBLEMS  *BOWEL PROBLEMS  UNUSUAL RASH Items with * indicate a potential emergency and should be followed up as soon as possible.  Feel free to call the clinic you have any questions or concerns. The clinic phone number is [phone].  Please show the [place] at check-in to the Emergency Department and triage nurse.

## 2017-08-21 NOTE — Telephone Encounter (Signed)
Talked to wife. No more neulasta injections per Dr. Lebron Conners. Pharmacy aware and removing medication orders in the future. Injection appointments cancelled and pt wife notified. Pt wife verbalized understanding.

## 2017-08-24 ENCOUNTER — Ambulatory Visit: Payer: Medicare Other

## 2017-08-25 NOTE — Progress Notes (Signed)
Oncology Nurse Navigator Documentation  Spoke with [facility] in Infusion where he was receiving 2nd cycle RCHOP. He stated tolerance with infusion, denied concerns/needs. He voiced understanding I can be contacted.  Gayleen Orem, RN, BSN, [place] Oncology Nurse [place] at [place] [phone]

## 2017-08-31 ENCOUNTER — Encounter (HOSPITAL_COMMUNITY): Payer: Self-pay

## 2017-09-03 ENCOUNTER — Ambulatory Visit (HOSPITAL_COMMUNITY)
Admission: RE | Admit: 2017-09-03 | Discharge: 2017-09-03 | Disposition: A | Discharge status: Home / Self Care | Payer: Medicare Other | Source: Ambulatory Visit | Attending: Hematology and Oncology | Admitting: Hematology and Oncology

## 2017-09-03 DIAGNOSIS — C8590 Non-Hodgkin lymphoma, unspecified, unspecified site: Secondary | ICD-10-CM | POA: Diagnosis not present

## 2017-09-03 DIAGNOSIS — C8331 Diffuse large B-cell lymphoma, lymph nodes of head, face, and neck: Secondary | ICD-10-CM | POA: Insufficient documentation

## 2017-09-03 DIAGNOSIS — R590 Localized enlarged lymph nodes: Secondary | ICD-10-CM | POA: Diagnosis not present

## 2017-09-03 LAB — GLUCOSE, CAPILLARY: Glucose-Capillary: 95 mg/dL (ref 65–99)

## 2017-09-03 MED ORDER — FLUDEOXYGLUCOSE F - 18 (FDG) INJECTION
7.7000 | Freq: Once | INTRAVENOUS | Status: AC | PRN
  Administered 2017-09-03: 7.7 via INTRAVENOUS

## 2017-09-04 LAB — CHROMOSOME ANALYSIS, BONE MARROW

## 2017-09-11 ENCOUNTER — Telehealth: Payer: Self-pay | Admitting: Hematology and Oncology

## 2017-09-11 ENCOUNTER — Encounter: Payer: Self-pay | Admitting: Hematology and Oncology

## 2017-09-11 ENCOUNTER — Ambulatory Visit (HOSPITAL_BASED_OUTPATIENT_CLINIC_OR_DEPARTMENT_OTHER): Payer: Medicare Other

## 2017-09-11 ENCOUNTER — Ambulatory Visit (HOSPITAL_BASED_OUTPATIENT_CLINIC_OR_DEPARTMENT_OTHER): Payer: Medicare Other | Admitting: Hematology and Oncology

## 2017-09-11 ENCOUNTER — Encounter: Payer: Self-pay | Admitting: *Deleted

## 2017-09-11 ENCOUNTER — Other Ambulatory Visit (HOSPITAL_BASED_OUTPATIENT_CLINIC_OR_DEPARTMENT_OTHER): Payer: Medicare Other

## 2017-09-11 VITALS — BP 105/72 | HR 57 | Temp 97.9°F | Resp 18

## 2017-09-11 DIAGNOSIS — Z5112 Encounter for antineoplastic immunotherapy: Secondary | ICD-10-CM

## 2017-09-11 DIAGNOSIS — C8331 Diffuse large B-cell lymphoma, lymph nodes of head, face, and neck: Secondary | ICD-10-CM

## 2017-09-11 DIAGNOSIS — Z5111 Encounter for antineoplastic chemotherapy: Secondary | ICD-10-CM

## 2017-09-11 LAB — COMPREHENSIVE METABOLIC PANEL
ALK PHOS: 61 U/L (ref 40–150)
ALT: 14 U/L (ref 0–55)
ANION GAP: 7 meq/L (ref 3–11)
AST: 17 U/L (ref 5–34)
Albumin: 3.6 g/dL (ref 3.5–5.0)
BILIRUBIN TOTAL: 0.28 mg/dL (ref 0.20–1.20)
BUN: 14.5 mg/dL (ref 7.0–26.0)
CALCIUM: 9.2 mg/dL (ref 8.4–10.4)
CO2: 26 mEq/L (ref 22–29)
CREATININE: 0.9 mg/dL (ref 0.7–1.3)
Chloride: 108 mEq/L (ref 98–109)
EGFR: 81 mL/min/{1.73_m2} — AB (ref >90)
Glucose: 100 mg/dl (ref 70–140)
Potassium: 4.4 mEq/L (ref 3.5–5.1)
Sodium: 142 mEq/L (ref 136–145)
TOTAL PROTEIN: 6.6 g/dL (ref 6.4–8.3)

## 2017-09-11 LAB — MAGNESIUM: Magnesium: 2.3 mg/dl (ref 1.5–2.5)

## 2017-09-11 LAB — CBC WITH DIFFERENTIAL/PLATELET
BASO%: 1.5 % (ref 0.0–2.0)
Basophils Absolute: 0.1 10*3/uL (ref 0.0–0.1)
EOS ABS: 0.1 10*3/uL (ref 0.0–0.5)
EOS%: 3.2 % (ref 0.0–7.0)
HEMATOCRIT: 37.8 % — AB (ref 38.4–49.9)
HGB: 12.7 g/dL — ABNORMAL LOW (ref 13.0–17.1)
LYMPH#: 0.9 10*3/uL (ref 0.9–3.3)
LYMPH%: 21.4 % (ref 14.0–49.0)
MCH: 31.7 pg (ref 27.2–33.4)
MCHC: 33.5 g/dL (ref 32.0–36.0)
MCV: 94.7 fL (ref 79.3–98.0)
MONO#: 1.2 10*3/uL — AB (ref 0.1–0.9)
MONO%: 29.3 % — ABNORMAL HIGH (ref 0.0–14.0)
NEUT%: 44.6 % (ref 39.0–75.0)
NEUTROS ABS: 1.8 10*3/uL (ref 1.5–6.5)
PLATELETS: 281 10*3/uL (ref 140–400)
RBC: 3.99 10*6/uL — ABNORMAL LOW (ref 4.20–5.82)
RDW: 14.6 % (ref 11.0–14.6)
WBC: 4.1 10*3/uL (ref 4.0–10.3)

## 2017-09-11 LAB — URIC ACID: URIC ACID, SERUM: 6.3 mg/dL (ref 2.6–7.4)

## 2017-09-11 LAB — LACTATE DEHYDROGENASE: LDH: 187 U/L (ref 125–245)

## 2017-09-11 MED ORDER — SODIUM CHLORIDE 0.9 % IV SOLN
375.0000 mg/m2 | Freq: Once | INTRAVENOUS | Status: AC
  Administered 2017-09-11: 700 mg via INTRAVENOUS
  Filled 2017-09-11: qty 20

## 2017-09-11 MED ORDER — SODIUM CHLORIDE 0.9 % IV SOLN
Freq: Once | INTRAVENOUS | Status: AC
  Administered 2017-09-11: 09:00:00 via INTRAVENOUS

## 2017-09-11 MED ORDER — DIPHENHYDRAMINE HCL 25 MG PO CAPS
25.0000 mg | ORAL_CAPSULE | Freq: Once | ORAL | Status: AC
  Administered 2017-09-11: 25 mg via ORAL

## 2017-09-11 MED ORDER — DEXAMETHASONE SODIUM PHOSPHATE 10 MG/ML IJ SOLN
10.0000 mg | Freq: Once | INTRAMUSCULAR | Status: AC
  Administered 2017-09-11: 10 mg via INTRAVENOUS

## 2017-09-11 MED ORDER — ACETAMINOPHEN 325 MG PO TABS
ORAL_TABLET | ORAL | Status: AC
  Filled 2017-09-11: qty 2

## 2017-09-11 MED ORDER — PALONOSETRON HCL INJECTION 0.25 MG/5ML
0.2500 mg | Freq: Once | INTRAVENOUS | Status: AC
  Administered 2017-09-11: 0.25 mg via INTRAVENOUS

## 2017-09-11 MED ORDER — ACETAMINOPHEN 325 MG PO TABS
ORAL_TABLET | ORAL | Status: AC
  Filled 2017-09-11: qty 1

## 2017-09-11 MED ORDER — HEPARIN SOD (PORK) LOCK FLUSH 100 UNIT/ML IV SOLN
500.0000 [IU] | Freq: Once | INTRAVENOUS | Status: AC | PRN
  Administered 2017-09-11: 500 [IU]
  Filled 2017-09-11: qty 5

## 2017-09-11 MED ORDER — FAMOTIDINE IN NACL 20-0.9 MG/50ML-% IV SOLN
20.0000 mg | Freq: Once | INTRAVENOUS | Status: AC
  Administered 2017-09-11: 20 mg via INTRAVENOUS

## 2017-09-11 MED ORDER — SODIUM CHLORIDE 0.9% FLUSH
10.0000 mL | INTRAVENOUS | Status: DC | PRN
  Administered 2017-09-11: 10 mL
  Filled 2017-09-11: qty 10

## 2017-09-11 MED ORDER — DEXAMETHASONE SODIUM PHOSPHATE 10 MG/ML IJ SOLN
INTRAMUSCULAR | Status: AC
  Filled 2017-09-11: qty 1

## 2017-09-11 MED ORDER — DOXORUBICIN HCL CHEMO IV INJECTION 2 MG/ML
50.0000 mg/m2 | Freq: Once | INTRAVENOUS | Status: AC
  Administered 2017-09-11: 96 mg via INTRAVENOUS
  Filled 2017-09-11: qty 48

## 2017-09-11 MED ORDER — SODIUM CHLORIDE 0.9 % IV SOLN
750.0000 mg/m2 | Freq: Once | INTRAVENOUS | Status: AC
  Administered 2017-09-11: 1440 mg via INTRAVENOUS
  Filled 2017-09-11: qty 72

## 2017-09-11 MED ORDER — DIPHENHYDRAMINE HCL 25 MG PO CAPS
ORAL_CAPSULE | ORAL | Status: AC
  Filled 2017-09-11: qty 1

## 2017-09-11 MED ORDER — FAMOTIDINE IN NACL 20-0.9 MG/50ML-% IV SOLN
INTRAVENOUS | Status: AC
  Filled 2017-09-11: qty 50

## 2017-09-11 MED ORDER — VINCRISTINE SULFATE CHEMO INJECTION 1 MG/ML
2.0000 mg | Freq: Once | INTRAVENOUS | Status: AC
  Administered 2017-09-11: 2 mg via INTRAVENOUS
  Filled 2017-09-11: qty 2

## 2017-09-11 MED ORDER — ACETAMINOPHEN 325 MG PO TABS
650.0000 mg | ORAL_TABLET | Freq: Once | ORAL | Status: AC
  Administered 2017-09-11: 650 mg via ORAL

## 2017-09-11 MED ORDER — PALONOSETRON HCL INJECTION 0.25 MG/5ML
INTRAVENOUS | Status: AC
  Filled 2017-09-11: qty 5

## 2017-09-11 NOTE — Patient Instructions (Signed)
[facility] Discharge Instructions for Patients Receiving Chemotherapy  Today you received the following chemotherapy agents Rituxan,Adriamycin, Oncovin and Cytoxan  To help prevent nausea and vomiting after your treatment, we encourage you to take your nausea medication as directed.  If you develop nausea and vomiting that is not controlled by your nausea medication, call the clinic.   BELOW ARE SYMPTOMS THAT SHOULD BE REPORTED IMMEDIATELY:  *FEVER GREATER THAN 100.5 F  *CHILLS WITH OR WITHOUT FEVER  NAUSEA AND VOMITING THAT IS NOT CONTROLLED WITH YOUR NAUSEA MEDICATION  *UNUSUAL SHORTNESS OF BREATH  *UNUSUAL BRUISING OR BLEEDING  TENDERNESS IN MOUTH AND THROAT WITH OR WITHOUT PRESENCE OF ULCERS  *URINARY PROBLEMS  *BOWEL PROBLEMS  UNUSUAL RASH Items with * indicate a potential emergency and should be followed up as soon as possible.  Feel free to call the clinic you have any questions or concerns. The clinic phone number is [phone].  Please show the [facility] at check-in to the Emergency Department and triage nurse.

## 2017-09-11 NOTE — Progress Notes (Signed)
Oncology Nurse Navigator Documentation  To provide support, encouragement and care continuity, met with [facility] Bell during est pt appt with Dr. Lebron Conners.  His wife and dtr accompanied him They voiced understanding of favorable 9/27 PET results, plan to continue with 4 additional cycles chemo including today's infusion, followed by RT. Dr. Lebron Conners provided guidance to have flu vaccination 2 wks after today's infusion. They were encouraged to call me with needs/concerns.  Gayleen Orem, RN, BSN, [place] [facility] Oncology Nurse [facility] at [place] [phone]

## 2017-09-11 NOTE — Telephone Encounter (Signed)
Scheduled appt per 10/5 los - Gave patient AVS and calender per los.  

## 2017-09-14 ENCOUNTER — Ambulatory Visit: Payer: Medicare Other

## 2017-09-15 NOTE — Progress Notes (Signed)
[name]:  Assessment: Diffuse large B-cell lymphoma of lymph nodes of neck ([place]) 71 y.o. male with diagnosis of activated B-cell diffuse large B-cell lymphoma of the lymph nodes of the right side of the neck. Currently available staging information suggests presence of stage II bulky disease. Based on the diagnosis, patient was started on R-CHOP chemotherapy and has completed 2 cycles of therapy without significant complications sofar. There has been significant reduction in the bulk of the lymphoma by examination. Interval PET/CT demonstrates strong partial response, although we still see an area of increased FDG uptake in the main disease focus.  Clinical evaluation and lab work today permissive to proceed with the third cycle of therapy. Patient will be planned for 6 cycles of treatment, subsequently, I recommend patient to undergo possible adjuvant involved field radiation therapy due to initial bulky disease in the neck as well as only partial response to the initiation of therapy.  Plan: --Proceed with the third cycle of systemic chemotherapy with R-CHOP --Repeat PET/CT after completion of 6 cycles of therapy.  --return to clinic in 3 weeks: Labs, clinic visit, possible 4th cycle of systemic chemotherapy  Voice recognition software was used and creation of this note. Despite my best effort at editing the text, some misspelling/errors may have occurred.  Orders Placed This Encounter  Procedures  . CBC with Differential    Standing Status:   Future    Standing Expiration Date:   09/11/2018  . Comprehensive metabolic panel    Standing Status:   Future    Standing Expiration Date:   09/11/2018  . Magnesium    Standing Status:   Future    Standing Expiration Date:   09/11/2018  . Phosphorus    Standing Status:   Future    Standing Expiration Date:   09/11/2018    Cancer Staging Diffuse large B-cell lymphoma of lymph nodes of neck (HCC) Staging  form: Hodgkin and Non-Hodgkin Lymphoma, AJCC 8th Edition - Clinical stage from 07/13/2017: Stage II bulky (Diffuse large B-cell lymphoma) - Signed by Ardath Sax, MD on 07/20/2017   All questions were answered.  . The patient knows to call the clinic with any problems, questions or concerns.  This note was electronically signed.    History of Presenting Illness Todd Bell is a  71 y.o. Todd Bell male follwed in the [place] for diagnosis of diffuse large B-cell lymphoma.  Please see oncologic history below for details of the diagnosis. Patient has initially discovered asymmetry in his neck with visible mass earlier in July. He was seen by his primary care provider and subsequently directly to be evaluated by ENT. Patient underwent initially ultrasound of the neck demonstrating a large mass, subsequently he underwent CT of the neck on 06/15/17 demonstrating normal laryngeal, pharyngeal, salivary structures and a 5.0 cm supraclavicular mass on the right side. CT of the chest obtained the same time demonstrated mass, but no lymphadenopathy in the mediastinum, hilar structures, and a 0.4 cm right upper lobe nodule and a 0.5 cm left upper lobe nodule. Patient underwent an FNA on 06/17/17 which demonstrated presence of malignant cells that could not be further characterized due to insufficient sample. Initially, patient denied any significant symptoms in association with this mass. He denied any fevers, chills, night sweats. No significant weight loss. No changes to his activities of daily living or activity tolerance. Denied changes to his appetite. Denied chest pain, shortness of breath or cough. Mr Ericksen had no  nausea, early satiety, abdominal pain, diarrhea, or constipation. No dysuria or hematuria. Denied any cutaneous changes. Denied any vision changes, hearing changes, swallowing difficulties, any focal weakness or sensory deficits in the face or extremities.  Patient is  currently undergoing curative-intent chemotherapy with R-CHOP. He has received 2 cycles of chemotherapy so far, with excellent tolerance and subjective excellent response to treatment with resolution off palpable mass in the neck. PET CT obtained prior to this visit demonstrates partial response with residual persistence of FDG uptake in the largest of the masses. No new foci of disease. Patient reports no interval fevers, chills, night sweats. No uncontrolled nausea. No diarrhea. No chest pain, shortness of breath, cough, dysuria, or hematuria. No new urological symptoms. No swelling in the lower extremities.   Oncological/hematological History:   Diffuse large B-cell lymphoma of lymph nodes of neck ([place])   06/15/2017 Imaging    CT Neck/Chest: Normal laryngeal, pharyngeal, salivary structures and a 5.0 cm supraclavicular mass on the right side.No lymphadenopathy in the mediastinum, hilar structures, and a 0.4 cm right upper lobe nodule and a 0.5 cm left upper lobe nodule.       06/30/2017 Imaging    PET-CT: Highly hypermetabolic right conglomerate supraclavicular mass. This is accompanied by accentuated metabolic activity in the palatine tonsillar pillars, a hypermetabolic right buccinator lymph node, hypermetabolic thoracic lymph nodes including a mildly enlarged AP window lymph node and other upper normal size but mildly hypermetabolic hilar and infrahilar nodes. Appearance compatible with malignancy such as lymphoma or adenocarcinoma      07/13/2017 Pathology Results    Rt Neck mass core bx: Sheets of large lymphocytes discernible lymph node structure, consistent with diffuse large B-cell lymphoma. IHC -- positive for LCA, CD20, CD79a, PAX-5, BCL-6 & negative for CD10, CD34, BCL-2, CD30, CD138, kappa or lambda light chains      07/17/2017 Initial Diagnosis    Diffuse large B-cell lymphoma of lymph nodes of neck ([place])     07/29/2017 Procedure    Placement of left IJ power-injectable port catheter.        07/31/2017 -  Chemotherapy    R-CHOP21: Rituximab 36m/m2, d1 + Cyclophosphamide 7512mm2, d1 + Doxorubicin 5039m2, d1 + Vincristine 2mg66m1 + Prednisone 60mg41mQDay, d1-5 Q21 d x6 cycles planned --Cycle #1, 07/31/17: --Cycle #2, 08/21/17: --Cycle #3, 09/11/17:        09/03/2017 Imaging    PET-CT: The index nodal mass in the right supraclavicular region measures 3.5 cm and has an SUV max equal to 6.33 (Deauville Criteria 4). On the previous exam this measured 7.7 cm and had an SUV max equal to 29. The right buccinator node measures 6 mm and has an SUV max equal to 3.13 (Deauville Criteria 3). Previously 7 mm with an SUV max equal to 10.1. Mild increased FDG uptake associated with mediastinal and bilateral hilar regions. The index AP window lymph node measures 11 mm and has an SUV max equal to 3.93. (Deauville Criteria 4). Previously this measured 1.4 cm and had an SUV max of 4.9.       Medical History: Past Medical History:  Diagnosis Date  . Medical history non-contributory     Surgical History: Past Surgical History:  Procedure Laterality Date  . CATARACT EXTRACTION    . COLONOSCOPY  02/2016  . IR FLUORO GUIDE PORT INSERTION LEFT  07/29/2017  . IR US [place] VASC ACCESS LEFT  07/29/2017  . LYMPH NODE BIOPSY Right 07/13/2017   Procedure: OPEN NECK LYMPH  NODE BIOPSY RIGHT SIDE;  Surgeon: Izora Gala, MD;  Location: [facility];  Service: ENT;  Laterality: Right;    Family History: Family History  Problem Relation Age of Onset  . Cancer Father        Pancreatic    Social History: Social History   Social History  . Marital status: Married    Spouse name: N/A  . Number of children: N/A  . Years of education: N/A   Occupational History  . Not on file.   Social History Main Topics  . Smoking status: Former Smoker    Quit date: 1988  . Smokeless tobacco: Never Used     Comment: he was a social drinker  . Alcohol use Yes     Comment: occasional   . Drug use: No  . Sexual activity: Not on file   Other Topics Concern  . Not on file   Social History Narrative  . No narrative on file    Allergies: No Known Allergies  Medications:  Current Outpatient Prescriptions  Medication Sig Dispense Refill  . allopurinol (ZYLOPRIM) 100 MG tablet Take 1 tablet (100 mg total) by mouth 2 (two) times daily. 30 tablet 0  . aspirin EC 81 MG tablet Take 81 mg by mouth daily.    . cephALEXin (KEFLEX) 500 MG capsule Take 1 capsule (500 mg total) by mouth 3 (three) times daily. 15 capsule 0  . lidocaine-prilocaine (EMLA) cream Apply 1 application topically as needed. 30 g 6  . ondansetron (ZOFRAN) 8 MG tablet Take 1 tablet (8 mg total) by mouth 2 (two) times daily as needed for refractory nausea / vomiting. Start on day 3 after cyclophosphamide. (Patient not taking: Reported on 09/11/2017) 30 tablet 1  . prochlorperazine (COMPAZINE) 10 MG tablet Take 1 tablet (10 mg total) by mouth every 6 (six) hours as needed (Nausea or vomiting). (Patient not taking: Reported on 09/11/2017) 30 tablet 6   No current facility-administered medications for this visit.     Review of Systems: Review of Systems  All other systems reviewed and are negative.    PHYSICAL EXAMINATION There were no vitals taken for this visit.  ECOG PERFORMANCE STATUS: 1 - Symptomatic but completely ambulatory  Physical Exam  Constitutional: He is oriented to person, place, and time and well-developed, well-nourished, and in no distress. Vital signs are normal. He appears not malnourished, not dehydrated and not jaundiced. He does not have a sickly appearance. No distress.  HENT:  Mouth/Throat: Uvula is midline, oropharynx is clear and moist and mucous membranes are normal. Mucous membranes are not pale, not dry and not cyanotic. He has dentures. Normal dentition.  Eyes: Pupils are equal, round, and reactive to light. Conjunctivae and EOM are normal. No scleral icterus.  Neck: No JVD  present. Carotid bruit is not present.  Cardiovascular: Normal rate, regular rhythm and normal heart sounds.   No murmur heard. Pulmonary/Chest: Effort normal and breath sounds normal. He has no decreased breath sounds. He has no wheezes. He has no rhonchi. He has no rales.  Abdominal: Soft. Normal appearance and normal aorta. There is no hepatosplenomegaly. There is no tenderness.  Lymphadenopathy:       Head (right side): No submental, no submandibular, no tonsillar and no occipital adenopathy present.       Head (left side): No submental, no submandibular, no tonsillar and no occipital adenopathy present.    He has no cervical adenopathy.       Right cervical:  No posterior cervical adenopathy present.      Left cervical: No superficial cervical, no deep cervical and no posterior cervical adenopathy present.    He has no axillary adenopathy.       Right: No inguinal and no supraclavicular adenopathy present.       Left: No inguinal and no supraclavicular adenopathy present.  Complete resolution of the previously palpable bulky mass in the right lower neck/supraclavicular fossa.  Neurological: He is alert and oriented to person, place, and time. He has normal sensation, normal strength, normal reflexes and intact cranial nerves.  Skin: He is not diaphoretic.     LABORATORY DATA: I have personally reviewed the data as listed: Appointment on 09/11/2017  Component Date Value Ref Range Status  . WBC 09/11/2017 4.1  4.0 - 10.3 10e3/uL Final  . NEUT# 09/11/2017 1.8  1.5 - 6.5 10e3/uL Final  . HGB 09/11/2017 12.7* 13.0 - 17.1 g/dL Final  . HCT 09/11/2017 37.8* 38.4 - 49.9 % Final  . Platelets 09/11/2017 281  140 - 400 10e3/uL Final  . MCV 09/11/2017 94.7  79.3 - 98.0 fL Final  . MCH 09/11/2017 31.7  27.2 - 33.4 pg Final  . MCHC 09/11/2017 33.5  32.0 - 36.0 g/dL Final  . RBC 09/11/2017 3.99* 4.20 - 5.82 10e6/uL Final  . RDW 09/11/2017 14.6  11.0 - 14.6 % Final  . lymph# 09/11/2017 0.9  0.9  - 3.3 10e3/uL Final  . MONO# 09/11/2017 1.2* 0.1 - 0.9 10e3/uL Final  . Eosinophils Absolute 09/11/2017 0.1  0.0 - 0.5 10e3/uL Final  . Basophils Absolute 09/11/2017 0.1  0.0 - 0.1 10e3/uL Final  . NEUT% 09/11/2017 44.6  39.0 - 75.0 % Final  . LYMPH% 09/11/2017 21.4  14.0 - 49.0 % Final  . MONO% 09/11/2017 29.3* 0.0 - 14.0 % Final  . EOS% 09/11/2017 3.2  0.0 - 7.0 % Final  . BASO% 09/11/2017 1.5  0.0 - 2.0 % Final  . Sodium 09/11/2017 142  136 - 145 mEq/L Final  . Potassium 09/11/2017 4.4  3.5 - 5.1 mEq/L Final  . Chloride 09/11/2017 108  98 - 109 mEq/L Final  . CO2 09/11/2017 26  22 - 29 mEq/L Final  . Glucose 09/11/2017 100  70 - 140 mg/dl Final   Glucose reference range is for nonfasting patients. Fasting glucose reference range is 70- 100.  Marland Kitchen BUN 09/11/2017 14.5  7.0 - 26.0 mg/dL Final  . Creatinine 09/11/2017 0.9  0.7 - 1.3 mg/dL Final  . Total Bilirubin 09/11/2017 0.28  0.20 - 1.20 mg/dL Final  . Alkaline Phosphatase 09/11/2017 61  40 - 150 U/L Final  . AST 09/11/2017 17  5 - 34 U/L Final  . ALT 09/11/2017 14  0 - 55 U/L Final  . Total Protein 09/11/2017 6.6  6.4 - 8.3 g/dL Final  . Albumin 09/11/2017 3.6  3.5 - 5.0 g/dL Final  . Calcium 09/11/2017 9.2  8.4 - 10.4 mg/dL Final  . Anion Gap 09/11/2017 7  3 - 11 mEq/L Final  . EGFR 09/11/2017 81* >90 ml/min/1.73 m2 Final   eGFR is calculated using the CKD-EPI Creatinine Equation (2009)  . LDH 09/11/2017 187  125 - 245 U/L Final  . Uric Acid, Serum 09/11/2017 6.3  2.6 - 7.4 mg/dl Final  . Magnesium 09/11/2017 2.3  1.5 - 2.5 mg/dl Final       Ardath Sax, MD

## 2017-09-15 NOTE — Assessment & Plan Note (Signed)
71 y.o. male with diagnosis of activated B-cell diffuse large B-cell lymphoma of the lymph nodes of the right side of the neck. Currently available staging information suggests presence of stage II bulky disease. Based on the diagnosis, patient was started on R-CHOP chemotherapy and has completed 2 cycles of therapy without significant complications sofar. There has been significant reduction in the bulk of the lymphoma by examination. Interval PET/CT demonstrates strong partial response, although we still see an area of increased FDG uptake in the main disease focus.  Clinical evaluation and lab work today permissive to proceed with the third cycle of therapy. Patient will be planned for 6 cycles of treatment, subsequently, I recommend patient to undergo possible adjuvant involved field radiation therapy due to initial bulky disease in the neck as well as only partial response to the initiation of therapy.  Plan: --Proceed with the third cycle of systemic chemotherapy with R-CHOP --Repeat PET/CT after completion of 6 cycles of therapy.

## 2017-09-25 DIAGNOSIS — Z23 Encounter for immunization: Secondary | ICD-10-CM | POA: Diagnosis not present

## 2017-10-02 ENCOUNTER — Encounter: Payer: Self-pay | Admitting: Hematology and Oncology

## 2017-10-02 ENCOUNTER — Other Ambulatory Visit (HOSPITAL_BASED_OUTPATIENT_CLINIC_OR_DEPARTMENT_OTHER): Payer: Medicare Other

## 2017-10-02 ENCOUNTER — Ambulatory Visit (HOSPITAL_BASED_OUTPATIENT_CLINIC_OR_DEPARTMENT_OTHER): Payer: Medicare Other

## 2017-10-02 ENCOUNTER — Ambulatory Visit (HOSPITAL_BASED_OUTPATIENT_CLINIC_OR_DEPARTMENT_OTHER): Payer: Medicare Other | Admitting: Hematology and Oncology

## 2017-10-02 VITALS — BP 127/82 | HR 64 | Temp 97.7°F | Resp 17 | Ht 67.0 in | Wt 167.0 lb

## 2017-10-02 VITALS — BP 111/74 | HR 71 | Temp 98.0°F | Resp 16

## 2017-10-02 DIAGNOSIS — Z452 Encounter for adjustment and management of vascular access device: Secondary | ICD-10-CM | POA: Diagnosis present

## 2017-10-02 DIAGNOSIS — Z5111 Encounter for antineoplastic chemotherapy: Secondary | ICD-10-CM

## 2017-10-02 DIAGNOSIS — Z5112 Encounter for antineoplastic immunotherapy: Secondary | ICD-10-CM

## 2017-10-02 DIAGNOSIS — C8331 Diffuse large B-cell lymphoma, lymph nodes of head, face, and neck: Secondary | ICD-10-CM | POA: Diagnosis not present

## 2017-10-02 LAB — CBC WITH DIFFERENTIAL/PLATELET
BASO%: 0.8 % (ref 0.0–2.0)
Basophils Absolute: 0.1 10*3/uL (ref 0.0–0.1)
EOS ABS: 0.3 10*3/uL (ref 0.0–0.5)
EOS%: 5.3 % (ref 0.0–7.0)
HCT: 38.5 % (ref 38.4–49.9)
HEMOGLOBIN: 12.6 g/dL — AB (ref 13.0–17.1)
LYMPH#: 1.9 10*3/uL (ref 0.9–3.3)
LYMPH%: 31.7 % (ref 14.0–49.0)
MCH: 31.9 pg (ref 27.2–33.4)
MCHC: 32.7 g/dL (ref 32.0–36.0)
MCV: 97.5 fL (ref 79.3–98.0)
MONO#: 0.6 10*3/uL (ref 0.1–0.9)
MONO%: 10.2 % (ref 0.0–14.0)
NEUT%: 52 % (ref 39.0–75.0)
NEUTROS ABS: 3.2 10*3/uL (ref 1.5–6.5)
PLATELETS: 266 10*3/uL (ref 140–400)
RBC: 3.95 10*6/uL — ABNORMAL LOW (ref 4.20–5.82)
RDW: 14.8 % — AB (ref 11.0–14.6)
WBC: 6.1 10*3/uL (ref 4.0–10.3)

## 2017-10-02 LAB — COMPREHENSIVE METABOLIC PANEL
ALBUMIN: 3.7 g/dL (ref 3.5–5.0)
ALK PHOS: 65 U/L (ref 40–150)
ALT: 12 U/L (ref 0–55)
AST: 17 U/L (ref 5–34)
Anion Gap: 8 mEq/L (ref 3–11)
BILIRUBIN TOTAL: 0.36 mg/dL (ref 0.20–1.20)
BUN: 16.5 mg/dL (ref 7.0–26.0)
CO2: 26 mEq/L (ref 22–29)
Calcium: 9.1 mg/dL (ref 8.4–10.4)
Chloride: 108 mEq/L (ref 98–109)
Creatinine: 0.9 mg/dL (ref 0.7–1.3)
GLUCOSE: 108 mg/dL (ref 70–140)
Potassium: 4.6 mEq/L (ref 3.5–5.1)
Sodium: 142 mEq/L (ref 136–145)
TOTAL PROTEIN: 6.9 g/dL (ref 6.4–8.3)

## 2017-10-02 LAB — MAGNESIUM: Magnesium: 2.3 mg/dl (ref 1.5–2.5)

## 2017-10-02 MED ORDER — HEPARIN SOD (PORK) LOCK FLUSH 100 UNIT/ML IV SOLN
500.0000 [IU] | Freq: Once | INTRAVENOUS | Status: AC | PRN
  Administered 2017-10-02: 500 [IU]
  Filled 2017-10-02: qty 5

## 2017-10-02 MED ORDER — VINCRISTINE SULFATE CHEMO INJECTION 1 MG/ML
2.0000 mg | Freq: Once | INTRAVENOUS | Status: AC
  Administered 2017-10-02: 2 mg via INTRAVENOUS
  Filled 2017-10-02: qty 2

## 2017-10-02 MED ORDER — FAMOTIDINE IN NACL 20-0.9 MG/50ML-% IV SOLN
INTRAVENOUS | Status: AC
  Filled 2017-10-02: qty 50

## 2017-10-02 MED ORDER — DEXAMETHASONE SODIUM PHOSPHATE 10 MG/ML IJ SOLN
10.0000 mg | Freq: Once | INTRAMUSCULAR | Status: AC
  Administered 2017-10-02: 10 mg via INTRAVENOUS

## 2017-10-02 MED ORDER — SODIUM CHLORIDE 0.9% FLUSH
10.0000 mL | INTRAVENOUS | Status: DC | PRN
  Administered 2017-10-02: 10 mL
  Filled 2017-10-02: qty 10

## 2017-10-02 MED ORDER — SODIUM CHLORIDE 0.9 % IV SOLN
Freq: Once | INTRAVENOUS | Status: AC
  Administered 2017-10-02: 09:00:00 via INTRAVENOUS

## 2017-10-02 MED ORDER — FAMOTIDINE IN NACL 20-0.9 MG/50ML-% IV SOLN
20.0000 mg | Freq: Once | INTRAVENOUS | Status: AC
  Administered 2017-10-02: 20 mg via INTRAVENOUS

## 2017-10-02 MED ORDER — PALONOSETRON HCL INJECTION 0.25 MG/5ML
0.2500 mg | Freq: Once | INTRAVENOUS | Status: AC
  Administered 2017-10-02: 0.25 mg via INTRAVENOUS

## 2017-10-02 MED ORDER — ACETAMINOPHEN 325 MG PO TABS
ORAL_TABLET | ORAL | Status: AC
  Filled 2017-10-02: qty 2

## 2017-10-02 MED ORDER — DIPHENHYDRAMINE HCL 25 MG PO CAPS
25.0000 mg | ORAL_CAPSULE | Freq: Once | ORAL | Status: AC
  Administered 2017-10-02: 25 mg via ORAL

## 2017-10-02 MED ORDER — DOXORUBICIN HCL CHEMO IV INJECTION 2 MG/ML
50.0000 mg/m2 | Freq: Once | INTRAVENOUS | Status: AC
  Administered 2017-10-02: 96 mg via INTRAVENOUS
  Filled 2017-10-02: qty 48

## 2017-10-02 MED ORDER — PALONOSETRON HCL INJECTION 0.25 MG/5ML
INTRAVENOUS | Status: AC
  Filled 2017-10-02: qty 5

## 2017-10-02 MED ORDER — SODIUM CHLORIDE 0.9 % IV SOLN
375.0000 mg/m2 | Freq: Once | INTRAVENOUS | Status: AC
  Administered 2017-10-02: 700 mg via INTRAVENOUS
  Filled 2017-10-02: qty 50

## 2017-10-02 MED ORDER — DEXAMETHASONE SODIUM PHOSPHATE 10 MG/ML IJ SOLN
INTRAMUSCULAR | Status: AC
  Filled 2017-10-02: qty 1

## 2017-10-02 MED ORDER — ACETAMINOPHEN 325 MG PO TABS
650.0000 mg | ORAL_TABLET | Freq: Once | ORAL | Status: AC
  Administered 2017-10-02: 650 mg via ORAL

## 2017-10-02 MED ORDER — CYCLOPHOSPHAMIDE CHEMO INJECTION 1 GM
750.0000 mg/m2 | Freq: Once | INTRAMUSCULAR | Status: AC
  Administered 2017-10-02: 1440 mg via INTRAVENOUS
  Filled 2017-10-02: qty 72

## 2017-10-02 MED ORDER — ALTEPLASE 2 MG IJ SOLR
2.0000 mg | Freq: Once | INTRAMUSCULAR | Status: AC | PRN
  Administered 2017-10-02: 2 mg
  Filled 2017-10-02: qty 2

## 2017-10-02 MED ORDER — DIPHENHYDRAMINE HCL 25 MG PO CAPS
ORAL_CAPSULE | ORAL | Status: AC
  Filled 2017-10-02: qty 1

## 2017-10-02 NOTE — Patient Instructions (Signed)
[facility] Discharge Instructions for Patients Receiving Chemotherapy  Today you received the following chemotherapy agents:  Adriamycin, Vincristine, Cytoxan, and Rituxan.  To help prevent nausea and vomiting after your treatment, we encourage you to take your nausea medication as directed.   If you develop nausea and vomiting that is not controlled by your nausea medication, call the clinic.   BELOW ARE SYMPTOMS THAT SHOULD BE REPORTED IMMEDIATELY:  *FEVER GREATER THAN 100.5 F  *CHILLS WITH OR WITHOUT FEVER  NAUSEA AND VOMITING THAT IS NOT CONTROLLED WITH YOUR NAUSEA MEDICATION  *UNUSUAL SHORTNESS OF BREATH  *UNUSUAL BRUISING OR BLEEDING  TENDERNESS IN MOUTH AND THROAT WITH OR WITHOUT PRESENCE OF ULCERS  *URINARY PROBLEMS  *BOWEL PROBLEMS  UNUSUAL RASH Items with * indicate a potential emergency and should be followed up as soon as possible.  Feel free to call the clinic should you have any questions or concerns. The clinic phone number is [phone].  Please show the CHEMO ALERT CARD at check-in to the Emergency Department and triage nurse.   

## 2017-10-03 LAB — PHOSPHORUS: PHOSPHORUS: 4 mg/dL (ref 2.5–4.5)

## 2017-10-05 ENCOUNTER — Encounter (HOSPITAL_COMMUNITY): Payer: Self-pay | Admitting: Emergency Medicine

## 2017-10-05 ENCOUNTER — Emergency Department (HOSPITAL_COMMUNITY): Payer: Medicare Other

## 2017-10-05 ENCOUNTER — Telehealth: Payer: Self-pay

## 2017-10-05 ENCOUNTER — Observation Stay (HOSPITAL_COMMUNITY)
Admission: EM | Admit: 2017-10-05 | Discharge: 2017-10-06 | Disposition: A | Discharge status: Home / Self Care | Payer: Medicare Other | Attending: Internal Medicine | Admitting: Internal Medicine

## 2017-10-05 ENCOUNTER — Other Ambulatory Visit: Payer: Self-pay

## 2017-10-05 DIAGNOSIS — Z9849 Cataract extraction status, unspecified eye: Secondary | ICD-10-CM | POA: Insufficient documentation

## 2017-10-05 DIAGNOSIS — Z9221 Personal history of antineoplastic chemotherapy: Secondary | ICD-10-CM | POA: Diagnosis not present

## 2017-10-05 DIAGNOSIS — R5383 Other fatigue: Secondary | ICD-10-CM | POA: Diagnosis not present

## 2017-10-05 DIAGNOSIS — A419 Sepsis, unspecified organism: Secondary | ICD-10-CM

## 2017-10-05 DIAGNOSIS — R509 Fever, unspecified: Secondary | ICD-10-CM | POA: Diagnosis not present

## 2017-10-05 DIAGNOSIS — Z87891 Personal history of nicotine dependence: Secondary | ICD-10-CM | POA: Diagnosis not present

## 2017-10-05 DIAGNOSIS — Z79899 Other long term (current) drug therapy: Secondary | ICD-10-CM | POA: Diagnosis not present

## 2017-10-05 DIAGNOSIS — Z800 Family history of malignant neoplasm of digestive organs: Secondary | ICD-10-CM | POA: Insufficient documentation

## 2017-10-05 DIAGNOSIS — L0390 Cellulitis, unspecified: Secondary | ICD-10-CM | POA: Diagnosis present

## 2017-10-05 DIAGNOSIS — J3489 Other specified disorders of nose and nasal sinuses: Secondary | ICD-10-CM | POA: Insufficient documentation

## 2017-10-05 DIAGNOSIS — R05 Cough: Secondary | ICD-10-CM | POA: Insufficient documentation

## 2017-10-05 DIAGNOSIS — R220 Localized swelling, mass and lump, head: Secondary | ICD-10-CM | POA: Diagnosis not present

## 2017-10-05 DIAGNOSIS — C8330 Diffuse large B-cell lymphoma, unspecified site: Secondary | ICD-10-CM | POA: Diagnosis not present

## 2017-10-05 DIAGNOSIS — L03211 Cellulitis of face: Principal | ICD-10-CM | POA: Insufficient documentation

## 2017-10-05 DIAGNOSIS — J9811 Atelectasis: Secondary | ICD-10-CM | POA: Diagnosis not present

## 2017-10-05 HISTORY — DX: Malignant (primary) neoplasm, unspecified: C80.1

## 2017-10-05 HISTORY — DX: Non-Hodgkin lymphoma, unspecified, unspecified site: C85.90

## 2017-10-05 LAB — COMPREHENSIVE METABOLIC PANEL
ALK PHOS: 62 U/L (ref 38–126)
ALT: 16 U/L — ABNORMAL LOW (ref 17–63)
ANION GAP: 11 (ref 5–15)
AST: 19 U/L (ref 15–41)
Albumin: 3.5 g/dL (ref 3.5–5.0)
BUN: 13 mg/dL (ref 6–20)
CALCIUM: 8.5 mg/dL — AB (ref 8.9–10.3)
CO2: 24 mmol/L (ref 22–32)
Chloride: 102 mmol/L (ref 101–111)
Creatinine, Ser: 0.72 mg/dL (ref 0.61–1.24)
GFR calc non Af Amer: >60 mL/min (ref >60)
Glucose, Bld: 110 mg/dL — ABNORMAL HIGH (ref 65–99)
POTASSIUM: 3.5 mmol/L (ref 3.5–5.1)
SODIUM: 137 mmol/L (ref 135–145)
Total Bilirubin: 0.9 mg/dL (ref 0.3–1.2)
Total Protein: 6.4 g/dL — ABNORMAL LOW (ref 6.5–8.1)

## 2017-10-05 LAB — CBC WITH DIFFERENTIAL/PLATELET
BASOS ABS: 0 10*3/uL (ref 0.0–0.1)
BASOS PCT: 0 %
Eosinophils Absolute: 0.2 10*3/uL (ref 0.0–0.7)
Eosinophils Relative: 2 %
HEMATOCRIT: 35.4 % — AB (ref 39.0–52.0)
HEMOGLOBIN: 11.7 g/dL — AB (ref 13.0–17.0)
LYMPHS PCT: 9 %
Lymphs Abs: 0.7 10*3/uL (ref 0.7–4.0)
MCH: 31.3 pg (ref 26.0–34.0)
MCHC: 33.1 g/dL (ref 30.0–36.0)
MCV: 94.7 fL (ref 78.0–100.0)
MONOS PCT: 10 %
Monocytes Absolute: 0.8 10*3/uL (ref 0.1–1.0)
NEUTROS ABS: 6.2 10*3/uL (ref 1.7–7.7)
NEUTROS PCT: 79 %
Platelets: 257 10*3/uL (ref 150–400)
RBC: 3.74 MIL/uL — ABNORMAL LOW (ref 4.22–5.81)
RDW: 14.5 % (ref 11.5–15.5)
WBC: 7.9 10*3/uL (ref 4.0–10.5)

## 2017-10-05 LAB — URINALYSIS, ROUTINE W REFLEX MICROSCOPIC
Bilirubin Urine: NEGATIVE
Glucose, UA: NEGATIVE mg/dL
Hgb urine dipstick: NEGATIVE
Ketones, ur: NEGATIVE mg/dL
LEUKOCYTES UA: NEGATIVE
NITRITE: NEGATIVE
Protein, ur: NEGATIVE mg/dL
SPECIFIC GRAVITY, URINE: 1.005 (ref 1.005–1.030)
pH: 6 (ref 5.0–8.0)

## 2017-10-05 LAB — I-STAT CG4 LACTIC ACID, ED: LACTIC ACID, VENOUS: 1.14 mmol/L (ref 0.5–1.9)

## 2017-10-05 MED ORDER — VANCOMYCIN HCL IN DEXTROSE 1-5 GM/200ML-% IV SOLN
1000.0000 mg | Freq: Once | INTRAVENOUS | Status: AC
  Administered 2017-10-05: 1000 mg via INTRAVENOUS
  Filled 2017-10-05: qty 200

## 2017-10-05 MED ORDER — ONDANSETRON HCL 4 MG/2ML IJ SOLN: 4.0000 mg | Freq: Four times a day (QID) | INTRAMUSCULAR | Status: DC | PRN

## 2017-10-05 MED ORDER — PROCHLORPERAZINE MALEATE 10 MG PO TABS: 10.0000 mg | ORAL_TABLET | Freq: Four times a day (QID) | ORAL | Status: DC | PRN

## 2017-10-05 MED ORDER — SODIUM CHLORIDE 0.9 % IV SOLN
INTRAVENOUS | Status: AC
  Administered 2017-10-05 – 2017-10-06 (×2): via INTRAVENOUS

## 2017-10-05 MED ORDER — ONDANSETRON HCL 4 MG PO TABS: 4.0000 mg | ORAL_TABLET | Freq: Four times a day (QID) | ORAL | Status: DC | PRN

## 2017-10-05 MED ORDER — SODIUM CHLORIDE 0.9 % IV BOLUS (SEPSIS)
1000.0000 mL | Freq: Once | INTRAVENOUS | Status: AC
  Administered 2017-10-05: 1000 mL via INTRAVENOUS

## 2017-10-05 MED ORDER — ACETAMINOPHEN 325 MG PO TABS
650.0000 mg | ORAL_TABLET | Freq: Once | ORAL | Status: AC
  Administered 2017-10-05: 650 mg via ORAL
  Filled 2017-10-05: qty 2

## 2017-10-05 MED ORDER — ACETAMINOPHEN 650 MG RE SUPP: 650.0000 mg | Freq: Four times a day (QID) | RECTAL | Status: DC | PRN

## 2017-10-05 MED ORDER — IOPAMIDOL (ISOVUE-300) INJECTION 61%
75.0000 mL | Freq: Once | INTRAVENOUS | Status: AC | PRN
  Administered 2017-10-05: 75 mL via INTRAVENOUS

## 2017-10-05 MED ORDER — VANCOMYCIN HCL IN DEXTROSE 750-5 MG/150ML-% IV SOLN
750.0000 mg | Freq: Two times a day (BID) | INTRAVENOUS | Status: DC
  Administered 2017-10-05 – 2017-10-06 (×2): 750 mg via INTRAVENOUS
  Filled 2017-10-05 (×3): qty 150

## 2017-10-05 MED ORDER — IOPAMIDOL (ISOVUE-300) INJECTION 61%
INTRAVENOUS | Status: AC
  Filled 2017-10-05: qty 75

## 2017-10-05 MED ORDER — HYDRALAZINE HCL 20 MG/ML IJ SOLN: 10.0000 mg | Freq: Three times a day (TID) | INTRAMUSCULAR | Status: DC | PRN

## 2017-10-05 MED ORDER — ACETAMINOPHEN 325 MG PO TABS: 650.0000 mg | ORAL_TABLET | Freq: Four times a day (QID) | ORAL | Status: DC | PRN

## 2017-10-05 MED ORDER — DEXTROSE 5 % IV SOLN
2.0000 g | Freq: Once | INTRAVENOUS | Status: AC
  Administered 2017-10-05: 2 g via INTRAVENOUS
  Filled 2017-10-05: qty 2

## 2017-10-05 NOTE — ED Notes (Signed)
Bed: WA16 Expected date:  Expected time:  Means of arrival:  Comments: Triage 2 

## 2017-10-05 NOTE — Progress Notes (Signed)
A consult was received from an ED physician for vanc/cefepime per pharmacy dosing.  The patient's profile has been reviewed for ht/wt/allergies/indication/available labs.   A one time order has been placed for 1g vanc and 2g cefepime.  Further antibiotics/pharmacy consults should be ordered by admitting physician if indicated.                       Thank you, Kara Mead 10/05/2017  12:30 PM

## 2017-10-05 NOTE — ED Provider Notes (Signed)
[facility] DEPT Provider Note   CSN: [id number] Arrival date & time: 10/05/17  1055     History   Chief Complaint Chief Complaint  Patient presents with  . Fever  . Facial Swelling    R eye    HPI Todd Bell is a 71 y.o. male who presents with a fever and cough.  Past medical history significant for large B cell lymphoma.  Patient is currently undergoing chemotherapy and last session was 3 days ago.  He states that over the weekend he developed cough and congestion.  He had a fever at home of 100.2.  He has been taking over the counter cough and cold medicine with mild relief.  Family called his doctor's office today who recommended him to come to the ED.  He has associated periorbital erythema and mild swelling which developed overnight.  He denies any vision changes, eye pain or drainage.  He denies chills, headache, sore throat, chest pain, shortness of breath, abdominal pain, nausea, vomiting, diarrhea, urinary symptoms. Oncologist is Dr. Lebron Conners.   HPI  Past Medical History:  Diagnosis Date  . Cancer ([place])   . Medical history non-contributory   . Non Hodgkin's lymphoma [facility])     Patient Active Problem List   Diagnosis Date Noted  . Diffuse large B-cell lymphoma of lymph nodes of neck ([place]) 07/17/2017    Past Surgical History:  Procedure Laterality Date  . CATARACT EXTRACTION    . COLONOSCOPY  02/2016  . IR FLUORO GUIDE PORT INSERTION LEFT  07/29/2017  . IR US GUIDE VASC ACCESS LEFT  07/29/2017  . LYMPH NODE BIOPSY Right 07/13/2017   Procedure: OPEN NECK LYMPH NODE BIOPSY RIGHT SIDE;  Surgeon: Izora Gala, MD;  Location: [facility];  Service: ENT;  Laterality: Right;       Home Medications    Prior to Admission medications   Medication Sig Start Date End Date Taking? Authorizing Provider  Dextromethorphan-Guaifenesin (ROBITUSSIN SUGAR FREE) 10-100 MG/5ML liquid Take 20 mLs by mouth 2 (two) times daily as needed  (congestion and cough).   Yes [provider]  ibuprofen (ADVIL,MOTRIN) 200 MG tablet Take 400 mg by mouth daily as needed for fever or moderate pain.   Yes [provider]  lidocaine-prilocaine (EMLA) cream Apply 1 application topically as needed. 08/11/17  Yes Brunetta Genera, MD  ondansetron (ZOFRAN) 8 MG tablet Take 1 tablet (8 mg total) by mouth 2 (two) times daily as needed for refractory nausea / vomiting. Start on day 3 after cyclophosphamide. 07/31/17  Yes Brunetta Genera, MD  prochlorperazine (COMPAZINE) 10 MG tablet Take 1 tablet (10 mg total) by mouth every 6 (six) hours as needed (Nausea or vomiting). 07/31/17  Yes Ardath Sax, MD  allopurinol (ZYLOPRIM) 100 MG tablet Take 1 tablet (100 mg total) by mouth 2 (two) times daily. Patient not taking: Reported on 10/05/2017 07/31/17   Brunetta Genera, MD  cephALEXin (KEFLEX) 500 MG capsule Take 1 capsule (500 mg total) by mouth 3 (three) times daily. Patient not taking: Reported on 10/05/2017 07/13/17   Izora Gala, MD    Family History Family History  Problem Relation Age of Onset  . Cancer Father        Pancreatic    Social History Social History  Substance Use Topics  . Smoking status: Former Smoker    Quit date: 1988  . Smokeless tobacco: Never Used     Comment: he was a social drinker  .  Alcohol use Yes     Comment: occasional     Allergies   Patient has no known allergies.   Review of Systems Review of Systems  Constitutional: Positive for fever. Negative for appetite change and chills.  HENT: Positive for congestion and rhinorrhea. Negative for ear pain and sore throat.   Eyes: Negative for pain and visual disturbance.       + Periorbital erythema and swelling  Respiratory: Positive for cough. Negative for shortness of breath and wheezing.   Cardiovascular: Negative for chest pain.  Gastrointestinal: Negative for abdominal pain, diarrhea, nausea and vomiting.  Genitourinary:  Negative for dysuria.  Neurological: Negative for light-headedness.  All other systems reviewed and are negative.    Physical Exam Updated Vital Signs BP (!) 140/91 (BP Location: Right Arm)   Pulse (!) 101   Temp 100.3 F (37.9 C) (Rectal)   Resp 16   SpO2 97%   Physical Exam  Constitutional: He is oriented to person, place, and time. He appears well-developed and well-nourished. No distress.  HENT:  Head: Normocephalic and atraumatic.  Right Ear: Hearing, tympanic membrane, external ear and ear canal normal.  Left Ear: Hearing, tympanic membrane, external ear and ear canal normal.  Nose: Rhinorrhea present.  Mouth/Throat: Uvula is midline, oropharynx is clear and moist and mucous membranes are normal.  Eyes: Pupils are equal, round, and reactive to light. Right eye exhibits no discharge. Left eye exhibits no discharge. Right conjunctiva is injected. No scleral icterus.  Mild periorbital erythema and swelling.  Conjunctive is injected  Neck: Normal range of motion.  Cardiovascular: Regular rhythm.  Tachycardia present.  Exam reveals no gallop and no friction rub.   No murmur heard. Pulmonary/Chest: Effort normal and breath sounds normal. No respiratory distress. He has no wheezes. He has no rales. He exhibits no tenderness.  Port-a-cath  Abdominal: Soft. Bowel sounds are normal. He exhibits no distension. There is no tenderness.  Neurological: He is alert and oriented to person, place, and time.  Skin: Skin is warm and dry.  Psychiatric: He has a normal mood and affect. His behavior is normal.  Nursing note and vitals reviewed.    ED Treatments / Results  Labs (all labs ordered are listed, but only abnormal results are displayed) Labs Reviewed  COMPREHENSIVE METABOLIC PANEL - Abnormal; Notable for the following:       Result Value   Glucose, Bld 110 (*)    Calcium 8.5 (*)    Total Protein 6.4 (*)    ALT 16 (*)    All other components within normal limits  CBC WITH  DIFFERENTIAL/PLATELET - Abnormal; Notable for the following:    RBC 3.74 (*)    Hemoglobin 11.7 (*)    HCT 35.4 (*)    All other components within normal limits  URINALYSIS, ROUTINE W REFLEX MICROSCOPIC - Abnormal; Notable for the following:    Color, Urine STRAW (*)    All other components within normal limits  CULTURE, BLOOD (ROUTINE X 2)  CULTURE, BLOOD (ROUTINE X 2)  I-STAT CG4 LACTIC ACID, ED  I-STAT CG4 LACTIC ACID, ED    EKG  EKG Interpretation None       Radiology Dg Chest 2 View  Result Date: 10/05/2017 CLINICAL DATA:  Non-Hodgkin's lymphoma EXAM: CHEST  2 VIEW COMPARISON:  09/03/2017 PET-CT FINDINGS: The heart is borderline enlarged. Normal vascularity. Left jugular Port-A-Cath is in place with its tip at the cavoatrial junction. Linear atelectasis towards the lung bases. No evidence  of a enlarging lung mass or developing consolidation. No pneumothorax or pleural effusion. IMPRESSION: No active cardiopulmonary disease. Electronically Signed   By: Marybelle Killings M.D.   On: 10/05/2017 13:32   Ct Orbits W Contrast  Result Date: 10/05/2017 CLINICAL DATA:  Low-grade temperature following recent chemotherapy for non-Hodgkin's lymphoma. Swelling under the upright MRI. Initial encounter. EXAM: CT ORBITS WITH CONTRAST TECHNIQUE: Multidetector CT images was performed according to the standard protocol following intravenous contrast administration. CONTRAST:  75 mL Isovue-300 COMPARISON:  None. FINDINGS: Orbits: Bilateral lens replacements are present. There is slight elongation of the globes. No focal lesions are present. The optic nerves and rectus musculature is within normal limits. Orbital apex is normal bilaterally. Visualized sinuses: Asymmetric mucosal thickening is present throughout the left maxillary sinus and ethmoid air cells anteriorly. There is a fluid level in the left maxillary sinus. Soft tissue obscures the ostiomeatal complex. There is also soft tissue obstruction of  the right ostiomeatal complex. Aeration of the right ethmoid air cells is better than on the left. There is supraorbital extension of the frontal sinuses. There is lateral extension of the sphenoid sinuses bilaterally with onodi cells. Mild mucosal thickening is present in the maxillary sinuses bilaterally. Mastoid air cells are clear. Soft tissues: No significant soft tissue inflammation or collection is present. Limited intracranial: Within normal limits. IMPRESSION: 1. Asymmetric anterior left paranasal sinus disease with obstruction of the ostiomeatal complex bilaterally, left greater than right. 2. Supraorbital extension of frontal sinuses bilaterally. 3. No significant superficial or deep soft tissue lesion or abscess. Electronically Signed   By: San Morelle M.D.   On: 10/05/2017 14:00    Procedures Procedures (including critical care time)  Medications Ordered in ED Medications  vancomycin (VANCOCIN) IVPB 1000 mg/200 mL premix (1,000 mg Intravenous New Bag/Given 10/05/17 1417)  iopamidol (ISOVUE-300) 61 % injection (not administered)  sodium chloride 0.9 % bolus 1,000 mL (0 mLs Intravenous Stopped 10/05/17 1419)  acetaminophen (TYLENOL) tablet 650 mg (650 mg Oral Given 10/05/17 1239)  ceFEPIme (MAXIPIME) 2 g in dextrose 5 % 50 mL IVPB (0 g Intravenous Stopped 10/05/17 1310)  iopamidol (ISOVUE-300) 61 % injection 75 mL (75 mLs Intravenous Contrast Given 10/05/17 1331)     Initial Impression / Assessment and Plan / ED Course  I have reviewed the triage vital signs and the nursing notes.  Pertinent labs & imaging results that were available during my care of the patient were reviewed by me and considered in my medical decision making (see chart for details).  71 year old male presents with sepsis likely due to respiratory infection.  Rectal temp is 100.3 and he is mildly tachycardic.  Otherwise vital signs are normal.  Code sepsis was initiated.  CBC is remarkable for mild anemia  which is around his baseline.  CMP is unremarkable.  Lactic acid is normal.  UA is clean.  Chest x-ray is negative.  CT orbits show sinusitis left greater than right.  Fluids, Tylenol, empiric antibiotics were started.  She had visit with Dr. Darl Householder.  Will admit for ongoing care. Spoke to Dr. Aggie Moats who will admit.  Final Clinical Impressions(s) / ED Diagnoses   Final diagnoses:  Fever, unspecified fever cause  Sepsis, due to unspecified organism [facility])    New Prescriptions New Prescriptions   No medications on file     Iris Pert 10/05/17 1523    Drenda Freeze, MD 10/05/17 [phone]

## 2017-10-05 NOTE — Telephone Encounter (Signed)
Per Gayleen Orem, RN Oncology Navigator, patient's wife called and needs to speak to Dr. Clydene Laming nurse. Called patient's wife and she stated that patient had a temp last night of "102" and "103". Per wife, temp this am was 98.7. Per wife, patient also has congestion and a bad cough. Dr. Lebron Conners made aware. Dr. Lebron Conners stated that patient should have come to the ED last night with increased temp still needs to come to the ED today to be evaluated. Patient's wife verbalized understanding.

## 2017-10-05 NOTE — ED Notes (Signed)
Admitting physician at bedside

## 2017-10-05 NOTE — Progress Notes (Signed)
Pharmacy Antibiotic Note  Todd Bell is a 71 y.o. male admitted on 10/05/2017 with cellulitis.  Pharmacy has been consulted for vancomycin dosing. Patient receiving chemo for NHL.  Noted to have facial cellulitis, no periorbital involvement per CT  Today, 10/05/2017   Renal: SCr WNL  WBC WNL  Tm 100.3  Plan:  vanco 1gm x 1 in ED then 750mg  IV q12h (trough goal 10-42mcg/mL)  Check trough if remains on vancomycin > 4 days (plan is to discharge 10/30 per notes)  Monitor renal fx  Height: 5\' 7"  (170.2 cm) Weight: 169 lb 12.1 oz (77 kg) IBW/kg (Calculated) : 66.1  Temp (24hrs), Avg:99 F (37.2 C), Min:98.1 F (36.7 C), Max:100.3 F (37.9 C)   Recent Labs Lab 10/02/17 0746 10/02/17 0746 10/05/17 1210 10/05/17 1239  WBC 6.1  --  7.9  --   CREATININE  --  0.9 0.72  --   LATICACIDVEN  --   --   --  1.14    Estimated Creatinine Clearance: 76.9 mL/min (by C-G formula based on SCr of 0.72 mg/dL).    No Known Allergies  Antimicrobials this admission: 10/29 vanco >>  Dose adjustments this admission:  Microbiology results: 10/29 BCx:   Thank you for allowing pharmacy to be a part of this patient's care.  Doreene Eland, PharmD, BCPS.   Pager: [phone] 10/05/2017 6:34 PM

## 2017-10-05 NOTE — ED Triage Notes (Addendum)
Pt c/o fever x several days. Pt states he had chemotherapy on Friday and after returning home developed a fever. Highest 100.2, pt c/o non productive cough and states he has been taking Advil and Robitussin. Last elevated temp was Sunday pm. Pt denies pain. Pt states edema below R eye that began this morning. No drainage.

## 2017-10-05 NOTE — H&P (Signed)
[facility] History and Physical  Gaylan Fauver WUJ:[id number] DOB: [birth year] DOA: 10/05/2017  Referring physician:  PCP: Lujean Amel, MD   Chief Complaint: Fever  HPI: Todd Bell is a 71 y.o. male patient with past medical history significant for non-Hodgkin's lymphoma undergoing chemotherapy recently finishing his fourth round of therapy last week.  Presents to the hospital with chief complaint of fever fatigue.  Patient did tried antipyretic at home which was effective.  Contacted his oncologist who advised him to come to the emergency room for evaluation since he recently had chemo.  ED course: Patient has some facial erythema.  CT orbits negative for periorbital cellulitis.  Hospitalist consulted for admission.   Review of Systems:  As per HPI otherwise 10 point review of systems negative.    Past Medical History:  Diagnosis Date  . Cancer ([place])   . Medical history non-contributory   . Non Hodgkin's lymphoma [facility])    Past Surgical History:  Procedure Laterality Date  . CATARACT EXTRACTION    . COLONOSCOPY  02/2016  . IR FLUORO GUIDE PORT INSERTION LEFT  07/29/2017  . IR US GUIDE VASC ACCESS LEFT  07/29/2017  . LYMPH NODE BIOPSY Right 07/13/2017   Procedure: OPEN NECK LYMPH NODE BIOPSY RIGHT SIDE;  Surgeon: Izora Gala, MD;  Location: [facility];  Service: ENT;  Laterality: Right;   Social History:  reports that he quit smoking about 30 years ago. He has never used smokeless tobacco. He reports that he drinks alcohol. He reports that he does not use drugs.  No Known Allergies  Family History  Problem Relation Age of Onset  . Cancer Father        Pancreatic     Prior to Admission medications   Medication Sig Start Date End Date Taking? Authorizing Provider  Dextromethorphan-Guaifenesin (ROBITUSSIN SUGAR FREE) 10-100 MG/5ML liquid Take 20 mLs by mouth 2 (two) times daily as needed (congestion and cough).   Yes [provider]    ibuprofen (ADVIL,MOTRIN) 200 MG tablet Take 400 mg by mouth daily as needed for fever or moderate pain.   Yes [provider]  lidocaine-prilocaine (EMLA) cream Apply 1 application topically as needed. 08/11/17  Yes Brunetta Genera, MD  ondansetron (ZOFRAN) 8 MG tablet Take 1 tablet (8 mg total) by mouth 2 (two) times daily as needed for refractory nausea / vomiting. Start on day 3 after cyclophosphamide. 07/31/17  Yes Brunetta Genera, MD  prochlorperazine (COMPAZINE) 10 MG tablet Take 1 tablet (10 mg total) by mouth every 6 (six) hours as needed (Nausea or vomiting). 07/31/17  Yes Ardath Sax, MD  allopurinol (ZYLOPRIM) 100 MG tablet Take 1 tablet (100 mg total) by mouth 2 (two) times daily. Patient not taking: Reported on 10/05/2017 07/31/17   Brunetta Genera, MD  cephALEXin (KEFLEX) 500 MG capsule Take 1 capsule (500 mg total) by mouth 3 (three) times daily. Patient not taking: Reported on 10/05/2017 07/13/17   Izora Gala, MD   Physical Exam: Vitals:   10/05/17 1241 10/05/17 1417 10/05/17 1459 10/05/17 1600  BP: 119/82 124/83 116/71 (!) 142/94  Pulse: 91 84 93 92  Resp: 20 14 18 18   Temp:  98.8 F (37.1 C)    TempSrc:  Oral    SpO2: 100% 99% 98% 99%    Wt Readings from Last 3 Encounters:  10/02/17 75.8 kg (167 lb)  08/21/17 76.3 kg (168 lb 4.8 oz)  08/11/17 76.7 kg (169 lb 3.2 oz)  General:  Appears calm and comfortable; A&Ox3, hard of hearing Eyes:  PERRL, EOMI, normal lids, iris, signs of conjunctivitis- injected, whitish green discharge ENT:  grossly normal hearing, lips & tongue, nasal congestion with white greenish mucus Neck:  no LAD, masses or thyromegaly Cardiovascular:  RRR, no m/r/g. No LE edema.  Respiratory:  CTA bilaterally, no w/r/r. Normal respiratory effort. Abdomen:  soft, ntnd Skin:  no rash seen on limited exam; induration and erythema around the eye Musculoskeletal:  grossly normal tone BUE/BLE Psychiatric:  grossly normal mood  and affect, speech fluent and appropriate Neurologic:  CN 2-12 grossly intact, moves all extremities in coordinated fashion.            Labs on Admission:  Basic Metabolic Panel:  Recent Labs Lab 10/02/17 0746 10/02/17 0746 10/05/17 1210  NA 142  --  137  K 4.6  --  3.5  CL  --   --  102  CO2 26  --  24  GLUCOSE 108  --  110*  BUN 16.5  --  13  CREATININE 0.9  --  0.72  CALCIUM 9.1  --  8.5*  MG 2.3  --   --   PHOS  --  4.0  --    Liver Function Tests:  Recent Labs Lab 10/02/17 0746 10/05/17 1210  AST 17 19  ALT 12 16*  ALKPHOS 65 62  BILITOT 0.36 0.9  PROT 6.9 6.4*  ALBUMIN 3.7 3.5   No results for input(s): LIPASE, AMYLASE in the last 168 hours. No results for input(s): AMMONIA in the last 168 hours. CBC:  Recent Labs Lab 10/02/17 0746 10/05/17 1210  WBC 6.1 7.9  NEUTROABS 3.2 6.2  HGB 12.6* 11.7*  HCT 38.5 35.4*  MCV 97.5 94.7  PLT 266 257   Cardiac Enzymes: No results for input(s): CKTOTAL, CKMB, CKMBINDEX, TROPONINI in the last 168 hours.  BNP (last 3 results) No results for input(s): BNP in the last 8760 hours.  ProBNP (last 3 results) No results for input(s): PROBNP in the last 8760 hours.   Serum creatinine: 0.72 mg/dL 10/05/17 1210 Estimated creatinine clearance: 76.9 mL/min  CBG: No results for input(s): GLUCAP in the last 168 hours.  Radiological Exams on Admission: Dg Chest 2 View  Result Date: 10/05/2017 CLINICAL DATA:  Non-Hodgkin's lymphoma EXAM: CHEST  2 VIEW COMPARISON:  09/03/2017 PET-CT FINDINGS: The heart is borderline enlarged. Normal vascularity. Left jugular Port-A-Cath is in place with its tip at the cavoatrial junction. Linear atelectasis towards the lung bases. No evidence of a enlarging lung mass or developing consolidation. No pneumothorax or pleural effusion. IMPRESSION: No active cardiopulmonary disease. Electronically Signed   By: Marybelle Killings M.D.   On: 10/05/2017 13:32   Ct Orbits W Contrast  Result Date:  10/05/2017 CLINICAL DATA:  Low-grade temperature following recent chemotherapy for non-Hodgkin's lymphoma. Swelling under the upright MRI. Initial encounter. EXAM: CT ORBITS WITH CONTRAST TECHNIQUE: Multidetector CT images was performed according to the standard protocol following intravenous contrast administration. CONTRAST:  75 mL Isovue-300 COMPARISON:  None. FINDINGS: Orbits: Bilateral lens replacements are present. There is slight elongation of the globes. No focal lesions are present. The optic nerves and rectus musculature is within normal limits. Orbital apex is normal bilaterally. Visualized sinuses: Asymmetric mucosal thickening is present throughout the left maxillary sinus and ethmoid air cells anteriorly. There is a fluid level in the left maxillary sinus. Soft tissue obscures the ostiomeatal complex. There is also soft tissue obstruction of the  right ostiomeatal complex. Aeration of the right ethmoid air cells is better than on the left. There is supraorbital extension of the frontal sinuses. There is lateral extension of the sphenoid sinuses bilaterally with onodi cells. Mild mucosal thickening is present in the maxillary sinuses bilaterally. Mastoid air cells are clear. Soft tissues: No significant soft tissue inflammation or collection is present. Limited intracranial: Within normal limits. IMPRESSION: 1. Asymmetric anterior left paranasal sinus disease with obstruction of the ostiomeatal complex bilaterally, left greater than right. 2. Supraorbital extension of frontal sinuses bilaterally. 3. No significant superficial or deep soft tissue lesion or abscess. Electronically Signed   By: San Morelle M.D.   On: 10/05/2017 14:00    EKG: no new  Assessment/Plan Active Problems:   Fever  Fever/Cellulitis on Chemo No eye pain and nl EOM Gentle IV fluids PRN Tylenol Spoke to patient's oncologist who advises antibiotics monitoring overnight.  If does well can be discharged home on  oral antibiotics. Blood culture ordered by EDP Eye drops Acuity check Cont vanc and zosyn Had flu shot  NHL Phone recs by onc appreciated, patient's oncologist says that he can follow-up outpatient upon discharge unless he becomes neutropenic or otherwise worsens.   Code Status: FC  DVT Prophylaxis: SCDs Family Communication: wife at bedside Disposition Plan: Pending Improvement  Status: obs tele  Elwin Mocha, MD Family Medicine [facility] [email or web address] Password [email or web address]

## 2017-10-05 NOTE — ED Notes (Signed)
Call report DAna @ [phone]

## 2017-10-06 DIAGNOSIS — L0390 Cellulitis, unspecified: Secondary | ICD-10-CM | POA: Diagnosis not present

## 2017-10-06 DIAGNOSIS — L03211 Cellulitis of face: Secondary | ICD-10-CM | POA: Diagnosis not present

## 2017-10-06 LAB — BASIC METABOLIC PANEL
ANION GAP: 8 (ref 5–15)
BUN: 9 mg/dL (ref 6–20)
CHLORIDE: 108 mmol/L (ref 101–111)
CO2: 24 mmol/L (ref 22–32)
Calcium: 8.2 mg/dL — ABNORMAL LOW (ref 8.9–10.3)
Creatinine, Ser: 0.68 mg/dL (ref 0.61–1.24)
GFR calc non Af Amer: >60 mL/min (ref >60)
Glucose, Bld: 107 mg/dL — ABNORMAL HIGH (ref 65–99)
POTASSIUM: 3.8 mmol/L (ref 3.5–5.1)
SODIUM: 140 mmol/L (ref 135–145)

## 2017-10-06 LAB — CBC
HEMATOCRIT: 33.3 % — AB (ref 39.0–52.0)
HEMOGLOBIN: 11.1 g/dL — AB (ref 13.0–17.0)
MCH: 31.7 pg (ref 26.0–34.0)
MCHC: 33.3 g/dL (ref 30.0–36.0)
MCV: 95.1 fL (ref 78.0–100.0)
Platelets: 227 10*3/uL (ref 150–400)
RBC: 3.5 MIL/uL — AB (ref 4.22–5.81)
RDW: 14.6 % (ref 11.5–15.5)
WBC: 5.1 10*3/uL (ref 4.0–10.5)

## 2017-10-06 MED ORDER — ACETAMINOPHEN 325 MG PO TABS: 650.0000 mg | ORAL_TABLET | Freq: Four times a day (QID) | ORAL | 0 refills | Status: DC | PRN

## 2017-10-06 MED ORDER — DOXYCYCLINE HYCLATE 100 MG PO CAPS: 100.0000 mg | ORAL_CAPSULE | Freq: Two times a day (BID) | ORAL | 0 refills | Status: AC

## 2017-10-06 MED ORDER — CIPROFLOXACIN HCL 500 MG PO TABS: 500.0000 mg | ORAL_TABLET | Freq: Two times a day (BID) | ORAL | 0 refills | Status: DC

## 2017-10-06 MED ORDER — HEPARIN SOD (PORK) LOCK FLUSH 100 UNIT/ML IV SOLN
500.0000 [IU] | INTRAVENOUS | Status: AC | PRN
  Administered 2017-10-06: 500 [IU]

## 2017-10-06 NOTE — Discharge Summary (Signed)
Physician Discharge Summary  Todd Bell WLN:[id number] DOB: Jun 15, 1946 DOA: 10/05/2017  PCP: Lujean Amel, MD  Admit date: 10/05/2017 Discharge date: 10/06/2017  Recommendations for Outpatient Follow-up:  1. Continue doxycycline for 5 days on discharge   Discharge Diagnoses:  Active Problems:   Fever   Cellulitis    Discharge Condition: stable   Diet recommendation: as tolerated   History of present illness:   Per HPI "71 y.o. male patient with past medical history significant for non-Hodgkin's lymphoma undergoing chemotherapy recently finishing his fourth round of therapy last week.  Presents to the hospital with chief complaint of fever fatigue.  Patient did tried antipyretic at home which was effective.  Contacted his oncologist who advised him to come to the emergency room for evaluation since he recently had chemo. ED course: Patient has some facial erythema.  CT orbits negative for periorbital cellulitis.  Hospitalist consulted for admission."  Hospital Course:   Active Problems:   Fever / Cellulitis, facial - Continue Doxycycline for 5 days on discharge - Blood cx pending, pt aware, will call back the pt if blood cx positive - Resume home meds     Signed:  Leisa Lenz, MD  [facility] 10/06/2017, 12:32 PM  Pager #: [phone]  Time spent in minutes: more than 30 minutes  Procedures:  None   Consultations:  Oncology   Discharge Exam: Vitals:   10/05/17 2140 10/06/17 0603  BP: 129/69 114/77  Pulse: 82 79  Resp: 16 15  Temp: 100.1 F (37.8 C) 98.5 F (36.9 C)  SpO2: 97% 99%   Vitals:   10/05/17 1600 10/05/17 1739 10/05/17 2140 10/06/17 0603  BP: (!) 142/94 131/86 129/69 114/77  Pulse: 92 86 82 79  Resp: 18 20 16 15   Temp:  98.1 F (36.7 C) 100.1 F (37.8 C) 98.5 F (36.9 C)  TempSrc:  Oral Oral Oral  SpO2: 99% 100% 97% 99%  Weight:  77 kg (169 lb 12.1 oz)  77.7 kg (171 lb 4.8 oz)  Height:  5\' 7"  (1.702 m)       General: Pt is alert, follows commands appropriately, not in acute distress Cardiovascular: Regular rate and rhythm, S1/S2 + Respiratory: Clear to auscultation bilaterally, no wheezing, no crackles, no rhonchi Abdominal: Soft, non tender, non distended, bowel sounds +, no guarding Extremities: no cyanosis, pulses palpable bilaterally DP and PT Neuro: Grossly nonfocal  Discharge Instructions  Discharge Instructions    Call MD for:  difficulty breathing, headache or visual disturbances    Complete by:  As directed    Call MD for:  redness, tenderness, or signs of infection (pain, swelling, redness, odor or green/yellow discharge around incision site)    Complete by:  As directed    Call MD for:  severe uncontrolled pain    Complete by:  As directed    Diet - low sodium heart healthy    Complete by:  As directed    Discharge instructions    Complete by:  As directed       Increase activity slowly    Complete by:  As directed      Allergies as of 10/06/2017   No Known Allergies     Medication List    STOP taking these medications   allopurinol 100 MG tablet Commonly known as:  ZYLOPRIM   cephALEXin 500 MG capsule Commonly known as:  KEFLEX     TAKE these medications   acetaminophen 325 MG tablet Commonly known as:  TYLENOL Take  2 tablets (650 mg total) by mouth every 6 (six) hours as needed for mild pain (or Fever >/= 101).   doxycycline 100 MG capsule Commonly known as:  VIBRAMYCIN Take 1 capsule (100 mg total) by mouth 2 (two) times daily.   ibuprofen 200 MG tablet Commonly known as:  ADVIL,MOTRIN Take 400 mg by mouth daily as needed for fever or moderate pain.   lidocaine-prilocaine cream Commonly known as:  EMLA Apply 1 application topically as needed.   ondansetron 8 MG tablet Commonly known as:  ZOFRAN Take 1 tablet (8 mg total) by mouth 2 (two) times daily as needed for refractory nausea / vomiting. Start on day 3 after cyclophosphamide.    prochlorperazine 10 MG tablet Commonly known as:  COMPAZINE Take 1 tablet (10 mg total) by mouth every 6 (six) hours as needed (Nausea or vomiting).   ROBITUSSIN SUGAR FREE 10-100 MG/5ML liquid Generic drug:  Dextromethorphan-Guaifenesin Take 20 mLs by mouth 2 (two) times daily as needed (congestion and cough).       Follow-up Information    Koirala, Dibas, MD Follow up.   Specialty:  Family Medicine Contact information: [address] [place] [id number] [phone]            The results of significant diagnostics from this hospitalization (including imaging, microbiology, ancillary and laboratory) are listed below for reference.    Significant Diagnostic Studies: Dg Chest 2 View  Result Date: 10/05/2017 CLINICAL DATA:  Non-Hodgkin's lymphoma EXAM: CHEST  2 VIEW COMPARISON:  09/03/2017 PET-CT FINDINGS: The heart is borderline enlarged. Normal vascularity. Left jugular Port-A-Cath is in place with its tip at the cavoatrial junction. Linear atelectasis towards the lung bases. No evidence of a enlarging lung mass or developing consolidation. No pneumothorax or pleural effusion. IMPRESSION: No active cardiopulmonary disease. Electronically Signed   By: Marybelle Killings M.D.   On: 10/05/2017 13:32   Ct Orbits W Contrast  Result Date: 10/05/2017 CLINICAL DATA:  Low-grade temperature following recent chemotherapy for non-Hodgkin's lymphoma. Swelling under the upright MRI. Initial encounter. EXAM: CT ORBITS WITH CONTRAST TECHNIQUE: Multidetector CT images was performed according to the standard protocol following intravenous contrast administration. CONTRAST:  75 mL Isovue-300 COMPARISON:  None. FINDINGS: Orbits: Bilateral lens replacements are present. There is slight elongation of the globes. No focal lesions are present. The optic nerves and rectus musculature is within normal limits. Orbital apex is normal bilaterally. Visualized sinuses: Asymmetric mucosal  thickening is present throughout the left maxillary sinus and ethmoid air cells anteriorly. There is a fluid level in the left maxillary sinus. Soft tissue obscures the ostiomeatal complex. There is also soft tissue obstruction of the right ostiomeatal complex. Aeration of the right ethmoid air cells is better than on the left. There is supraorbital extension of the frontal sinuses. There is lateral extension of the sphenoid sinuses bilaterally with onodi cells. Mild mucosal thickening is present in the maxillary sinuses bilaterally. Mastoid air cells are clear. Soft tissues: No significant soft tissue inflammation or collection is present. Limited intracranial: Within normal limits. IMPRESSION: 1. Asymmetric anterior left paranasal sinus disease with obstruction of the ostiomeatal complex bilaterally, left greater than right. 2. Supraorbital extension of frontal sinuses bilaterally. 3. No significant superficial or deep soft tissue lesion or abscess. Electronically Signed   By: San Morelle M.D.   On: 10/05/2017 14:00    Microbiology: No results found for this or any previous visit (from the past 240 hour(s)).   Labs: Basic Metabolic Panel:  Recent Labs Lab 10/02/17 0746 10/02/17 0746 10/05/17 1210 10/06/17 0449  NA 142  --  137 140  K 4.6  --  3.5 3.8  CL  --   --  102 108  CO2 26  --  24 24  GLUCOSE 108  --  110* 107*  BUN 16.5  --  13 9  CREATININE 0.9  --  0.72 0.68  CALCIUM 9.1  --  8.5* 8.2*  MG 2.3  --   --   --   PHOS  --  4.0  --   --    Liver Function Tests:  Recent Labs Lab 10/02/17 0746 10/05/17 1210  AST 17 19  ALT 12 16*  ALKPHOS 65 62  BILITOT 0.36 0.9  PROT 6.9 6.4*  ALBUMIN 3.7 3.5   No results for input(s): LIPASE, AMYLASE in the last 168 hours. No results for input(s): AMMONIA in the last 168 hours. CBC:  Recent Labs Lab 10/02/17 0746 10/05/17 1210 10/06/17 0449  WBC 6.1 7.9 5.1  NEUTROABS 3.2 6.2  --   HGB 12.6* 11.7* 11.1*  HCT 38.5  35.4* 33.3*  MCV 97.5 94.7 95.1  PLT 266 257 227   Cardiac Enzymes: No results for input(s): CKTOTAL, CKMB, CKMBINDEX, TROPONINI in the last 168 hours. BNP: BNP (last 3 results) No results for input(s): BNP in the last 8760 hours.  ProBNP (last 3 results) No results for input(s): PROBNP in the last 8760 hours.  CBG: No results for input(s): GLUCAP in the last 168 hours.

## 2017-10-06 NOTE — Progress Notes (Signed)
IP PROGRESS NOTE  Subjective:  Todd Bell a 71 year old male undergoing systemic chemotherapy for diagnosis of diffuse large B-cell lymphoma, stage II of the cervical and mediastinal regions. Patient has received 3 cycles of chemotherapy so far. He was admitted yesterday after developing febrile illness over the weekend. When patient initially contacted our clinic it seemed that he had a high fevers up to 103, when reality she is fever at home was no higher than 100.2. Port associated with subjective fever, chills, no rigors. Patient did have some upper respiratory tract symptoms and postnasal drip.  On our request and patient presented to the emergency room and was evaluated. He continued to have low-grade fever, he was not neutropenic, and imaging of the chest with chest x-ray did not demonstrate any consolidation the lungs. Patient was admitted to the hospital for parenteral antibiotic therapy. Today, patient reports feeling well and denies any subjective fevers. Denies any significant sinus congestion. Cough that he has had last week has resolved. Overnight, maximum temperature was 100.3.  Objective: Vital signs in last 24 hours: Blood pressure 114/77, pulse 79, temperature 98.5 F (36.9 C), temperature source Oral, resp. rate 15, height 5\' 7"  (1.702 m), weight 171 lb 4.8 oz (77.7 kg), SpO2 99 %.  Intake/Output from previous day: 10/29 0701 - 10/30 0700 In: 2263.3 [P.O.:240; I.V.:623.3; IV Piggyback:1400] Out: 300 [Urine:300]  Physical Exam:  Patient is alert, awake, oriented 3. No acute distress. HEENT: Mild erythema without significant swelling underneath the right eye. No tenderness to palpation, no purulent drainage from the eye. Lungs: Clear to auscultation bilaterally without expiratory wheezing. Cardiac: S1/S2, no murmurs, rubs, gallops. No lower extremity edema. Abdomen: Soft, nontender, nondistended. Bowel sounds are normoactive. Extremities: Well-perfused, warm without  swelling. Neuro: No gross focal neurological deficits  Portacath/PICC-without erythema  Lab Results:  Recent Labs  10/05/17 1210 10/06/17 0449  WBC 7.9 5.1  HGB 11.7* 11.1*  HCT 35.4* 33.3*  PLT 257 227    BMET  Recent Labs  10/05/17 1210 10/06/17 0449  NA 137 140  K 3.5 3.8  CL 102 108  CO2 24 24  GLUCOSE 110* 107*  BUN 13 9  CREATININE 0.72 0.68  CALCIUM 8.5* 8.2*    No results found for: CEA1  Studies/Results: Dg Chest 2 View  Result Date: 10/05/2017 CLINICAL DATA:  Non-Hodgkin's lymphoma EXAM: CHEST  2 VIEW COMPARISON:  09/03/2017 PET-CT FINDINGS: The heart is borderline enlarged. Normal vascularity. Left jugular Port-A-Cath is in place with its tip at the cavoatrial junction. Linear atelectasis towards the lung bases. No evidence of a enlarging lung mass or developing consolidation. No pneumothorax or pleural effusion. IMPRESSION: No active cardiopulmonary disease. Electronically Signed   By: Marybelle Killings M.D.   On: 10/05/2017 13:32   Ct Orbits W Contrast  Result Date: 10/05/2017 CLINICAL DATA:  Low-grade temperature following recent chemotherapy for non-Hodgkin's lymphoma. Swelling under the upright MRI. Initial encounter. EXAM: CT ORBITS WITH CONTRAST TECHNIQUE: Multidetector CT images was performed according to the standard protocol following intravenous contrast administration. CONTRAST:  75 mL Isovue-300 COMPARISON:  None. FINDINGS: Orbits: Bilateral lens replacements are present. There is slight elongation of the globes. No focal lesions are present. The optic nerves and rectus musculature is within normal limits. Orbital apex is normal bilaterally. Visualized sinuses: Asymmetric mucosal thickening is present throughout the left maxillary sinus and ethmoid air cells anteriorly. There is a fluid level in the left maxillary sinus. Soft tissue obscures the ostiomeatal complex. There is also soft tissue obstruction  of the right ostiomeatal complex. Aeration of the  right ethmoid air cells is better than on the left. There is supraorbital extension of the frontal sinuses. There is lateral extension of the sphenoid sinuses bilaterally with onodi cells. Mild mucosal thickening is present in the maxillary sinuses bilaterally. Mastoid air cells are clear. Soft tissues: No significant soft tissue inflammation or collection is present. Limited intracranial: Within normal limits. IMPRESSION: 1. Asymmetric anterior left paranasal sinus disease with obstruction of the ostiomeatal complex bilaterally, left greater than right. 2. Supraorbital extension of frontal sinuses bilaterally. 3. No significant superficial or deep soft tissue lesion or abscess. Electronically Signed   By: San Morelle M.D.   On: 10/05/2017 14:00    Medications: I have reviewed the patient's current medications.  Assessment/Plan: 71 year old male with diffuse large B-cell lymphoma, limited stage, undergoing systemic chemoimmunotherapy with R-CHOP. Admitted due to low-grade fevers at home with suspicion for possible periorbital cellulitis in the right face. Still had low-grade fever overnight, erythema around the orbit improved dramatically. Patient is not currently neutropenic, but white blood cell is gradually declining. He will likely become neutropenic over the next 3-5 days.  Recommendations: --I believe switching to oral antibiotics is reasonable at this time targeting cutaneous pathogens. --Discharge home discretion of the hospitalist service, possibly later on today. --Patient has close follow-up with [place] and has been instructed to continue doing so. I have reviewed the signs and symptoms of worsening infection with the patient and his wife. There are no to present back to the emergency room if Todd Bell develops progressive fevers   LOS: 0 days   Ardath Sax, MD   10/06/2017, 6:51 AM

## 2017-10-06 NOTE — Discharge Instructions (Signed)
Fever, Adult A fever is an increase in the body's temperature. It is often defined as a temperature of 100 F (38C) or higher. Short mild or moderate fevers often have no long-term effects. They also often do not need treatment. Moderate or high fevers may make you feel uncomfortable. Sometimes, they can also be a sign of a serious illness or disease. The sweating that may happen with repeated fevers or fevers that last a while may also cause you to not have enough fluid in your body (dehydration). You can take your temperature with a thermometer to see if you have a fever. A measured temperature can change with:  Age.  Time of day.  Where the thermometer is placed: ? Mouth (oral). ? Rectum (rectal). ? Ear (tympanic). ? Underarm (axillary). ? Forehead (temporal).  Follow these instructions at home: Pay attention to any changes in your symptoms. Take these actions to help with your condition:  Take over-the-counter and prescription medicines only as told by your doctor. Follow the dosing instructions carefully.  If you were prescribed an antibiotic medicine, take it as told by your doctor. Do not stop taking the antibiotic even if you start to feel better.  Rest as needed.  Drink enough fluid to keep your pee (urine) clear or pale yellow.  Sponge yourself or bathe with room-temperature water as needed. This helps to lower your body temperature . Do not use ice water.  Do not wear too many blankets or heavy clothes.  Contact a doctor if:  You throw up (vomit).  You cannot eat or drink without throwing up.  You have watery poop (diarrhea).  It hurts when you pee.  Your symptoms do not get better with treatment.  You have new symptoms.  You feel very weak. Get help right away if:  You are short of breath or have trouble breathing.  You are dizzy or you pass out (faint).  You feel confused.  You have signs of not having enough fluid in your body, such as: ? A dry  mouth. ? Peeing less. ? Looking pale.  You have very bad pain in your belly (abdomen).  You keep throwing up or having water poop.  You have a skin rash.  Your symptoms suddenly get worse. This information is not intended to replace advice given to you by your health care provider. Make sure you discuss any questions you have with your health care provider. Document Released: 09/02/2008 Document Revised: 05/01/2016 Document Reviewed: 01/18/2015 Elsevier Interactive Patient Education  2018 [employer]  

## 2017-10-10 LAB — CULTURE, BLOOD (ROUTINE X 2)
CULTURE: NO GROWTH
Culture: NO GROWTH
SPECIAL REQUESTS: ADEQUATE
SPECIAL REQUESTS: ADEQUATE

## 2017-10-13 DIAGNOSIS — C8331 Diffuse large B-cell lymphoma, lymph nodes of head, face, and neck: Secondary | ICD-10-CM | POA: Diagnosis not present

## 2017-10-13 DIAGNOSIS — J029 Acute pharyngitis, unspecified: Secondary | ICD-10-CM | POA: Diagnosis not present

## 2017-10-13 NOTE — Assessment & Plan Note (Signed)
70 y.o. male with diagnosis of activated B-cell diffuse large B-cell lymphoma of the lymph nodes of the right side of the neck. Currently available staging information suggests presence of stage II bulky disease. Based on the diagnosis, patient was started on R-CHOP chemotherapy and has completed 2 cycles of therapy without significant complications sofar. There has been significant reduction in the bulk of the lymphoma by examination. Interval PET/CT demonstrates strong partial response, although we still see an area of increased FDG uptake in the main disease focus.  Clinical evaluation and lab work today permissive to proceed with the next cycle of therapy. Patient will be planned for 6 cycles of treatment, subsequently, I recommend patient to undergo possible adjuvant involved field radiation therapy due to initial bulky disease in the neck as well as only partial response to the initiation of therapy.  Plan: --Proceed with the 4th cycle of systemic chemotherapy with R-CHOP --Repeat PET/CT after completion of 6 cycles of therapy.

## 2017-10-13 NOTE — Progress Notes (Signed)
[name]:  Assessment: Diffuse large B-cell lymphoma of lymph nodes of neck ([place]) 71 y.o. male with diagnosis of activated B-cell diffuse large B-cell lymphoma of the lymph nodes of the right side of the neck. Currently available staging information suggests presence of stage II bulky disease. Based on the diagnosis, patient was started on R-CHOP chemotherapy and has completed 2 cycles of therapy without significant complications sofar. There has been significant reduction in the bulk of the lymphoma by examination. Interval PET/CT demonstrates strong partial response, although we still see an area of increased FDG uptake in the main disease focus.  Clinical evaluation and lab work today permissive to proceed with the next cycle of therapy. Patient will be planned for 6 cycles of treatment, subsequently, I recommend patient to undergo possible adjuvant involved field radiation therapy due to initial bulky disease in the neck as well as only partial response to the initiation of therapy.  Plan: --Proceed with the 4th cycle of systemic chemotherapy with R-CHOP --Repeat PET/CT after completion of 6 cycles of therapy.  --return to clinic in 3 weeks: Labs, clinic visit, possible 5th cycle of systemic chemotherapy  Voice recognition software was used and creation of this note. Despite my best effort at editing the text, some misspelling/errors may have occurred.  No orders of the defined types were placed in this encounter.   Cancer Staging Diffuse large B-cell lymphoma of lymph nodes of neck (HCC) Staging form: Hodgkin and Non-Hodgkin Lymphoma, AJCC 8th Edition - Clinical stage from 07/13/2017: Stage II bulky (Diffuse large B-cell lymphoma) - Signed by Ardath Sax, MD on 07/20/2017   All questions were answered.  . The patient knows to call the clinic with any problems, questions or concerns.  This note was electronically signed.    History of  Presenting Illness Todd Bell is a  71 y.o. [place] male follwed in the [place] for diagnosis of diffuse large B-cell lymphoma.  Please see oncologic history below for details of the diagnosis. Patient has initially discovered asymmetry in his neck with visible mass earlier in July. He was seen by his primary care provider and subsequently directly to be evaluated by ENT. Patient underwent initially ultrasound of the neck demonstrating a large mass, subsequently he underwent CT of the neck on 06/15/17 demonstrating normal laryngeal, pharyngeal, salivary structures and a 5.0 cm supraclavicular mass on the right side. CT of the chest obtained the same time demonstrated mass, but no lymphadenopathy in the mediastinum, hilar structures, and a 0.4 cm right upper lobe nodule and a 0.5 cm left upper lobe nodule. Patient underwent an FNA on 06/17/17 which demonstrated presence of malignant cells that could not be further characterized due to insufficient sample. Initially, patient denied any significant symptoms in association with this mass. He denied any fevers, chills, night sweats. No significant weight loss. No changes to his activities of daily living or activity tolerance. Denied changes to his appetite. Denied chest pain, shortness of breath or cough. [name] had no nausea, early satiety, abdominal pain, diarrhea, or constipation. No dysuria or hematuria. Denied any cutaneous changes. Denied any vision changes, hearing changes, swallowing difficulties, any focal weakness or sensory deficits in the face or extremities.  Patient is currently undergoing curative-intent chemotherapy with R-CHOP.  She returns to the clinic for possible administration of the fourth cycle of therapy.  In the interim, patient reports doing well with no additional fevers.  He reports having a possible upper respiratory tract  infection previous week with some coughing and sneezing on Friday.  All symptoms have  resolved at this time, patient has remained afebrile at home.  No new complaints.  Oncological/hematological History:   Diffuse large B-cell lymphoma of lymph nodes of neck ([place])   06/15/2017 Imaging    CT Neck/Chest: Normal laryngeal, pharyngeal, salivary structures and a 5.0 cm supraclavicular mass on the right side.No lymphadenopathy in the mediastinum, hilar structures, and a 0.4 cm right upper lobe nodule and a 0.5 cm left upper lobe nodule.       06/30/2017 Imaging    PET-CT: Highly hypermetabolic right conglomerate supraclavicular mass. This is accompanied by accentuated metabolic activity in the palatine tonsillar pillars, a hypermetabolic right buccinator lymph node, hypermetabolic thoracic lymph nodes including a mildly enlarged AP window lymph node and other upper normal size but mildly hypermetabolic hilar and infrahilar nodes. Appearance compatible with malignancy such as lymphoma or adenocarcinoma      07/13/2017 Pathology Results    Rt Neck mass core bx: Sheets of large lymphocytes discernible lymph node structure, consistent with diffuse large B-cell lymphoma. IHC -- positive for LCA, CD20, CD79a, PAX-5, BCL-6 & negative for CD10, CD34, BCL-2, CD30, CD138, kappa or lambda light chains      07/17/2017 Initial Diagnosis    Diffuse large B-cell lymphoma of lymph nodes of neck ([place])     07/29/2017 Procedure    Placement of left IJ power-injectable port catheter.       07/31/2017 -  Chemotherapy    R-CHOP21: Rituximab 320m/m2, d1 + Cyclophosphamide 7561mm2, d1 + Doxorubicin 5055m2, d1 + Vincristine 2mg48m1 + Prednisone 60mg53mQDay, d1-5 Q21 d x6 cycles planned --Cycle #1, 07/31/17: --Cycle #2, 08/21/17: --Cycle #3, 09/11/17:  --Cycle #4, 10/02/17:       09/03/2017 Imaging    PET-CT: The index nodal mass in the right supraclavicular region measures 3.5 cm and has an SUV max equal to 6.33 (Deauville Criteria 4). On the previous exam this measured 7.7 cm and had an SUV max equal  to 29. The right buccinator node measures 6 mm and has an SUV max equal to 3.13 (Deauville Criteria 3). Previously 7 mm with an SUV max equal to 10.1. Mild increased FDG uptake associated with mediastinal and bilateral hilar regions. The index AP window lymph node measures 11 mm and has an SUV max equal to 3.93. (Deauville Criteria 4). Previously this measured 1.4 cm and had an SUV max of 4.9.       Medical History: Past Medical History:  Diagnosis Date  . Cancer (HCC) [place] Medical history non-contributory   . Non Hodgkin's lymphoma (HCC)[facility] Surgical History: Past Surgical History:  Procedure Laterality Date  . CATARACT EXTRACTION    . COLONOSCOPY  02/2016  . IR FLUORO GUIDE PORT INSERTION LEFT  07/29/2017  . IR US [place] VASC ACCESS LEFT  07/29/2017    Family History: Family History  Problem Relation Age of Onset  . Cancer Father        Pancreatic    Social History: Social History   Socioeconomic History  . Marital status: Married    Spouse name: Not on file  . Number of children: Not on file  . Years of education: Not on file  . Highest education level: Not on file  Social Needs  . Financial resource strain: Not on file  . Food insecurity - worry: Not on file  . Food insecurity - inability: Not on  file  . Transportation needs - medical: Not on file  . Transportation needs - non-medical: Not on file  Occupational History  . Not on file  Tobacco Use  . Smoking status: Former Smoker    Last attempt to quit: 1988    Years since quitting: 30.8  . Smokeless tobacco: Never Used  . Tobacco comment: he was a social drinker  Substance and Sexual Activity  . Alcohol use: Yes    Comment: occasional  . Drug use: No  . Sexual activity: Not on file  Other Topics Concern  . Not on file  Social History Narrative  . Not on file    Allergies: No Known Allergies  Medications:  Current Outpatient Medications  Medication Sig Dispense Refill  . lidocaine-prilocaine  (EMLA) cream Apply 1 application topically as needed. 30 g 6  . acetaminophen (TYLENOL) 325 MG tablet Take 2 tablets (650 mg total) by mouth every 6 (six) hours as needed for mild pain (or Fever >/= 101). 30 tablet 0  . Dextromethorphan-Guaifenesin (ROBITUSSIN SUGAR FREE) 10-100 MG/5ML liquid Take 20 mLs by mouth 2 (two) times daily as needed (congestion and cough).    Marland Kitchen ibuprofen (ADVIL,MOTRIN) 200 MG tablet Take 400 mg by mouth daily as needed for fever or moderate pain.    Marland Kitchen ondansetron (ZOFRAN) 8 MG tablet Take 1 tablet (8 mg total) by mouth 2 (two) times daily as needed for refractory nausea / vomiting. Start on day 3 after cyclophosphamide. 30 tablet 1  . prochlorperazine (COMPAZINE) 10 MG tablet Take 1 tablet (10 mg total) by mouth every 6 (six) hours as needed (Nausea or vomiting). 30 tablet 6   No current facility-administered medications for this visit.     Review of Systems: Review of Systems  All other systems reviewed and are negative.    PHYSICAL EXAMINATION Blood pressure 127/82, pulse 64, temperature 97.7 F (36.5 C), temperature source Oral, resp. rate 17, height 5' 7" (1.702 m), weight 167 lb (75.8 kg), SpO2 98 %.  ECOG PERFORMANCE STATUS: 1 - Symptomatic but completely ambulatory  Physical Exam  Constitutional: He is oriented to person, place, and time and well-developed, well-nourished, and in no distress. Vital signs are normal. He appears not malnourished, not dehydrated and not jaundiced. He does not have a sickly appearance. No distress.  HENT:  Mouth/Throat: Uvula is midline, oropharynx is clear and moist and mucous membranes are normal. Mucous membranes are not pale, not dry and not cyanotic. He has dentures. Normal dentition.  Eyes: Conjunctivae and EOM are normal. Pupils are equal, round, and reactive to light. No scleral icterus.  Neck: No JVD present. Carotid bruit is not present.  Cardiovascular: Normal rate, regular rhythm and normal heart sounds.  No  murmur heard. Pulmonary/Chest: Effort normal and breath sounds normal. He has no decreased breath sounds. He has no wheezes. He has no rhonchi. He has no rales.  Abdominal: Soft. Normal appearance and normal aorta. There is no hepatosplenomegaly. There is no tenderness.  Lymphadenopathy:       Head (right side): No submental, no submandibular, no tonsillar and no occipital adenopathy present.       Head (left side): No submental, no submandibular, no tonsillar and no occipital adenopathy present.    He has no cervical adenopathy.       Right cervical: No posterior cervical adenopathy present.      Left cervical: No superficial cervical, no deep cervical and no posterior cervical adenopathy present.    He has  no axillary adenopathy.       Right: No inguinal and no supraclavicular adenopathy present.       Left: No inguinal and no supraclavicular adenopathy present.  Complete resolution of the previously palpable bulky mass in the right lower neck/supraclavicular fossa.  Neurological: He is alert and oriented to person, place, and time. He has normal sensation, normal strength, normal reflexes and intact cranial nerves.  Skin: He is not diaphoretic.     LABORATORY DATA: I have personally reviewed the data as listed: Appointment on 10/02/2017  Component Date Value Ref Range Status  . WBC 10/02/2017 6.1  4.0 - 10.3 10e3/uL Final  . NEUT# 10/02/2017 3.2  1.5 - 6.5 10e3/uL Final  . HGB 10/02/2017 12.6* 13.0 - 17.1 g/dL Final  . HCT 10/02/2017 38.5  38.4 - 49.9 % Final  . Platelets 10/02/2017 266  140 - 400 10e3/uL Final  . MCV 10/02/2017 97.5  79.3 - 98.0 fL Final  . MCH 10/02/2017 31.9  27.2 - 33.4 pg Final  . MCHC 10/02/2017 32.7  32.0 - 36.0 g/dL Final  . RBC 10/02/2017 3.95* 4.20 - 5.82 10e6/uL Final  . RDW 10/02/2017 14.8* 11.0 - 14.6 % Final  . lymph# 10/02/2017 1.9  0.9 - 3.3 10e3/uL Final  . MONO# 10/02/2017 0.6  0.1 - 0.9 10e3/uL Final  . Eosinophils Absolute 10/02/2017 0.3  0.0 -  0.5 10e3/uL Final  . Basophils Absolute 10/02/2017 0.1  0.0 - 0.1 10e3/uL Final  . NEUT% 10/02/2017 52.0  39.0 - 75.0 % Final  . LYMPH% 10/02/2017 31.7  14.0 - 49.0 % Final  . MONO% 10/02/2017 10.2  0.0 - 14.0 % Final  . EOS% 10/02/2017 5.3  0.0 - 7.0 % Final  . BASO% 10/02/2017 0.8  0.0 - 2.0 % Final  . Sodium 10/02/2017 142  136 - 145 mEq/L Final  . Potassium 10/02/2017 4.6  3.5 - 5.1 mEq/L Final  . Chloride 10/02/2017 108  98 - 109 mEq/L Final  . CO2 10/02/2017 26  22 - 29 mEq/L Final  . Glucose 10/02/2017 108  70 - 140 mg/dl Final   Glucose reference range is for nonfasting patients. Fasting glucose reference range is 70- 100.  Marland Kitchen BUN 10/02/2017 16.5  7.0 - 26.0 mg/dL Final  . Creatinine 10/02/2017 0.9  0.7 - 1.3 mg/dL Final  . Total Bilirubin 10/02/2017 0.36  0.20 - 1.20 mg/dL Final  . Alkaline Phosphatase 10/02/2017 65  40 - 150 U/L Final  . AST 10/02/2017 17  5 - 34 U/L Final  . ALT 10/02/2017 12  0 - 55 U/L Final  . Total Protein 10/02/2017 6.9  6.4 - 8.3 g/dL Final  . Albumin 10/02/2017 3.7  3.5 - 5.0 g/dL Final  . Calcium 10/02/2017 9.1  8.4 - 10.4 mg/dL Final  . Anion Gap 10/02/2017 8  3 - 11 mEq/L Final  . EGFR 10/02/2017 >60  >60 ml/min/1.73 m2 Final   eGFR is calculated using the CKD-EPI Creatinine Equation (2009)  . Magnesium 10/02/2017 2.3  1.5 - 2.5 mg/dl Final  . Phosphorus, Ser 10/02/2017 4.0  2.5 - 4.5 mg/dL Final       Ardath Sax, MD

## 2017-10-16 ENCOUNTER — Encounter: Payer: Self-pay | Admitting: *Deleted

## 2017-10-23 ENCOUNTER — Ambulatory Visit (HOSPITAL_COMMUNITY)
Admission: RE | Admit: 2017-10-23 | Discharge: 2017-10-23 | Disposition: A | Discharge status: Home / Self Care | Payer: Medicare Other | Source: Ambulatory Visit | Attending: Hematology and Oncology | Admitting: Hematology and Oncology

## 2017-10-23 ENCOUNTER — Ambulatory Visit: Payer: Medicare Other

## 2017-10-23 ENCOUNTER — Ambulatory Visit (HOSPITAL_BASED_OUTPATIENT_CLINIC_OR_DEPARTMENT_OTHER): Payer: Medicare Other | Admitting: Hematology and Oncology

## 2017-10-23 ENCOUNTER — Encounter: Payer: Self-pay | Admitting: Hematology and Oncology

## 2017-10-23 ENCOUNTER — Telehealth: Payer: Self-pay

## 2017-10-23 ENCOUNTER — Encounter (HOSPITAL_COMMUNITY): Payer: Self-pay

## 2017-10-23 ENCOUNTER — Other Ambulatory Visit (HOSPITAL_BASED_OUTPATIENT_CLINIC_OR_DEPARTMENT_OTHER): Payer: Medicare Other

## 2017-10-23 ENCOUNTER — Other Ambulatory Visit: Payer: Self-pay

## 2017-10-23 VITALS — BP 124/67 | HR 110 | Temp 97.8°F | Resp 16 | Ht 67.0 in | Wt 171.9 lb

## 2017-10-23 DIAGNOSIS — E278 Other specified disorders of adrenal gland: Secondary | ICD-10-CM | POA: Insufficient documentation

## 2017-10-23 DIAGNOSIS — C8331 Diffuse large B-cell lymphoma, lymph nodes of head, face, and neck: Secondary | ICD-10-CM | POA: Diagnosis not present

## 2017-10-23 DIAGNOSIS — I700 Atherosclerosis of aorta: Secondary | ICD-10-CM | POA: Diagnosis not present

## 2017-10-23 DIAGNOSIS — R0602 Shortness of breath: Secondary | ICD-10-CM

## 2017-10-23 DIAGNOSIS — I77810 Thoracic aortic ectasia: Secondary | ICD-10-CM | POA: Diagnosis not present

## 2017-10-23 DIAGNOSIS — J189 Pneumonia, unspecified organism: Secondary | ICD-10-CM

## 2017-10-23 DIAGNOSIS — Z5112 Encounter for antineoplastic immunotherapy: Secondary | ICD-10-CM

## 2017-10-23 DIAGNOSIS — Z5111 Encounter for antineoplastic chemotherapy: Secondary | ICD-10-CM

## 2017-10-23 LAB — COMPREHENSIVE METABOLIC PANEL
ALBUMIN: 2.9 g/dL — AB (ref 3.5–5.0)
ALK PHOS: 82 U/L (ref 40–150)
ALT: 55 U/L (ref 0–55)
ANION GAP: 10 meq/L (ref 3–11)
AST: 40 U/L — ABNORMAL HIGH (ref 5–34)
BILIRUBIN TOTAL: 0.43 mg/dL (ref 0.20–1.20)
BUN: 12.5 mg/dL (ref 7.0–26.0)
CO2: 24 mEq/L (ref 22–29)
Calcium: 8.9 mg/dL (ref 8.4–10.4)
Chloride: 102 mEq/L (ref 98–109)
Creatinine: 0.8 mg/dL (ref 0.7–1.3)
GLUCOSE: 120 mg/dL (ref 70–140)
POTASSIUM: 3.7 meq/L (ref 3.5–5.1)
SODIUM: 136 meq/L (ref 136–145)
TOTAL PROTEIN: 7.2 g/dL (ref 6.4–8.3)

## 2017-10-23 LAB — CBC WITH DIFFERENTIAL/PLATELET
BASO%: 0.2 % (ref 0.0–2.0)
Basophils Absolute: 0.1 10*3/uL (ref 0.0–0.1)
EOS%: 0.3 % (ref 0.0–7.0)
Eosinophils Absolute: 0.1 10*3/uL (ref 0.0–0.5)
HEMATOCRIT: 34 % — AB (ref 38.4–49.9)
HEMOGLOBIN: 11 g/dL — AB (ref 13.0–17.1)
LYMPH#: 1.7 10*3/uL (ref 0.9–3.3)
LYMPH%: 7.2 % — ABNORMAL LOW (ref 14.0–49.0)
MCH: 31.1 pg (ref 27.2–33.4)
MCHC: 32.4 g/dL (ref 32.0–36.0)
MCV: 96 fL (ref 79.3–98.0)
MONO#: 1.4 10*3/uL — AB (ref 0.1–0.9)
MONO%: 6.3 % (ref 0.0–14.0)
NEUT%: 86 % — ABNORMAL HIGH (ref 39.0–75.0)
NEUTROS ABS: 19.7 10*3/uL — AB (ref 1.5–6.5)
PLATELETS: 411 10*3/uL — AB (ref 140–400)
RBC: 3.54 10*6/uL — ABNORMAL LOW (ref 4.20–5.82)
RDW: 15.4 % — AB (ref 11.0–14.6)
WBC: 22.9 10*3/uL — AB (ref 4.0–10.3)

## 2017-10-23 LAB — LACTATE DEHYDROGENASE: LDH: 363 U/L — ABNORMAL HIGH (ref 125–245)

## 2017-10-23 LAB — MAGNESIUM: Magnesium: 2.2 mg/dl (ref 1.5–2.5)

## 2017-10-23 MED ORDER — CEPHALEXIN 500 MG PO CAPS: 1000.0000 mg | ORAL_CAPSULE | Freq: Two times a day (BID) | ORAL | 0 refills | Status: AC

## 2017-10-23 MED ORDER — MOXIFLOXACIN HCL 400 MG PO TABS: 400.0000 mg | ORAL_TABLET | Freq: Every day | ORAL | 0 refills | Status: AC

## 2017-10-23 NOTE — Telephone Encounter (Signed)
Per Dr. Lebron Conners, patient has pneumonia. Per Dr. Lebron Conners, patient's wife called and made aware. Two antibiotic prescriptions sent to pt.'s pharmacy by Dr. Lebron Conners. Patient's wife made aware if symptoms worsen, to bring patient to the Emergency Department. Per Dr. Lebron Conners, push chemo back by 1 week. High priority scheduling message sent to schedule patient week of 11/26. Patient's wife also made aware of ECHO appointment on Monday 10/26/17 at 11:00. Voiced understanding of all instructions.

## 2017-10-24 LAB — PHOSPHORUS: Phosphorus, Ser: 2.8 mg/dL (ref 2.5–4.5)

## 2017-10-25 NOTE — Progress Notes (Signed)
Oncology Nurse Navigator Documentation  Spoke with patient, wife, dtr while waiting in MedOnc Scheduling s/p f/u appt with Dr. Lebron Conners.  Reported chemo cancelled today d/t elevated WBC, suspect PNA.  To have chest CT for further diagnosis.  I provided support, acknowledged their frustration.  Encouraged them to call me with needs/concerns.  They voiced understanding.  Gayleen Orem, RN, BSN, [place] [facility] Oncology Nurse [place] at [place] [phone]

## 2017-10-26 ENCOUNTER — Ambulatory Visit (HOSPITAL_COMMUNITY)
Admission: RE | Admit: 2017-10-26 | Discharge: 2017-10-26 | Disposition: A | Discharge status: Home / Self Care | Payer: Medicare Other | Source: Ambulatory Visit | Attending: Hematology and Oncology | Admitting: Hematology and Oncology

## 2017-10-26 DIAGNOSIS — I351 Nonrheumatic aortic (valve) insufficiency: Secondary | ICD-10-CM | POA: Insufficient documentation

## 2017-10-26 DIAGNOSIS — R0602 Shortness of breath: Secondary | ICD-10-CM | POA: Insufficient documentation

## 2017-10-26 NOTE — Progress Notes (Signed)
  Echocardiogram 2D Echocardiogram limited has been performed.  Todd Bell M 10/26/2017, 11:54 AM

## 2017-10-30 ENCOUNTER — Other Ambulatory Visit: Payer: Medicare Other

## 2017-10-30 ENCOUNTER — Ambulatory Visit: Payer: Medicare Other | Admitting: Hematology and Oncology

## 2017-11-06 ENCOUNTER — Ambulatory Visit (HOSPITAL_BASED_OUTPATIENT_CLINIC_OR_DEPARTMENT_OTHER): Payer: Medicare Other

## 2017-11-06 ENCOUNTER — Encounter: Payer: Self-pay | Admitting: *Deleted

## 2017-11-06 ENCOUNTER — Telehealth: Payer: Self-pay | Admitting: Hematology

## 2017-11-06 ENCOUNTER — Encounter: Payer: Self-pay | Admitting: Hematology and Oncology

## 2017-11-06 ENCOUNTER — Other Ambulatory Visit (HOSPITAL_BASED_OUTPATIENT_CLINIC_OR_DEPARTMENT_OTHER): Payer: Medicare Other

## 2017-11-06 ENCOUNTER — Ambulatory Visit (HOSPITAL_BASED_OUTPATIENT_CLINIC_OR_DEPARTMENT_OTHER): Payer: Medicare Other | Admitting: Hematology and Oncology

## 2017-11-06 VITALS — BP 120/73 | HR 98 | Temp 98.3°F | Resp 18 | Ht 67.0 in | Wt 165.4 lb

## 2017-11-06 VITALS — BP 104/73 | HR 82 | Temp 97.8°F | Resp 18

## 2017-11-06 DIAGNOSIS — C8331 Diffuse large B-cell lymphoma, lymph nodes of head, face, and neck: Secondary | ICD-10-CM | POA: Diagnosis not present

## 2017-11-06 DIAGNOSIS — Z5111 Encounter for antineoplastic chemotherapy: Secondary | ICD-10-CM | POA: Diagnosis not present

## 2017-11-06 DIAGNOSIS — Z5112 Encounter for antineoplastic immunotherapy: Secondary | ICD-10-CM | POA: Diagnosis not present

## 2017-11-06 DIAGNOSIS — Z5189 Encounter for other specified aftercare: Secondary | ICD-10-CM

## 2017-11-06 LAB — COMPREHENSIVE METABOLIC PANEL
ALBUMIN: 3 g/dL — AB (ref 3.5–5.0)
ALK PHOS: 70 U/L (ref 40–150)
ALT: 36 U/L (ref 0–55)
AST: 27 U/L (ref 5–34)
Anion Gap: 7 mEq/L (ref 3–11)
BILIRUBIN TOTAL: 0.29 mg/dL (ref 0.20–1.20)
BUN: 14.6 mg/dL (ref 7.0–26.0)
CO2: 26 mEq/L (ref 22–29)
CREATININE: 0.9 mg/dL (ref 0.7–1.3)
Calcium: 8.8 mg/dL (ref 8.4–10.4)
Chloride: 105 mEq/L (ref 98–109)
GLUCOSE: 111 mg/dL (ref 70–140)
Potassium: 4 mEq/L (ref 3.5–5.1)
SODIUM: 139 meq/L (ref 136–145)
TOTAL PROTEIN: 7.8 g/dL (ref 6.4–8.3)

## 2017-11-06 LAB — CBC WITH DIFFERENTIAL/PLATELET
BASO%: 0.4 % (ref 0.0–2.0)
BASOS ABS: 0 10*3/uL (ref 0.0–0.1)
EOS%: 9 % — AB (ref 0.0–7.0)
Eosinophils Absolute: 0.7 10*3/uL — ABNORMAL HIGH (ref 0.0–0.5)
HEMATOCRIT: 36.1 % — AB (ref 38.4–49.9)
HGB: 11.2 g/dL — ABNORMAL LOW (ref 13.0–17.1)
LYMPH#: 1 10*3/uL (ref 0.9–3.3)
LYMPH%: 13.9 % — AB (ref 14.0–49.0)
MCH: 30.9 pg (ref 27.2–33.4)
MCHC: 31 g/dL — AB (ref 32.0–36.0)
MCV: 99.4 fL — ABNORMAL HIGH (ref 79.3–98.0)
MONO#: 0.4 10*3/uL (ref 0.1–0.9)
MONO%: 5.1 % (ref 0.0–14.0)
NEUT#: 5.2 10*3/uL (ref 1.5–6.5)
NEUT%: 71.6 % (ref 39.0–75.0)
Platelets: 356 10*3/uL (ref 140–400)
RBC: 3.63 10*6/uL — AB (ref 4.20–5.82)
RDW: 16.5 % — ABNORMAL HIGH (ref 11.0–14.6)
WBC: 7.2 10*3/uL (ref 4.0–10.3)

## 2017-11-06 LAB — LACTATE DEHYDROGENASE: LDH: 368 U/L — ABNORMAL HIGH (ref 125–245)

## 2017-11-06 LAB — MAGNESIUM: Magnesium: 2.4 mg/dl (ref 1.5–2.5)

## 2017-11-06 MED ORDER — PEGFILGRASTIM 6 MG/0.6ML ~~LOC~~ PSKT
6.0000 mg | PREFILLED_SYRINGE | Freq: Once | SUBCUTANEOUS | Status: AC
  Administered 2017-11-06: 6 mg via SUBCUTANEOUS
  Filled 2017-11-06: qty 0.6

## 2017-11-06 MED ORDER — SODIUM CHLORIDE 0.9 % IV SOLN
750.0000 mg/m2 | Freq: Once | INTRAVENOUS | Status: AC
  Administered 2017-11-06: 1440 mg via INTRAVENOUS
  Filled 2017-11-06: qty 72

## 2017-11-06 MED ORDER — DIPHENHYDRAMINE HCL 25 MG PO CAPS
25.0000 mg | ORAL_CAPSULE | Freq: Once | ORAL | Status: AC
  Administered 2017-11-06: 25 mg via ORAL

## 2017-11-06 MED ORDER — FAMOTIDINE IN NACL 20-0.9 MG/50ML-% IV SOLN
INTRAVENOUS | Status: AC
  Filled 2017-11-06: qty 50

## 2017-11-06 MED ORDER — DOXORUBICIN HCL CHEMO IV INJECTION 2 MG/ML
50.0000 mg/m2 | Freq: Once | INTRAVENOUS | Status: AC
  Administered 2017-11-06: 96 mg via INTRAVENOUS
  Filled 2017-11-06: qty 48

## 2017-11-06 MED ORDER — ACETAMINOPHEN 325 MG PO TABS
ORAL_TABLET | ORAL | Status: AC
  Filled 2017-11-06: qty 2

## 2017-11-06 MED ORDER — VINCRISTINE SULFATE CHEMO INJECTION 1 MG/ML
2.0000 mg | Freq: Once | INTRAVENOUS | Status: AC
  Administered 2017-11-06: 2 mg via INTRAVENOUS
  Filled 2017-11-06: qty 2

## 2017-11-06 MED ORDER — PALONOSETRON HCL INJECTION 0.25 MG/5ML
INTRAVENOUS | Status: AC
  Filled 2017-11-06: qty 5

## 2017-11-06 MED ORDER — SODIUM CHLORIDE 0.9 % IV SOLN
375.0000 mg/m2 | Freq: Once | INTRAVENOUS | Status: AC
  Administered 2017-11-06: 700 mg via INTRAVENOUS
  Filled 2017-11-06: qty 50

## 2017-11-06 MED ORDER — DEXAMETHASONE SODIUM PHOSPHATE 10 MG/ML IJ SOLN
10.0000 mg | Freq: Once | INTRAMUSCULAR | Status: AC
  Administered 2017-11-06: 10 mg via INTRAVENOUS

## 2017-11-06 MED ORDER — DEXAMETHASONE SODIUM PHOSPHATE 10 MG/ML IJ SOLN
INTRAMUSCULAR | Status: AC
  Filled 2017-11-06: qty 1

## 2017-11-06 MED ORDER — DIPHENHYDRAMINE HCL 25 MG PO CAPS
ORAL_CAPSULE | ORAL | Status: AC
  Filled 2017-11-06: qty 1

## 2017-11-06 MED ORDER — SODIUM CHLORIDE 0.9% FLUSH
10.0000 mL | INTRAVENOUS | Status: DC | PRN
  Administered 2017-11-06: 10 mL
  Filled 2017-11-06: qty 10

## 2017-11-06 MED ORDER — PALONOSETRON HCL INJECTION 0.25 MG/5ML
0.2500 mg | Freq: Once | INTRAVENOUS | Status: AC
  Administered 2017-11-06: 0.25 mg via INTRAVENOUS

## 2017-11-06 MED ORDER — FAMOTIDINE IN NACL 20-0.9 MG/50ML-% IV SOLN
20.0000 mg | Freq: Once | INTRAVENOUS | Status: AC
  Administered 2017-11-06: 20 mg via INTRAVENOUS

## 2017-11-06 MED ORDER — HEPARIN SOD (PORK) LOCK FLUSH 100 UNIT/ML IV SOLN
500.0000 [IU] | Freq: Once | INTRAVENOUS | Status: AC | PRN
  Administered 2017-11-06: 500 [IU]
  Filled 2017-11-06: qty 5

## 2017-11-06 MED ORDER — ACETAMINOPHEN 325 MG PO TABS
650.0000 mg | ORAL_TABLET | Freq: Once | ORAL | Status: AC
  Administered 2017-11-06: 650 mg via ORAL

## 2017-11-06 MED ORDER — SODIUM CHLORIDE 0.9 % IV SOLN
Freq: Once | INTRAVENOUS | Status: AC
  Administered 2017-11-06: 10:00:00 via INTRAVENOUS

## 2017-11-06 NOTE — Patient Instructions (Signed)
[place] Discharge Instructions for Patients Receiving Chemotherapy  Today you received the following chemotherapy agents Rituxan, Adriamycin, Oncovin, Cytoxan  To help prevent nausea and vomiting after your treatment, we encourage you to take your nausea medication as directed   If you develop nausea and vomiting that is not controlled by your nausea medication, call the clinic.   BELOW ARE SYMPTOMS THAT SHOULD BE REPORTED IMMEDIATELY:  *FEVER GREATER THAN 100.5 F  *CHILLS WITH OR WITHOUT FEVER  NAUSEA AND VOMITING THAT IS NOT CONTROLLED WITH YOUR NAUSEA MEDICATION  *UNUSUAL SHORTNESS OF BREATH  *UNUSUAL BRUISING OR BLEEDING  TENDERNESS IN MOUTH AND THROAT WITH OR WITHOUT PRESENCE OF ULCERS  *URINARY PROBLEMS  *BOWEL PROBLEMS  UNUSUAL RASH Items with * indicate a potential emergency and should be followed up as soon as possible.  Feel free to call the clinic should you have any questions or concerns. The clinic phone number is [phone].  Please show the [place] at check-in to the Emergency Department and triage nurse.

## 2017-11-06 NOTE — Telephone Encounter (Signed)
Gave avs and calendar for December  °

## 2017-11-06 NOTE — Progress Notes (Signed)
Blood return noted before, every 3 cc and after Adriamycin push. Blood return noted before and after Vincristine infusion.  Neulasta video, printed and verbal discharge instructions provided to pt. Pt verbalizes understanding.

## 2017-11-06 NOTE — Progress Notes (Signed)
Oncology Nurse Navigator Documentation  Met with Mr. Riera in Infusion.  His wife was chairside. He reported:  Feeling better s/p PNA of recent weeks.  Expecting another cycle of chemotherapy followed by scan then RT.  Denied needs at this time. They were encouraged to contact me with needs/concerns as tmts continue.  Gayleen Orem, RN, BSN, [place] [facility] Oncology Nurse [place] at [place] [phone]

## 2017-11-07 LAB — PHOSPHORUS: PHOSPHORUS: 3 mg/dL (ref 2.5–4.5)

## 2017-11-09 ENCOUNTER — Encounter: Payer: Self-pay | Admitting: *Deleted

## 2017-11-09 NOTE — Progress Notes (Signed)
Oncology Nurse Navigator Documentation  Patient file review/update, Navigator Spreadsheet update.  Rick Tahara Ruffini, RN, BSN, [facility] Oncology Nurse Navigator [facility] at [place] [phone]   

## 2017-11-11 NOTE — Progress Notes (Signed)
[facility] Follow-up Visit:  Assessment: Diffuse large B-cell lymphoma of lymph nodes of neck ([place]) 71 y.o. male with diagnosis of activated B-cell diffuse large B-cell lymphoma of the lymph nodes of the right side of the neck. Currently available staging information suggests presence of stage II bulky disease. Based on the diagnosis, patient was started on R-CHOP chemotherapy. There has been significant reduction in the bulk of the lymphoma by examination. Interval PET/CT demonstrates strong partial response, although we still see an area of increased FDG uptake in the main disease focus.  On presentation today, patient's symptoms are worrisome for possible pulmonary infection including pneumonia.  Will assess with CT of the chest and echocardiogram due to risk of anthracycline-induced cardiotoxicity.  If pneumonia is found, will treat with empiric broad spectrum antibiotics without admitting to the hospital as the patient's neutrophil count is currently normal.  We will not proceed with chemotherapy today to allow for recovery from infection.  Plan: --CT Chest --ECHO --Antibiotics if pnemonia --RTC 1 week for possible resumption of chemotherapy if symptoms resolve --Repeat PET/CT after completion of 6 cycles of therapy.   Voice recognition software was used and creation of this note. Despite my best effort at editing the text, some misspelling/errors may have occurred.  Orders Placed This Encounter  Procedures  . CT Chest Wo Contrast    Standing Status:   Future    Number of Occurrences:   1    Standing Expiration Date:   10/23/2018    Order Specific Question:   Preferred imaging location?    Answer:   [facility]    Order Specific Question:   Radiology Contrast Protocol - do NOT remove file path    Answer:   [email or web address]    Order Specific Question:   Reason for Exam additional comments    Answer:   new increased WOB,  please eval PNA vs fluid overload  . CBC with Differential    Standing Status:   Future    Number of Occurrences:   1    Standing Expiration Date:   10/23/2018  . Comprehensive metabolic panel    Standing Status:   Future    Number of Occurrences:   1    Standing Expiration Date:   10/23/2018  . Lactate dehydrogenase (LDH)    Standing Status:   Future    Number of Occurrences:   1    Standing Expiration Date:   10/23/2018  . Magnesium    Standing Status:   Future    Number of Occurrences:   1    Standing Expiration Date:   10/23/2018  . Phosphorus    Standing Status:   Future    Number of Occurrences:   1    Standing Expiration Date:   10/23/2018  . ECHOCARDIOGRAM LIMITED    Standing Status:   Future    Number of Occurrences:   1    Standing Expiration Date:   01/23/2019    Scheduling Instructions:     Please eval LVEF change while the patient is on adriamycin with increased WOB    Order Specific Question:   Where should this test be performed    Answer:   [facility]    Order Specific Question:   Perflutren DEFINITY (image enhancing agent) should be administered unless hypersensitivity or allergy exist    Answer:   Administer Perflutren    Order Specific Question:   Expected Date:    Answer:  ASAP    Cancer Staging Diffuse large B-cell lymphoma of lymph nodes of neck (HCC) Staging form: Hodgkin and Non-Hodgkin Lymphoma, AJCC 8th Edition - Clinical stage from 07/13/2017: Stage II bulky (Diffuse large B-cell lymphoma) - Signed by Ardath Sax, MD on 07/20/2017   All questions were answered.  . The patient knows to call the clinic with any problems, questions or concerns.  This note was electronically signed.    History of Presenting Illness Todd Bell is a  71 y.o. [place] male follwed in the [place] for diagnosis of diffuse large B-cell lymphoma.  Please see oncologic history below for details of the diagnosis. Patient has initially discovered  asymmetry in his neck with visible mass earlier in July. He was seen by his primary care provider and subsequently directly to be evaluated by ENT. Patient underwent initially ultrasound of the neck demonstrating a large mass, subsequently he underwent CT of the neck on 06/15/17 demonstrating normal laryngeal, pharyngeal, salivary structures and a 5.0 cm supraclavicular mass on the right side. CT of the chest obtained the same time demonstrated mass, but no lymphadenopathy in the mediastinum, hilar structures, and a 0.4 cm right upper lobe nodule and a 0.5 cm left upper lobe nodule. Patient underwent an FNA on 06/17/17 which demonstrated presence of malignant cells that could not be further characterized due to insufficient sample. Initially, patient denied any significant symptoms in association with this mass. He denied any fevers, chills, night sweats. No significant weight loss. No changes to his activities of daily living or activity tolerance. Denied changes to his appetite. Denied chest pain, shortness of breath or cough. Mr Poss had no nausea, early satiety, abdominal pain, diarrhea, or constipation. No dysuria or hematuria. Denied any cutaneous changes. Denied any vision changes, hearing changes, swallowing difficulties, any focal weakness or sensory deficits in the face or extremities.  Patient is currently undergoing curative-intent chemotherapy with R-CHOP. He returns to the clinic for possible administration of the 5th cycle of therapy.  In the interim, patient had an admission to the hospital due to febrile episode without neutropenia.  Patient was discharged promptly after admission feeling quite well.  Over the past several days, daughter and wife noticed patient being more confused, less active, and breathing harder.  Patient initially denies the symptoms and reports feeling just fine, but when initially questioned, he acknowledges that he has been feeling more tired.  He also reports  nonproductive cough and chest congestion, denies any significant fever at home.   Oncological/hematological History:   Diffuse large B-cell lymphoma of lymph nodes of neck ([place])   06/15/2017 Imaging    CT Neck/Chest: Normal laryngeal, pharyngeal, salivary structures and a 5.0 cm supraclavicular mass on the right side.No lymphadenopathy in the mediastinum, hilar structures, and a 0.4 cm right upper lobe nodule and a 0.5 cm left upper lobe nodule.       06/30/2017 Imaging    PET-CT: Highly hypermetabolic right conglomerate supraclavicular mass. This is accompanied by accentuated metabolic activity in the palatine tonsillar pillars, a hypermetabolic right buccinator lymph node, hypermetabolic thoracic lymph nodes including a mildly enlarged AP window lymph node and other upper normal size but mildly hypermetabolic hilar and infrahilar nodes. Appearance compatible with malignancy such as lymphoma or adenocarcinoma      07/13/2017 Pathology Results    Rt Neck mass core bx: Sheets of large lymphocytes discernible lymph node structure, consistent with diffuse large B-cell lymphoma. IHC -- positive for LCA, CD20, CD79a, PAX-5, BCL-6 &  negative for CD10, CD34, BCL-2, CD30, CD138, kappa or lambda light chains      07/17/2017 Initial Diagnosis    Diffuse large B-cell lymphoma of lymph nodes of neck ([place])      07/29/2017 Procedure    Placement of left IJ power-injectable port catheter.       07/31/2017 -  Chemotherapy    R-CHOP21: Rituximab 358m/m2, d1 + Cyclophosphamide 7572mm2, d1 + Doxorubicin 5039m2, d1 + Vincristine 2mg4m1 + Prednisone 60mg70mQDay, d1-5 Q21 d x6 cycles planned --Cycle #1, 07/31/17: --Cycle #2, 08/21/17: --Cycle #3, 09/11/17:  --Cycle #4, 10/02/17: --Cycle #5, ...: delayed due to pneumonia       09/03/2017 Imaging    PET-CT: The index nodal mass in the right supraclavicular region measures 3.5 cm and has an SUV max equal to 6.33 (Deauville Criteria 4). On the previous exam  this measured 7.7 cm and had an SUV max equal to 29. The right buccinator node measures 6 mm and has an SUV max equal to 3.13 (Deauville Criteria 3). Previously 7 mm with an SUV max equal to 10.1. Mild increased FDG uptake associated with mediastinal and bilateral hilar regions. The index AP window lymph node measures 11 mm and has an SUV max equal to 3.93. (Deauville Criteria 4). Previously this measured 1.4 cm and had an SUV max of 4.9.       Medical History: Past Medical History:  Diagnosis Date  . Cancer (HCC) [place] Medical history non-contributory   . Non Hodgkin's lymphoma (HCC)[facility] Surgical History: Past Surgical History:  Procedure Laterality Date  . CATARACT EXTRACTION    . COLONOSCOPY  02/2016  . IR FLUORO GUIDE PORT INSERTION LEFT  07/29/2017  . IR US [place] VASC ACCESS LEFT  07/29/2017  . LYMPH NODE BIOPSY Right 07/13/2017   Procedure: OPEN NECK LYMPH NODE BIOPSY RIGHT SIDE;  Surgeon: RosenIzora Gala  [facility];  Laterality: Right;    Family History: Family History  Problem Relation Age of Onset  . Cancer Father        Pancreatic    Social History: Social History   Socioeconomic History  . Marital status: Married    Spouse name: Not on file  . Number of children: Not on file  . Years of education: Not on file  . Highest education level: Not on file  Social Needs  . Financial resource strain: Not on file  . Food insecurity - worry: Not on file  . Food insecurity - inability: Not on file  . Transportation needs - medical: Not on file  . Transportation needs - non-medical: Not on file  Occupational History  . Not on file  Tobacco Use  . Smoking status: Former Smoker    Last attempt to quit: 1988    Years since quitting: 30.9  . Smokeless tobacco: Never Used  . Tobacco comment: he was a social drinker  Substance and Sexual Activity  . Alcohol use: Yes    Comment: occasional  . Drug use: No  . Sexual activity: Not  on file  Other Topics Concern  . Not on file  Social History Narrative  . Not on file    Allergies: No Known Allergies  Medications:  Current Outpatient Medications  Medication Sig Dispense Refill  . acetaminophen (TYLENOL) 325 MG tablet Take 2 tablets (650 mg total) by mouth every 6 (six) hours as needed for mild pain (or Fever >/=  101). 30 tablet 0  . Dextromethorphan-Guaifenesin (ROBITUSSIN SUGAR FREE) 10-100 MG/5ML liquid Take 20 mLs by mouth 2 (two) times daily as needed (congestion and cough).    Marland Kitchen ibuprofen (ADVIL,MOTRIN) 200 MG tablet Take 400 mg by mouth daily as needed for fever or moderate pain.    Marland Kitchen lidocaine-prilocaine (EMLA) cream Apply 1 application topically as needed. 30 g 6  . ondansetron (ZOFRAN) 8 MG tablet Take 1 tablet (8 mg total) by mouth 2 (two) times daily as needed for refractory nausea / vomiting. Start on day 3 after cyclophosphamide. 30 tablet 1  . prochlorperazine (COMPAZINE) 10 MG tablet Take 1 tablet (10 mg total) by mouth every 6 (six) hours as needed (Nausea or vomiting). 30 tablet 6   No current facility-administered medications for this visit.     Review of Systems: Review of Systems  Respiratory: Positive for shortness of breath.   Psychiatric/Behavioral: Positive for confusion.  All other systems reviewed and are negative.    PHYSICAL EXAMINATION Blood pressure 124/67, pulse (!) 110, temperature 97.8 F (36.6 C), temperature source Oral, resp. rate 16, height 5' 7"  (1.702 m), weight 171 lb 14.4 oz (78 kg), SpO2 96 %.  ECOG PERFORMANCE STATUS: 3 - Symptomatic, >50% confined to bed  Physical Exam  Constitutional: He is oriented to person, place, and time and well-developed, well-nourished, and in no distress. Vital signs are normal. He appears not malnourished, not dehydrated and not jaundiced. He does not have a sickly appearance. No distress.  HENT:  Mouth/Throat: Uvula is midline, oropharynx is clear and moist and mucous membranes are  normal. Mucous membranes are not pale, not dry and not cyanotic. He has dentures. Normal dentition.  Eyes: Conjunctivae and EOM are normal. Pupils are equal, round, and reactive to light. No scleral icterus.  Neck: No JVD present. Carotid bruit is not present.  Cardiovascular: Normal rate, regular rhythm and normal heart sounds.  No murmur heard. Pulmonary/Chest: He has no decreased breath sounds. He has no rhonchi.  Increased work of breathing.  Decreased breath sounds in the right lower lobe.  No expiratory wheezing no dullness to percussion  Abdominal: Soft. Normal appearance and normal aorta. There is no hepatosplenomegaly. There is no tenderness.  Lymphadenopathy:       Head (right side): No submental, no submandibular, no tonsillar and no occipital adenopathy present.       Head (left side): No submental, no submandibular, no tonsillar and no occipital adenopathy present.    He has no cervical adenopathy.       Right cervical: No posterior cervical adenopathy present.      Left cervical: No superficial cervical, no deep cervical and no posterior cervical adenopathy present.    He has no axillary adenopathy.       Right: No inguinal and no supraclavicular adenopathy present.       Left: No inguinal and no supraclavicular adenopathy present.  Complete resolution of the previously palpable bulky mass in the right lower neck/supraclavicular fossa.  Neurological: He is alert and oriented to person, place, and time. He has normal sensation, normal strength, normal reflexes and intact cranial nerves.  Skin: He is not diaphoretic.     LABORATORY DATA: I have personally reviewed the data as listed: Appointment on 10/23/2017  Component Date Value Ref Range Status  . Phosphorus, Ser 10/23/2017 2.8  2.5 - 4.5 mg/dL Final  . Magnesium 10/23/2017 2.2  1.5 - 2.5 mg/dl Final  . LDH 10/23/2017 363* 125 - 245 U/L  Final  . Sodium 10/23/2017 136  136 - 145 mEq/L Final  . Potassium 10/23/2017 3.7  3.5  - 5.1 mEq/L Final  . Chloride 10/23/2017 102  98 - 109 mEq/L Final  . CO2 10/23/2017 24  22 - 29 mEq/L Final  . Glucose 10/23/2017 120  70 - 140 mg/dl Final   Glucose reference range is for nonfasting patients. Fasting glucose reference range is 70- 100.  Marland Kitchen BUN 10/23/2017 12.5  7.0 - 26.0 mg/dL Final  . Creatinine 10/23/2017 0.8  0.7 - 1.3 mg/dL Final  . Total Bilirubin 10/23/2017 0.43  0.20 - 1.20 mg/dL Final  . Alkaline Phosphatase 10/23/2017 82  40 - 150 U/L Final  . AST 10/23/2017 40* 5 - 34 U/L Final  . ALT 10/23/2017 55  0 - 55 U/L Final  . Total Protein 10/23/2017 7.2  6.4 - 8.3 g/dL Final  . Albumin 10/23/2017 2.9* 3.5 - 5.0 g/dL Final  . Calcium 10/23/2017 8.9  8.4 - 10.4 mg/dL Final  . Anion Gap 10/23/2017 10  3 - 11 mEq/L Final  . EGFR 10/23/2017 >60  >60 ml/min/1.73 m2 Final   eGFR is calculated using the CKD-EPI Creatinine Equation (2009)  . WBC 10/23/2017 22.9* 4.0 - 10.3 10e3/uL Final  . NEUT# 10/23/2017 19.7* 1.5 - 6.5 10e3/uL Final  . HGB 10/23/2017 11.0* 13.0 - 17.1 g/dL Final  . HCT 10/23/2017 34.0* 38.4 - 49.9 % Final  . Platelets 10/23/2017 411* 140 - 400 10e3/uL Final  . MCV 10/23/2017 96.0  79.3 - 98.0 fL Final  . MCH 10/23/2017 31.1  27.2 - 33.4 pg Final  . MCHC 10/23/2017 32.4  32.0 - 36.0 g/dL Final  . RBC 10/23/2017 3.54* 4.20 - 5.82 10e6/uL Final  . RDW 10/23/2017 15.4* 11.0 - 14.6 % Final  . lymph# 10/23/2017 1.7  0.9 - 3.3 10e3/uL Final  . MONO# 10/23/2017 1.4* 0.1 - 0.9 10e3/uL Final  . Eosinophils Absolute 10/23/2017 0.1  0.0 - 0.5 10e3/uL Final  . Basophils Absolute 10/23/2017 0.1  0.0 - 0.1 10e3/uL Final  . NEUT% 10/23/2017 86.0* 39.0 - 75.0 % Final  . LYMPH% 10/23/2017 7.2* 14.0 - 49.0 % Final  . MONO% 10/23/2017 6.3  0.0 - 14.0 % Final  . EOS% 10/23/2017 0.3  0.0 - 7.0 % Final  . BASO% 10/23/2017 0.2  0.0 - 2.0 % Final       Ardath Sax, MD

## 2017-11-11 NOTE — Assessment & Plan Note (Addendum)
71 y.o. male with diagnosis of activated B-cell diffuse large B-cell lymphoma of the lymph nodes of the right side of the neck. Currently available staging information suggests presence of stage II bulky disease. Based on the diagnosis, patient was started on R-CHOP chemotherapy. There has been significant reduction in the bulk of the lymphoma by examination. Interval PET/CT demonstrates strong partial response, although we still see an area of increased FDG uptake in the main disease focus.  On presentation today, patient's symptoms are worrisome for possible pulmonary infection including pneumonia.  Will assess with CT of the chest and echocardiogram due to risk of anthracycline-induced cardiotoxicity.  If pneumonia is found, will treat with empiric broad spectrum antibiotics without admitting to the hospital as the patient's neutrophil count is currently normal.  We will not proceed with chemotherapy today to allow for recovery from infection.  Plan: --CT Chest --ECHO --Antibiotics if pnemonia --RTC 1 week for possible resumption of chemotherapy if symptoms resolve --Repeat PET/CT after completion of 6 cycles of therapy.

## 2017-11-27 ENCOUNTER — Other Ambulatory Visit (HOSPITAL_BASED_OUTPATIENT_CLINIC_OR_DEPARTMENT_OTHER): Payer: Medicare Other

## 2017-11-27 ENCOUNTER — Encounter: Payer: Self-pay | Admitting: Hematology

## 2017-11-27 ENCOUNTER — Ambulatory Visit (HOSPITAL_BASED_OUTPATIENT_CLINIC_OR_DEPARTMENT_OTHER): Payer: Medicare Other

## 2017-11-27 ENCOUNTER — Ambulatory Visit (HOSPITAL_BASED_OUTPATIENT_CLINIC_OR_DEPARTMENT_OTHER): Payer: Medicare Other | Admitting: Hematology

## 2017-11-27 VITALS — BP 116/73 | HR 98 | Temp 97.6°F | Resp 18 | Ht 67.0 in | Wt 161.4 lb

## 2017-11-27 VITALS — BP 106/62 | HR 87 | Temp 98.0°F | Resp 18

## 2017-11-27 DIAGNOSIS — D649 Anemia, unspecified: Secondary | ICD-10-CM

## 2017-11-27 DIAGNOSIS — C8331 Diffuse large B-cell lymphoma, lymph nodes of head, face, and neck: Secondary | ICD-10-CM

## 2017-11-27 DIAGNOSIS — Z5111 Encounter for antineoplastic chemotherapy: Secondary | ICD-10-CM

## 2017-11-27 DIAGNOSIS — R530 Neoplastic (malignant) related fatigue: Secondary | ICD-10-CM | POA: Diagnosis not present

## 2017-11-27 DIAGNOSIS — Z5189 Encounter for other specified aftercare: Secondary | ICD-10-CM | POA: Diagnosis not present

## 2017-11-27 DIAGNOSIS — Z5112 Encounter for antineoplastic immunotherapy: Secondary | ICD-10-CM

## 2017-11-27 DIAGNOSIS — D6481 Anemia due to antineoplastic chemotherapy: Secondary | ICD-10-CM | POA: Diagnosis not present

## 2017-11-27 LAB — COMPREHENSIVE METABOLIC PANEL
ALBUMIN: 3.4 g/dL — AB (ref 3.5–5.0)
ALK PHOS: 75 U/L (ref 40–150)
ALT: 21 U/L (ref 0–55)
AST: 23 U/L (ref 5–34)
Anion Gap: 9 mEq/L (ref 3–11)
BUN: 17.8 mg/dL (ref 7.0–26.0)
CO2: 24 mEq/L (ref 22–29)
Calcium: 8.8 mg/dL (ref 8.4–10.4)
Chloride: 108 mEq/L (ref 98–109)
Creatinine: 0.9 mg/dL (ref 0.7–1.3)
EGFR: >60 mL/min/{1.73_m2} (ref >60)
GLUCOSE: 102 mg/dL (ref 70–140)
POTASSIUM: 4.1 meq/L (ref 3.5–5.1)
SODIUM: 141 meq/L (ref 136–145)
Total Bilirubin: 0.34 mg/dL (ref 0.20–1.20)
Total Protein: 7.1 g/dL (ref 6.4–8.3)

## 2017-11-27 LAB — CBC WITH DIFFERENTIAL/PLATELET
BASO%: 0.9 % (ref 0.0–2.0)
BASOS ABS: 0.1 10*3/uL (ref 0.0–0.1)
EOS%: 1 % (ref 0.0–7.0)
Eosinophils Absolute: 0.1 10*3/uL (ref 0.0–0.5)
HCT: 34 % — ABNORMAL LOW (ref 38.4–49.9)
HEMOGLOBIN: 11 g/dL — AB (ref 13.0–17.1)
LYMPH%: 11.6 % — ABNORMAL LOW (ref 14.0–49.0)
MCH: 31.3 pg (ref 27.2–33.4)
MCHC: 32.5 g/dL (ref 32.0–36.0)
MCV: 96.2 fL (ref 79.3–98.0)
MONO#: 1.2 10*3/uL — ABNORMAL HIGH (ref 0.1–0.9)
MONO%: 18.2 % — AB (ref 0.0–14.0)
NEUT#: 4.4 10*3/uL (ref 1.5–6.5)
NEUT%: 68.3 % (ref 39.0–75.0)
Platelets: 306 10*3/uL (ref 140–400)
RBC: 3.53 10*6/uL — ABNORMAL LOW (ref 4.20–5.82)
RDW: 18.5 % — AB (ref 11.0–14.6)
WBC: 6.4 10*3/uL (ref 4.0–10.3)
lymph#: 0.7 10*3/uL — ABNORMAL LOW (ref 0.9–3.3)

## 2017-11-27 LAB — URIC ACID: URIC ACID, SERUM: 5.3 mg/dL (ref 2.6–7.4)

## 2017-11-27 LAB — MAGNESIUM: MAGNESIUM: 2.2 mg/dL (ref 1.5–2.5)

## 2017-11-27 LAB — LACTATE DEHYDROGENASE: LDH: 307 U/L — ABNORMAL HIGH (ref 125–245)

## 2017-11-27 MED ORDER — DEXAMETHASONE SODIUM PHOSPHATE 10 MG/ML IJ SOLN
INTRAMUSCULAR | Status: AC
  Filled 2017-11-27: qty 1

## 2017-11-27 MED ORDER — SODIUM CHLORIDE 0.9 % IV SOLN
2.0000 mg | Freq: Once | INTRAVENOUS | Status: AC
  Administered 2017-11-27: 2 mg via INTRAVENOUS
  Filled 2017-11-27: qty 2

## 2017-11-27 MED ORDER — HEPARIN SOD (PORK) LOCK FLUSH 100 UNIT/ML IV SOLN
500.0000 [IU] | Freq: Once | INTRAVENOUS | Status: AC | PRN
  Administered 2017-11-27: 500 [IU]
  Filled 2017-11-27: qty 5

## 2017-11-27 MED ORDER — PEGFILGRASTIM 6 MG/0.6ML ~~LOC~~ PSKT
PREFILLED_SYRINGE | SUBCUTANEOUS | Status: AC
  Filled 2017-11-27: qty 0.6

## 2017-11-27 MED ORDER — DIPHENHYDRAMINE HCL 25 MG PO CAPS
ORAL_CAPSULE | ORAL | Status: AC
  Filled 2017-11-27: qty 1

## 2017-11-27 MED ORDER — ACETAMINOPHEN 325 MG PO TABS
650.0000 mg | ORAL_TABLET | Freq: Once | ORAL | Status: AC
  Administered 2017-11-27: 650 mg via ORAL

## 2017-11-27 MED ORDER — SODIUM CHLORIDE 0.9 % IV SOLN
Freq: Once | INTRAVENOUS | Status: AC
  Administered 2017-11-27: 11:00:00 via INTRAVENOUS

## 2017-11-27 MED ORDER — PALONOSETRON HCL INJECTION 0.25 MG/5ML
0.2500 mg | Freq: Once | INTRAVENOUS | Status: AC
  Administered 2017-11-27: 0.25 mg via INTRAVENOUS

## 2017-11-27 MED ORDER — SODIUM CHLORIDE 0.9 % IV SOLN
375.0000 mg/m2 | Freq: Once | INTRAVENOUS | Status: AC
  Administered 2017-11-27: 700 mg via INTRAVENOUS
  Filled 2017-11-27: qty 20

## 2017-11-27 MED ORDER — FAMOTIDINE IN NACL 20-0.9 MG/50ML-% IV SOLN
INTRAVENOUS | Status: AC
  Filled 2017-11-27: qty 50

## 2017-11-27 MED ORDER — ACETAMINOPHEN 325 MG PO TABS
ORAL_TABLET | ORAL | Status: AC
  Filled 2017-11-27: qty 2

## 2017-11-27 MED ORDER — DOXORUBICIN HCL CHEMO IV INJECTION 2 MG/ML
50.0000 mg/m2 | Freq: Once | INTRAVENOUS | Status: AC
  Administered 2017-11-27: 96 mg via INTRAVENOUS
  Filled 2017-11-27: qty 48

## 2017-11-27 MED ORDER — FAMOTIDINE IN NACL 20-0.9 MG/50ML-% IV SOLN
20.0000 mg | Freq: Once | INTRAVENOUS | Status: AC
  Administered 2017-11-27: 20 mg via INTRAVENOUS

## 2017-11-27 MED ORDER — PALONOSETRON HCL INJECTION 0.25 MG/5ML
INTRAVENOUS | Status: AC
  Filled 2017-11-27: qty 5

## 2017-11-27 MED ORDER — CYCLOPHOSPHAMIDE CHEMO INJECTION 1 GM
750.0000 mg/m2 | Freq: Once | INTRAMUSCULAR | Status: AC
  Administered 2017-11-27: 1440 mg via INTRAVENOUS
  Filled 2017-11-27: qty 72

## 2017-11-27 MED ORDER — SODIUM CHLORIDE 0.9% FLUSH
10.0000 mL | INTRAVENOUS | Status: DC | PRN
  Administered 2017-11-27: 10 mL
  Filled 2017-11-27: qty 10

## 2017-11-27 MED ORDER — DEXAMETHASONE SODIUM PHOSPHATE 10 MG/ML IJ SOLN
10.0000 mg | Freq: Once | INTRAMUSCULAR | Status: AC
  Administered 2017-11-27: 10 mg via INTRAVENOUS

## 2017-11-27 MED ORDER — DIPHENHYDRAMINE HCL 25 MG PO CAPS
25.0000 mg | ORAL_CAPSULE | Freq: Once | ORAL | Status: AC
  Administered 2017-11-27: 25 mg via ORAL

## 2017-11-27 MED ORDER — PEGFILGRASTIM 6 MG/0.6ML ~~LOC~~ PSKT
6.0000 mg | PREFILLED_SYRINGE | Freq: Once | SUBCUTANEOUS | Status: AC
  Administered 2017-11-27: 6 mg via SUBCUTANEOUS

## 2017-11-27 NOTE — Patient Instructions (Signed)
Thank you for choosing [facility] to provide your oncology and hematology care.  To afford each patient quality time with our providers, please arrive 30 minutes before your scheduled appointment time.  If you arrive late for your appointment, you may be asked to reschedule.  We strive to give you quality time with our providers, and arriving late affects you and other patients whose appointments are after yours.   If you are a no show for multiple scheduled visits, you may be dismissed from the clinic at the providers discretion.    Again, thank you for choosing [facility], our hope is that these requests will decrease the amount of time that you wait before being seen by our physicians.  ______________________________________________________________________  Should you have questions after your visit to the [facility], please contact our office at [phone] between the hours of 8:30 and 4:30 p.m.    Voicemails left after 4:30p.m will not be returned until the following business day.    For prescription refill requests, please have your pharmacy contact us directly.  Please also try to allow 48 hours for prescription requests.    Please contact the scheduling department for questions regarding scheduling.  For scheduling of procedures such as PET scans, CT scans, MRI, Ultrasound, etc please contact central scheduling at [phone].    Resources For Cancer Patients and Caregivers:   Oncolink.org:  A wonderful resource for patients and healthcare providers for information regarding your disease, ways to tract your treatment, what to expect, etc.     American Cancer Society:  800-227-2345  Can help patients locate various types of support and financial assistance  Cancer Care: 1-800-813-HOPE (4673) Provides financial assistance, online support groups, medication/co-pay assistance.    [place] DSS:  336-641-3447 Where to apply for food  stamps, Medicaid, and utility assistance  Medicare Rights Center: 800-333-4114 Helps people with Medicare understand their rights and benefits, navigate the Medicare system, and secure the quality healthcare they deserve  SCAT: 336-333-6589 [place] [employer]'s shared-ride transportation service for eligible riders who have a disability that prevents them from riding the fixed route bus.    For additional information on assistance programs please contact our social worker:   Grier Hock/Abigail Elmore:  [phone]            

## 2017-11-27 NOTE — Patient Instructions (Signed)
[facility] Discharge Instructions for Patients Receiving Chemotherapy  Today you received the following chemotherapy agents:  Adriamycin (doxorubicin), Vincristine (oncovorin), Cytoxan (cyclophosphamide), Rituxan (rituximab)  To help prevent nausea and vomiting after your treatment, we encourage you to take your nausea medication as prescribed.   If you develop nausea and vomiting that is not controlled by your nausea medication, call the clinic.   BELOW ARE SYMPTOMS THAT SHOULD BE REPORTED IMMEDIATELY:  *FEVER GREATER THAN 100.5 F  *CHILLS WITH OR WITHOUT FEVER  NAUSEA AND VOMITING THAT IS NOT CONTROLLED WITH YOUR NAUSEA MEDICATION  *UNUSUAL SHORTNESS OF BREATH  *UNUSUAL BRUISING OR BLEEDING  TENDERNESS IN MOUTH AND THROAT WITH OR WITHOUT PRESENCE OF ULCERS  *URINARY PROBLEMS  *BOWEL PROBLEMS  UNUSUAL RASH Items with * indicate a potential emergency and should be followed up as soon as possible.  Feel free to call the clinic should you have any questions or concerns. The clinic phone number is [phone].  Please show the [place] at check-in to the Emergency Department and triage nurse.

## 2017-11-28 LAB — PHOSPHORUS: Phosphorus, Ser: 3.5 mg/dL (ref 2.5–4.5)

## 2017-12-02 ENCOUNTER — Telehealth: Payer: Self-pay | Admitting: Hematology and Oncology

## 2017-12-02 NOTE — Telephone Encounter (Signed)
Spoke to patients wife regarding upcoming February appointments.  °

## 2017-12-03 NOTE — Progress Notes (Signed)
Marland Kitchen  HEMATOLOGY ONCOLOGY PROGRESS NOTE  Date of service: .11/27/2017  Patient Care Team: Lujean Amel, MD as PCP - General (Family Medicine) Ardath Sax, MD as Consulting Physician (Hematology and Oncology) Eppie Gibson, MD as Attending Physician (Radiation Oncology) Leota Sauers, RN as Oncology Nurse Navigator  Diagnosis: Diffuse large B cell lymphoma  Current Treatment: R-CHOP s/p 5 cycles with G-CSF support  SUMMARY OF ONCOLOGIC HISTORY:   Diffuse large B-cell lymphoma of lymph nodes of neck ([place])   06/15/2017 Imaging    CT Neck/Chest: Normal laryngeal, pharyngeal, salivary structures and a 5.0 cm supraclavicular mass on the right side.No lymphadenopathy in the mediastinum, hilar structures, and a 0.4 cm right upper lobe nodule and a 0.5 cm left upper lobe nodule.       06/30/2017 Imaging    PET-CT: Highly hypermetabolic right conglomerate supraclavicular mass. This is accompanied by accentuated metabolic activity in the palatine tonsillar pillars, a hypermetabolic right buccinator lymph node, hypermetabolic thoracic lymph nodes including a mildly enlarged AP window lymph node and other upper normal size but mildly hypermetabolic hilar and infrahilar nodes. Appearance compatible with malignancy such as lymphoma or adenocarcinoma      07/13/2017 Pathology Results    Rt Neck mass core bx: Sheets of large lymphocytes discernible lymph node structure, consistent with diffuse large B-cell lymphoma. IHC -- positive for LCA, CD20, CD79a, PAX-5, BCL-6 & negative for CD10, CD34, BCL-2, CD30, CD138, kappa or lambda light chains      07/17/2017 Initial Diagnosis    Diffuse large B-cell lymphoma of lymph nodes of neck ([place])      07/29/2017 Procedure    Placement of left IJ power-injectable port catheter.       07/31/2017 -  Chemotherapy    R-CHOP21: Rituximab 375mg /m2, d1 + Cyclophosphamide 750mg /m2, d1 + Doxorubicin 50mg /m2, d1 + Vincristine 2mg , d1 + Prednisone 60mg  PO QDay, d1-5  Q21 d x6 cycles planned --Cycle #1, 07/31/17: --Cycle #2, 08/21/17: --Cycle #3, 09/11/17:  --Cycle #4, 10/02/17: --Cycle #5, ...: delayed due to pneumonia       09/03/2017 Imaging    PET-CT: The index nodal mass in the right supraclavicular region measures 3.5 cm and has an SUV max equal to 6.33 (Deauville Criteria 4). On the previous exam this measured 7.7 cm and had an SUV max equal to 29. The right buccinator node measures 6 mm and has an SUV max equal to 3.13 (Deauville Criteria 3). Previously 7 mm with an SUV max equal to 10.1. Mild increased FDG uptake associated with mediastinal and bilateral hilar regions. The index AP window lymph node measures 11 mm and has an SUV max equal to 3.93. (Deauville Criteria 4). Previously this measured 1.4 cm and had an SUV max of 4.9.       INTERVAL HISTORY:  Mr Bozzo Is here for follow-up prior to his 6th cycle of R CHOP chemotherapy for diffuse large B-cell lymphoma. He is a patient of Dr. Lebron Conners.  He recently had pneumonia requiring treatment with antibiotics and delay in his 5th cycle of R-CHOP. He has since received his 5th cycle of R-CHOP and is here for consideration of 6th cycle of R-CHOP. He notes no acute new symptoms. Breathing is at baseline. No cough/chest pain. No fevers/chills/night sweats. He is looking forward to travel to [place] after completion of treatment.   REVIEW OF SYSTEMS:    10 Point review of systems of done and is negative except as noted above.  . Past Medical History:  Diagnosis Date  . Cancer ([place])   . Medical history non-contributory   . Non Hodgkin's lymphoma ([place])     . Past Surgical History:  Procedure Laterality Date  . CATARACT EXTRACTION    . COLONOSCOPY  02/2016  . IR FLUORO GUIDE PORT INSERTION LEFT  07/29/2017  . IR US GUIDE VASC ACCESS LEFT  07/29/2017  . LYMPH NODE BIOPSY Right 07/13/2017   Procedure: OPEN NECK LYMPH NODE BIOPSY RIGHT SIDE;  Surgeon: Izora Gala, MD;  Location: [facility];  Service: ENT;  Laterality: Right;    . Social History   Tobacco Use  . Smoking status: Former Smoker    Last attempt to quit: 1988    Years since quitting: 31.0  . Smokeless tobacco: Never Used  . Tobacco comment: he was a social drinker  Substance Use Topics  . Alcohol use: Yes    Comment: occasional  . Drug use: No    ALLERGIES:  has No Known Allergies.  MEDICATIONS:  Current Outpatient Medications  Medication Sig Dispense Refill  . acetaminophen (TYLENOL) 325 MG tablet Take 2 tablets (650 mg total) by mouth every 6 (six) hours as needed for mild pain (or Fever >/= 101). 30 tablet 0  . Dextromethorphan-Guaifenesin (ROBITUSSIN SUGAR FREE) 10-100 MG/5ML liquid Take 20 mLs by mouth 2 (two) times daily as needed (congestion and cough).    Marland Kitchen ibuprofen (ADVIL,MOTRIN) 200 MG tablet Take 400 mg by mouth daily as needed for fever or moderate pain.    Marland Kitchen lidocaine-prilocaine (EMLA) cream Apply 1 application topically as needed. 30 g 6  . ondansetron (ZOFRAN) 8 MG tablet Take 1 tablet (8 mg total) by mouth 2 (two) times daily as needed for refractory nausea / vomiting. Start on day 3 after cyclophosphamide. 30 tablet 1  . prochlorperazine (COMPAZINE) 10 MG tablet Take 1 tablet (10 mg total) by mouth every 6 (six) hours as needed (Nausea or vomiting). 30 tablet 6   No current facility-administered medications for this visit.     PHYSICAL EXAMINATION: ECOG PERFORMANCE STATUS: 1 - Symptomatic but completely ambulatory  . Vitals:   11/27/17 0847  BP: 116/73  Pulse: 98  Resp: 18  Temp: 97.6 F (36.4 C)  SpO2: 95%    Filed Weights   11/27/17 0847  Weight: 161 lb 6.4 oz (73.2 kg)   .Body mass index is 25.28 kg/m.  GENERAL:alert, in no acute distress and comfortable SKIN: no acute rashes, no significant lesions EYES: conjunctiva are pink and non-injected, sclera anicteric OROPHARYNX: MMM, no exudates, no oropharyngeal erythema or ulceration NECK: supple, no  JVD LYMPH:  no palpable lymphadenopathy in the cervical, axillary or inguinal regions LUNGS: clear to auscultation b/l with normal respiratory effort HEART: regular rate & rhythm ABDOMEN:  normoactive bowel sounds , non tender, not distended. No palpable hepatosplenomegaly Extremity: no pedal edema PSYCH: alert & oriented x 3 with fluent speech NEURO: no focal motor/sensory deficits  LABORATORY DATA:   I have reviewed the data as listed  . CBC Latest Ref Rng & Units 11/27/2017 11/06/2017 10/23/2017  WBC 4.0 - 10.3 10e3/uL 6.4 7.2 22.9(H)  Hemoglobin 13.0 - 17.1 g/dL 11.0(L) 11.2(L) 11.0(L)  Hematocrit 38.4 - 49.9 % 34.0(L) 36.1(L) 34.0(L)  Platelets 140 - 400 10e3/uL 306 356 411(H)   . CBC    Component Value Date/Time   WBC 6.4 11/27/2017 0810   WBC 5.1 10/06/2017 0449   RBC 3.53 (L) 11/27/2017 0810   RBC 3.50 (L) 10/06/2017 0449  HGB 11.0 (L) 11/27/2017 0810   HCT 34.0 (L) 11/27/2017 0810   PLT 306 11/27/2017 0810   MCV 96.2 11/27/2017 0810   MCH 31.3 11/27/2017 0810   MCH 31.7 10/06/2017 0449   MCHC 32.5 11/27/2017 0810   MCHC 33.3 10/06/2017 0449   RDW 18.5 (H) 11/27/2017 0810   LYMPHSABS 0.7 (L) 11/27/2017 0810   MONOABS 1.2 (H) 11/27/2017 0810   EOSABS 0.1 11/27/2017 0810   BASOSABS 0.1 11/27/2017 0810    . CMP Latest Ref Rng & Units 11/27/2017 11/06/2017 10/23/2017  Glucose 70 - 140 mg/dl 102 111 120  BUN 7.0 - 26.0 mg/dL 17.8 14.6 12.5  Creatinine 0.7 - 1.3 mg/dL 0.9 0.9 0.8  Sodium 136 - 145 mEq/L 141 139 136  Potassium 3.5 - 5.1 mEq/L 4.1 4.0 3.7  Chloride 101 - 111 mmol/L - - -  CO2 22 - 29 mEq/L 24 26 24   Calcium 8.4 - 10.4 mg/dL 8.8 8.8 8.9  Total Protein 6.4 - 8.3 g/dL 7.1 7.8 7.2  Total Bilirubin 0.20 - 1.20 mg/dL 0.34 0.29 0.43  Alkaline Phos 40 - 150 U/L 75 70 82  AST 5 - 34 U/L 23 27 40(H)  ALT 0 - 55 U/L 21 36 55  . Lab Results  Component Value Date   LDH 307 (H) 11/27/2017         RADIOGRAPHIC STUDIES: I have personally  reviewed the radiological images as listed and agreed with the findings in the report.  [facility] [employer].                        [place], [place] [address]                            [phone]  ------------------------------------------------------------------- Transthoracic Echocardiography  Patient:    Romulo, Okray MR #:       [id number] Study Date: 07/23/2017 Gender:     M Age:        9 Height:     172.7 cm Weight:     75.8 kg BSA:        1.92 m^2 Pt. Status: Room:   PERFORMING   [facility], Outpatient  SONOGRAPHER  Darlina Sicilian, RDCS  ORDERING     Lebron Conners, Mikhail G  cc:  ------------------------------------------------------------------- LV EF: 60% -   65%  ------------------------------------------------------------------- Indications:      V58.11 Chemotherapy Evaluation.  ------------------------------------------------------------------- History:   PMH:  Large B Cell Lymphoma of the lymph nodes of the neck. No prior cardiac history.  ------------------------------------------------------------------- Study Conclusions  - Left ventricle: The cavity size was normal. Wall thickness was   increased in a pattern of mild LVH. Systolic function was normal.   The estimated ejection fraction was in the range of 60% to 65%.   Wall motion was normal; there were no regional wall motion   abnormalities. Doppler parameters are consistent with abnormal   left ventricular relaxation (grade 1 diastolic dysfunction). - Aortic valve: There was mild to moderate regurgitation. - Aortic root: The aortic root was mildly dilated. - Mitral valve: There was mild regurgitation.  PET/CT 06/30/2017  IMPRESSION: 1. Highly hypermetabolic  right conglomerate supraclavicular mass. This is accompanied by accentuated metabolic activity in the palatine tonsillar pillars, a hypermetabolic right  buccinator lymph node, hypermetabolic thoracic lymph nodes including a mildly enlarged AP window lymph node and other upper normal size but mildly hypermetabolic hilar and infrahilar nodes. Appearance compatible with malignancy such as lymphoma or adenocarcinoma, tissue diagnosis is recommended and probably most readily accessible from the large right supraclavicular mass. 2.  Aortic Atherosclerosis (ICD10-I70.0). 3. Left adrenal adenoma. 4. Chronic left maxillary sinusitis.   Electronically Signed   By: Van Clines M.D.   On: 06/30/2017 11:28   PET/CT 09/03/2017 IMPRESSION: 1. Partial response to therapy. Decrease in size and FDG uptake associated with hypermetabolic adenopathy within the neck and chest. Persistent uptake within the right supraclavicular region and AP window nodal station is identified (Deauville Criteria 4) compatible with residual metabolically active tumor.   Electronically Signed   By: Kerby Moors M.D.   On: 09/03/2017 10:13  No results found.  ASSESSMENT & PLAN:   #1 stage IIA diffuse large B-cell lymphoma of the right side of the neck with evidence of bulky disease.  S/p 5 cycles of R-CHOP/ PET/CT after 3 cycles -still some residual deauville 4 disease which is somewhat concerning LDH elevated on labs today -- could be from neulasta or recently pneumonia but cannot residual lymphoma (thoguh less likely) . Lab Results  Component Value Date   LDH 307 (H) 11/27/2017   #2 Recent Pneumonia - now resolved. Rpt ECHO 11/19 -- nl EF 60-65% #3 Fatigue - grade 2. #4 Mild anemia -- related to chemotherapy Plan -Labs today appears stable. Results discussed the patient. -no overt new prohibitive toxicities -Patient appropriate to proceed with 6th cycle of R-CHOP with neulasta support. -PET/CT in 3 weeks RTC with Dr Lebron Conners in 4 weeks with labs Cancer rehab referral for PT for fatigue Prevnar vaccination in 4 weeks and PCV in 12 weeks   I  spent 20 minutes counseling the patient face to face. The total time spent in the appointment was 25 minutes and more than 50% was on counseling and direct patient cares.    Sullivan Lone MD [place] AAHIVMS [facility] Hematology/Oncology Physician [facility]  (Office):       [phone] (Work cell):  [phone] (Fax):           [phone]

## 2017-12-11 NOTE — Progress Notes (Signed)
[facility] Follow-up Visit:  Assessment: Diffuse large B-cell lymphoma of lymph nodes of neck ([place]) 72 y.o. male with diagnosis of activated B-cell diffuse large B-cell lymphoma of the lymph nodes of the right side of the neck. Currently available staging information suggests presence of stage II bulky disease. Based on the diagnosis, patient was started on R-CHOP chemotherapy. There has been significant reduction in the bulk of the lymphoma by examination. Interval PET/CT demonstrates strong partial response, although we still see an area of increased FDG uptake in the main disease focus.  She returns to the clinic for consideration of initiation of the fifth cycle of systemic chemoimmunotherapy.  Pneumonia has responded well to antibiotics, patient's performance status has returned back to baseline.  Clinical evaluation lab work today permissive to proceed with the next cycle of systemic therapy.  Plan: --Proceed with Cycle #5 of R-CHOP --RTC 3 weeks for possible Cycle #6 of therapy --Repeat PET/CT after completion of 6 cycles of therapy. --She will be referred to radiation oncology for adjuvant radiation due to initial presentation will bulky disease in the right supraclavicular lymph node basin   Voice recognition software was used and creation of this note. Despite my best effort at editing the text, some misspelling/errors may have occurred.  Orders Placed This Encounter  Procedures  . CBC with Differential    Standing Status:   Future    Number of Occurrences:   1    Standing Expiration Date:   11/06/2018  . Comprehensive metabolic panel    Standing Status:   Future    Number of Occurrences:   1    Standing Expiration Date:   11/06/2018  . Lactate dehydrogenase (LDH)    Standing Status:   Future    Number of Occurrences:   1    Standing Expiration Date:   11/06/2018  . Uric acid    Standing Status:   Future    Number of Occurrences:   1    Standing  Expiration Date:   11/06/2018  . Magnesium    Standing Status:   Future    Number of Occurrences:   1    Standing Expiration Date:   11/06/2018  . Phosphorus    Standing Status:   Future    Number of Occurrences:   1    Standing Expiration Date:   11/06/2018    Cancer Staging Diffuse large B-cell lymphoma of lymph nodes of neck (HCC) Staging form: Hodgkin and Non-Hodgkin Lymphoma, AJCC 8th Edition - Clinical stage from 07/13/2017: Stage II bulky (Diffuse large B-cell lymphoma) - Signed by Ardath Sax, MD on 07/20/2017   All questions were answered.  . The patient knows to call the clinic with any problems, questions or concerns.  This note was electronically signed.    History of Presenting Illness Todd Bell is a  72 y.o. [place] male follwed in the [place] for diagnosis of diffuse large B-cell lymphoma.  Please see oncologic history below for details of the diagnosis. Patient has initially discovered asymmetry in his neck with visible mass earlier in July. He was seen by his primary care provider and subsequently directly to be evaluated by ENT. Patient underwent initially ultrasound of the neck demonstrating a large mass, subsequently he underwent CT of the neck on 06/15/17 demonstrating normal laryngeal, pharyngeal, salivary structures and a 5.0 cm supraclavicular mass on the right side. CT of the chest obtained the same time demonstrated mass, but no lymphadenopathy in  the mediastinum, hilar structures, and a 0.4 cm right upper lobe nodule and a 0.5 cm left upper lobe nodule. Patient underwent an FNA on 06/17/17 which demonstrated presence of malignant cells that could not be further characterized due to insufficient sample. Initially, patient denied any significant symptoms in association with this mass. He denied any fevers, chills, night sweats. No significant weight loss. No changes to his activities of daily living or activity tolerance. Denied changes to  his appetite. Denied chest pain, shortness of breath or cough. [name] had no nausea, early satiety, abdominal pain, diarrhea, or constipation. No dysuria or hematuria. Denied any cutaneous changes. Denied any vision changes, hearing changes, swallowing difficulties, any focal weakness or sensory deficits in the face or extremities.  Patient is currently undergoing curative-intent chemotherapy with R-CHOP. He returns to the clinic for possible administration of the 5th cycle of therapy.  Initiation of cycle was delayed due to development of pneumonia of unknown subtype.  Patient was treated with enteral antibiotic therapy and improved rapidly in terms of resolution of confusion, improvement of respiration, and resolution of cough.  No residual fever, night sweats, or chills.  Patient has returned back to his normal performance status.  Last week, he returned to normal activity and was raking leaves after which she has developed rash on both arms.  He took Benadryl for 5-7 days 25 mg daily and rash has completely resolved at this time.  Oncological/hematological History:   Diffuse large B-cell lymphoma of lymph nodes of neck ([place])   06/15/2017 Imaging    CT Neck/Chest: Normal laryngeal, pharyngeal, salivary structures and a 5.0 cm supraclavicular mass on the right side.No lymphadenopathy in the mediastinum, hilar structures, and a 0.4 cm right upper lobe nodule and a 0.5 cm left upper lobe nodule.       06/30/2017 Imaging    PET-CT: Highly hypermetabolic right conglomerate supraclavicular mass. This is accompanied by accentuated metabolic activity in the palatine tonsillar pillars, a hypermetabolic right buccinator lymph node, hypermetabolic thoracic lymph nodes including a mildly enlarged AP window lymph node and other upper normal size but mildly hypermetabolic hilar and infrahilar nodes. Appearance compatible with malignancy such as lymphoma or adenocarcinoma      07/13/2017 Pathology Results    Rt  Neck mass core bx: Sheets of large lymphocytes discernible lymph node structure, consistent with diffuse large B-cell lymphoma. IHC -- positive for LCA, CD20, CD79a, PAX-5, BCL-6 & negative for CD10, CD34, BCL-2, CD30, CD138, kappa or lambda light chains      07/17/2017 Initial Diagnosis    Diffuse large B-cell lymphoma of lymph nodes of neck ([place])      07/29/2017 Procedure    Placement of left IJ power-injectable port catheter.       07/31/2017 -  Chemotherapy    R-CHOP21: Rituximab 365m/m2, d1 + Cyclophosphamide 7586mm2, d1 + Doxorubicin 5092m2, d1 + Vincristine 2mg33m1 + Prednisone 60mg31mQDay, d1-5 Q21 d x6 cycles planned --Cycle #1, 07/31/17: --Cycle #2, 08/21/17: --Cycle #3, 09/11/17:  --Cycle #4, 09/2643/03/47plicated by pneumonia with unknown microorganism --Cycle #5, 11/06/17: delayed due to pneumonia       09/03/2017 Imaging    PET-CT: The index nodal mass in the right supraclavicular region measures 3.5 cm and has an SUV max equal to 6.33 (Deauville Criteria 4). On the previous exam this measured 7.7 cm and had an SUV max equal to 29. The right buccinator node measures 6 mm and has an SUV max equal to 3.13 (Deauville  Criteria 3). Previously 7 mm with an SUV max equal to 10.1. Mild increased FDG uptake associated with mediastinal and bilateral hilar regions. The index AP window lymph node measures 11 mm and has an SUV max equal to 3.93. (Deauville Criteria 4). Previously this measured 1.4 cm and had an SUV max of 4.9.       Medical History: Past Medical History:  Diagnosis Date  . Cancer ([place])   . Medical history non-contributory   . Non Hodgkin's lymphoma [facility])     Surgical History: Past Surgical History:  Procedure Laterality Date  . CATARACT EXTRACTION    . COLONOSCOPY  02/2016  . IR FLUORO GUIDE PORT INSERTION LEFT  07/29/2017  . IR US GUIDE VASC ACCESS LEFT  07/29/2017  . LYMPH NODE BIOPSY Right 07/13/2017   Procedure: OPEN NECK LYMPH NODE BIOPSY RIGHT SIDE;   Surgeon: Izora Gala, MD;  Location: [facility];  Service: ENT;  Laterality: Right;    Family History: Family History  Problem Relation Age of Onset  . Cancer Father        Pancreatic    Social History: Social History   Socioeconomic History  . Marital status: Married    Spouse name: Not on file  . Number of children: Not on file  . Years of education: Not on file  . Highest education level: Not on file  Social Needs  . Financial resource strain: Not on file  . Food insecurity - worry: Not on file  . Food insecurity - inability: Not on file  . Transportation needs - medical: Not on file  . Transportation needs - non-medical: Not on file  Occupational History  . Not on file  Tobacco Use  . Smoking status: Former Smoker    Last attempt to quit: 1988    Years since quitting: 31.0  . Smokeless tobacco: Never Used  . Tobacco comment: he was a social drinker  Substance and Sexual Activity  . Alcohol use: Yes    Comment: occasional  . Drug use: No  . Sexual activity: Not on file  Other Topics Concern  . Not on file  Social History Narrative  . Not on file    Allergies: No Known Allergies  Medications:  Current Outpatient Medications  Medication Sig Dispense Refill  . acetaminophen (TYLENOL) 325 MG tablet Take 2 tablets (650 mg total) by mouth every 6 (six) hours as needed for mild pain (or Fever >/= 101). 30 tablet 0  . Dextromethorphan-Guaifenesin (ROBITUSSIN SUGAR FREE) 10-100 MG/5ML liquid Take 20 mLs by mouth 2 (two) times daily as needed (congestion and cough).    Marland Kitchen ibuprofen (ADVIL,MOTRIN) 200 MG tablet Take 400 mg by mouth daily as needed for fever or moderate pain.    Marland Kitchen lidocaine-prilocaine (EMLA) cream Apply 1 application topically as needed. 30 g 6  . ondansetron (ZOFRAN) 8 MG tablet Take 1 tablet (8 mg total) by mouth 2 (two) times daily as needed for refractory nausea / vomiting. Start on day 3 after cyclophosphamide. 30 tablet 1  .  prochlorperazine (COMPAZINE) 10 MG tablet Take 1 tablet (10 mg total) by mouth every 6 (six) hours as needed (Nausea or vomiting). 30 tablet 6   No current facility-administered medications for this visit.     Review of Systems: Review of Systems  Constitutional: Positive for unexpected weight change.  Respiratory: Negative for chest tightness and cough.   Psychiatric/Behavioral: Negative for confusion.  All other systems reviewed and are negative.  PHYSICAL EXAMINATION Blood pressure 120/73, pulse 98, temperature 98.3 F (36.8 C), temperature source Oral, resp. rate 18, height 5' 7"  (1.702 m), weight 165 lb 6.4 oz (75 kg), SpO2 98 %.  ECOG PERFORMANCE STATUS: 1 - Symptomatic but completely ambulatory  Physical Exam  Constitutional: He is oriented to person, place, and time and well-developed, well-nourished, and in no distress. Vital signs are normal. He appears not malnourished, not dehydrated and not jaundiced. He does not have a sickly appearance. No distress.  HENT:  Mouth/Throat: Uvula is midline, oropharynx is clear and moist and mucous membranes are normal. Mucous membranes are not pale, not dry and not cyanotic. He has dentures. Normal dentition.  Eyes: Conjunctivae and EOM are normal. Pupils are equal, round, and reactive to light. No scleral icterus.  Neck: No JVD present. Carotid bruit is not present.  Cardiovascular: Normal rate, regular rhythm and normal heart sounds.  No murmur heard. Pulmonary/Chest: He has no decreased breath sounds. He has no rhonchi.  Significant interval improvement in the breath sounds bilaterally.  No expiratory wheezing, good bilateral aeration.  Abdominal: Soft. Normal appearance and normal aorta. There is no hepatosplenomegaly. There is no tenderness.  Lymphadenopathy:       Head (right side): No submental, no submandibular, no tonsillar and no occipital adenopathy present.       Head (left side): No submental, no submandibular, no  tonsillar and no occipital adenopathy present.    He has no cervical adenopathy.       Right cervical: No posterior cervical adenopathy present.      Left cervical: No superficial cervical, no deep cervical and no posterior cervical adenopathy present.    He has no axillary adenopathy.       Right: No inguinal and no supraclavicular adenopathy present.       Left: No inguinal and no supraclavicular adenopathy present.  Complete resolution of the previously palpable bulky mass in the right lower neck/supraclavicular fossa.  Neurological: He is alert and oriented to person, place, and time. He has normal sensation, normal strength, normal reflexes and intact cranial nerves.  Skin: He is not diaphoretic.     LABORATORY DATA: I have personally reviewed the data as listed: Appointment on 11/06/2017  Component Date Value Ref Range Status  . Phosphorus, Ser 11/06/2017 3.0  2.5 - 4.5 mg/dL Final  . Magnesium 11/06/2017 2.4  1.5 - 2.5 mg/dl Final  . LDH 11/06/2017 368* 125 - 245 U/L Final  . Sodium 11/06/2017 139  136 - 145 mEq/L Final  . Potassium 11/06/2017 4.0  3.5 - 5.1 mEq/L Final  . Chloride 11/06/2017 105  98 - 109 mEq/L Final  . CO2 11/06/2017 26  22 - 29 mEq/L Final  . Glucose 11/06/2017 111  70 - 140 mg/dl Final   Glucose reference range is for nonfasting patients. Fasting glucose reference range is 70- 100.  Marland Kitchen BUN 11/06/2017 14.6  7.0 - 26.0 mg/dL Final  . Creatinine 11/06/2017 0.9  0.7 - 1.3 mg/dL Final  . Total Bilirubin 11/06/2017 0.29  0.20 - 1.20 mg/dL Final  . Alkaline Phosphatase 11/06/2017 70  40 - 150 U/L Final  . AST 11/06/2017 27  5 - 34 U/L Final  . ALT 11/06/2017 36  0 - 55 U/L Final  . Total Protein 11/06/2017 7.8  6.4 - 8.3 g/dL Final  . Albumin 11/06/2017 3.0* 3.5 - 5.0 g/dL Final  . Calcium 11/06/2017 8.8  8.4 - 10.4 mg/dL Final  . Anion Gap 11/06/2017  7  3 - 11 mEq/L Final  . EGFR 11/06/2017 >60  >60 ml/min/1.73 m2 Final   eGFR is calculated using the CKD-EPI  Creatinine Equation (2009)  . WBC 11/06/2017 7.2  4.0 - 10.3 10e3/uL Final  . NEUT# 11/06/2017 5.2  1.5 - 6.5 10e3/uL Final  . HGB 11/06/2017 11.2* 13.0 - 17.1 g/dL Final  . HCT 11/06/2017 36.1* 38.4 - 49.9 % Final  . Platelets 11/06/2017 356  140 - 400 10e3/uL Final  . MCV 11/06/2017 99.4* 79.3 - 98.0 fL Final  . MCH 11/06/2017 30.9  27.2 - 33.4 pg Final  . MCHC 11/06/2017 31.0* 32.0 - 36.0 g/dL Final  . RBC 11/06/2017 3.63* 4.20 - 5.82 10e6/uL Final  . RDW 11/06/2017 16.5* 11.0 - 14.6 % Final  . lymph# 11/06/2017 1.0  0.9 - 3.3 10e3/uL Final  . MONO# 11/06/2017 0.4  0.1 - 0.9 10e3/uL Final  . Eosinophils Absolute 11/06/2017 0.7* 0.0 - 0.5 10e3/uL Final  . Basophils Absolute 11/06/2017 0.0  0.0 - 0.1 10e3/uL Final  . NEUT% 11/06/2017 71.6  39.0 - 75.0 % Final  . LYMPH% 11/06/2017 13.9* 14.0 - 49.0 % Final  . MONO% 11/06/2017 5.1  0.0 - 14.0 % Final  . EOS% 11/06/2017 9.0* 0.0 - 7.0 % Final  . BASO% 11/06/2017 0.4  0.0 - 2.0 % Final       Ardath Sax, MD

## 2017-12-11 NOTE — Assessment & Plan Note (Signed)
72 y.o. male with diagnosis of activated B-cell diffuse large B-cell lymphoma of the lymph nodes of the right side of the neck. Currently available staging information suggests presence of stage II bulky disease. Based on the diagnosis, patient was started on R-CHOP chemotherapy. There has been significant reduction in the bulk of the lymphoma by examination. Interval PET/CT demonstrates strong partial response, although we still see an area of increased FDG uptake in the main disease focus.  She returns to the clinic for consideration of initiation of the fifth cycle of systemic chemoimmunotherapy.  Pneumonia has responded well to antibiotics, patient's performance status has returned back to baseline.  Clinical evaluation lab work today permissive to proceed with the next cycle of systemic therapy.  Plan: --Proceed with Cycle #5 of R-CHOP --RTC 3 weeks for possible Cycle #6 of therapy --Repeat PET/CT after completion of 6 cycles of therapy. --She will be referred to radiation oncology for adjuvant radiation due to initial presentation will bulky disease in the right supraclavicular lymph node basin

## 2017-12-14 ENCOUNTER — Ambulatory Visit: Payer: Medicare Other | Attending: Hematology | Admitting: Physical Therapy

## 2017-12-14 ENCOUNTER — Encounter: Payer: Self-pay | Admitting: Physical Therapy

## 2017-12-14 DIAGNOSIS — R262 Difficulty in walking, not elsewhere classified: Secondary | ICD-10-CM | POA: Diagnosis not present

## 2017-12-14 NOTE — Therapy (Signed)
[place], [place], [address] Phone: [phone]   Fax:  [phone]  Physical Therapy Evaluation  Patient Details  Name: Todd Bell MRN: [id number] Date of Birth: 1946/01/11 No Data Recorded  Encounter Date: 12/14/2017  PT End of Session - 12/14/17 1425    Visit Number  1    Number of Visits  1    PT Start Time  3354    PT Stop Time  1420    PT Time Calculation (min)  28 min    Activity Tolerance  Patient tolerated treatment well    Behavior During Therapy  [facility] for tasks assessed/performed       Past Medical History:  Diagnosis Date  . Cancer ([place])   . Medical history non-contributory   . Non Hodgkin's lymphoma [facility])     Past Surgical History:  Procedure Laterality Date  . CATARACT EXTRACTION    . COLONOSCOPY  02/2016  . IR FLUORO GUIDE PORT INSERTION LEFT  07/29/2017  . IR US GUIDE VASC ACCESS LEFT  07/29/2017  . LYMPH NODE BIOPSY Right 07/13/2017   Procedure: OPEN NECK LYMPH NODE BIOPSY RIGHT SIDE;  Surgeon: Izora Gala, MD;  Location: [facility];  Service: ENT;  Laterality: Right;    There were no vitals filed for this visit.   Subjective Assessment - 12/14/17 1357    Subjective  I lift 5 lb weights everyday. I have finished chemotherapy. I walk around [facility] for two hours. I walk in the park. I don't think I need physical therapy. I am going to have a PET scan and I may have to have radiation.     Patient Stated Goals  I do not feel like I need therapy. I do not know why I am here.     Currently in Pain?  No/denies    Pain Score  0-No pain         [facility] PT Assessment - 12/14/17 0001      Assessment   Medical Diagnosis  diffuse B cell lymphoma  (Pended)     Referring Provider  Irene Limbo  (Pended)     Onset Date/Surgical Date  06/10/17  (Pended)     Hand Dominance  Right  (Pended)     Prior Therapy  none  (Pended)       Precautions   Precautions  None  (Pended)        Balance Screen   Has the patient fallen in the past 6 months  No  (Pended)     Has the patient had a decrease in activity level because of a fear of falling?   No  (Pended)     Is the patient reluctant to leave their home because of a fear of falling?   No  (Pended)       Home Environment   Living Environment  Private residence  (Pended)     Living Arrangements  Spouse/significant other  (Pended)     Available Help at Discharge  Family  (Pended)     Type of Home  House  (Pended)     Home Access  Level entry  (Pended)     Home Layout  One level  (Pended)       Prior Function   Level of Independence  Independent  (Pended)     Vocation  Retired  Museum/gallery conservator)     Leisure  walk, [employer], ride the bike  ([place])  Cognition   Overall Cognitive Status  Within Functional Limits for tasks assessed  (Pended)       ROM / Strength   AROM / PROM / Strength  Strength  (Pended)       Strength   Overall Strength  Within functional limits for tasks performed  (Pended)       Timed Up and Go Test   Normal TUG (seconds)  9  (Pended)              Objective measurements completed on examination: See above findings.                           Plan - 12/14/17 1425    Clinical Impression Statement  Pt was referred to PT for weakness, decreased endurance and balance. Pt states he is not sure why he is here. He does not feel that he has deficits that require therapy. Pt reports he walks daily, uses his exercise bike and lifts 5 lb weights. Pt was agreeable to evaluation. His strength was [facility]. He completed the TUG in 9 sec which means he is not at risk for falls. Pt reports he keeps getting better everyday since he completed chemotherapy and his appetite is improving. He does not feel that PT is appropriate at this time. PT encouraged pt to continue exercising at home like he is now. Did educate pt that he may require PT if he has to complete radiation due to increased  fatigue. Pt will be dishcarged at this time.     PT Frequency  One time visit    PT Next Visit Plan  one time visit    PT Home Exercise Plan  continue with walking, exercise bike and weight lifting    Consulted and Agree with Plan of Care  Patient       Patient will benefit from skilled therapeutic intervention in order to improve the following deficits and impairments:  Difficulty walking  Visit Diagnosis: Difficulty in walking, not elsewhere classified     Problem List Patient Active Problem List   Diagnosis Date Noted  . Diffuse large B-cell lymphoma of lymph nodes of neck ([place]) 07/17/2017    Allyson Sabal [facility] 12/14/2017, 2:30 PM  [place], [place], [address] Phone: [phone]   Fax:  [phone]  Name: Todd Bell MRN: [id number] Date of Birth: Apr 18, 1946   Manus Gunning, PT 12/14/17 2:32 PM

## 2017-12-24 ENCOUNTER — Ambulatory Visit (HOSPITAL_COMMUNITY)
Admission: RE | Admit: 2017-12-24 | Discharge: 2017-12-24 | Disposition: A | Discharge status: Home / Self Care | Payer: Medicare Other | Source: Ambulatory Visit | Attending: Hematology | Admitting: Hematology

## 2017-12-24 DIAGNOSIS — J849 Interstitial pulmonary disease, unspecified: Secondary | ICD-10-CM | POA: Diagnosis not present

## 2017-12-24 DIAGNOSIS — I779 Disorder of arteries and arterioles, unspecified: Secondary | ICD-10-CM | POA: Insufficient documentation

## 2017-12-24 DIAGNOSIS — C8331 Diffuse large B-cell lymphoma, lymph nodes of head, face, and neck: Secondary | ICD-10-CM | POA: Insufficient documentation

## 2017-12-24 DIAGNOSIS — C8512 Unspecified B-cell lymphoma, intrathoracic lymph nodes: Secondary | ICD-10-CM | POA: Diagnosis not present

## 2017-12-24 LAB — GLUCOSE, CAPILLARY: GLUCOSE-CAPILLARY: 95 mg/dL (ref 65–99)

## 2017-12-24 MED ORDER — FLUDEOXYGLUCOSE F - 18 (FDG) INJECTION: 7.8100 | Freq: Once | INTRAVENOUS | Status: DC

## 2017-12-25 ENCOUNTER — Encounter: Payer: Self-pay | Admitting: *Deleted

## 2017-12-25 ENCOUNTER — Inpatient Hospital Stay (HOSPITAL_BASED_OUTPATIENT_CLINIC_OR_DEPARTMENT_OTHER): Payer: Medicare Other | Admitting: Hematology and Oncology

## 2017-12-25 ENCOUNTER — Inpatient Hospital Stay: Payer: Medicare Other

## 2017-12-25 ENCOUNTER — Encounter: Payer: Self-pay | Admitting: Hematology and Oncology

## 2017-12-25 ENCOUNTER — Inpatient Hospital Stay: Payer: Medicare Other | Attending: Hematology and Oncology

## 2017-12-25 VITALS — BP 121/86 | HR 88 | Temp 97.8°F | Resp 18 | Wt 158.3 lb

## 2017-12-25 DIAGNOSIS — C8331 Diffuse large B-cell lymphoma, lymph nodes of head, face, and neck: Secondary | ICD-10-CM | POA: Diagnosis not present

## 2017-12-25 DIAGNOSIS — Z809 Family history of malignant neoplasm, unspecified: Secondary | ICD-10-CM | POA: Insufficient documentation

## 2017-12-25 DIAGNOSIS — Z87891 Personal history of nicotine dependence: Secondary | ICD-10-CM | POA: Diagnosis not present

## 2017-12-25 DIAGNOSIS — Z23 Encounter for immunization: Secondary | ICD-10-CM | POA: Insufficient documentation

## 2017-12-25 DIAGNOSIS — Z800 Family history of malignant neoplasm of digestive organs: Secondary | ICD-10-CM

## 2017-12-25 LAB — COMPREHENSIVE METABOLIC PANEL
ALT: 18 U/L (ref 0–55)
AST: 22 U/L (ref 5–34)
Albumin: 3.4 g/dL — ABNORMAL LOW (ref 3.5–5.0)
Alkaline Phosphatase: 71 U/L (ref 40–150)
Anion gap: 8 (ref 3–11)
BUN: 14 mg/dL (ref 7–26)
CALCIUM: 8.6 mg/dL (ref 8.4–10.4)
CHLORIDE: 107 mmol/L (ref 98–109)
CO2: 26 mmol/L (ref 22–29)
CREATININE: 0.77 mg/dL (ref 0.70–1.30)
Glucose, Bld: 100 mg/dL (ref 70–140)
Potassium: 3.8 mmol/L (ref 3.5–5.1)
Sodium: 141 mmol/L (ref 136–145)
TOTAL PROTEIN: 7.2 g/dL (ref 6.4–8.3)
Total Bilirubin: 0.3 mg/dL (ref 0.2–1.2)

## 2017-12-25 LAB — CBC WITH DIFFERENTIAL (CANCER CENTER ONLY)
Basophils Absolute: 0.1 10*3/uL (ref 0.0–0.1)
Basophils Relative: 1 %
EOS ABS: 0.5 10*3/uL (ref 0.0–0.5)
Eosinophils Relative: 12 %
HCT: 33.5 % — ABNORMAL LOW (ref 38.4–49.9)
Hemoglobin: 10.9 g/dL — ABNORMAL LOW (ref 13.0–17.1)
LYMPHS ABS: 0.8 10*3/uL — AB (ref 0.9–3.3)
Lymphocytes Relative: 17 %
MCH: 31.6 pg (ref 27.2–33.4)
MCHC: 32.6 g/dL (ref 32.0–36.0)
MCV: 96.7 fL (ref 79.3–98.0)
Monocytes Absolute: 0.9 10*3/uL (ref 0.1–0.9)
Monocytes Relative: 20 %
Neutro Abs: 2.1 10*3/uL (ref 1.5–6.5)
Neutrophils Relative %: 50 %
Platelet Count: 290 10*3/uL (ref 140–400)
RBC: 3.47 MIL/uL — AB (ref 4.20–5.82)
RDW: 19.4 % — ABNORMAL HIGH (ref 11.0–15.6)
WBC: 4.3 10*3/uL (ref 4.0–10.3)

## 2017-12-25 LAB — LACTATE DEHYDROGENASE: LDH: 266 U/L — AB (ref 125–245)

## 2017-12-25 MED ORDER — PNEUMOCOCCAL 13-VAL CONJ VACC IM SUSP
0.5000 mL | Freq: Once | INTRAMUSCULAR | Status: AC
  Administered 2017-12-25: 0.5 mL via INTRAMUSCULAR
  Filled 2017-12-25: qty 0.5

## 2017-12-25 NOTE — Patient Instructions (Signed)
Pneumococcal Conjugate Vaccine suspension for injection What is this medicine? PNEUMOCOCCAL VACCINE (NEU mo KOK al vak SEEN) is a vaccine used to prevent pneumococcus bacterial infections. These bacteria can cause serious infections like pneumonia, meningitis, and blood infections. This vaccine will lower your chance of getting pneumonia. If you do get pneumonia, it can make your symptoms milder and your illness shorter. This vaccine will not treat an infection and will not cause infection. This vaccine is recommended for infants and young children, adults with certain medical conditions, and adults 65 years or older. This medicine may be used for other purposes; ask your health care provider or pharmacist if you have questions. COMMON BRAND NAME(S): Prevnar, Prevnar 13 What should I tell my health care provider before I take this medicine? They need to know if you have any of these conditions: -bleeding problems -fever -immune system problems -an unusual or allergic reaction to pneumococcal vaccine, diphtheria toxoid, other vaccines, latex, other medicines, foods, dyes, or preservatives -pregnant or trying to get pregnant -breast-feeding How should I use this medicine? This vaccine is for injection into a muscle. It is given by a health care professional. A copy of Vaccine Information Statements will be given before each vaccination. Read this sheet carefully each time. The sheet may change frequently. Talk to your pediatrician regarding the use of this medicine in children. While this drug may be prescribed for children as young as 6 weeks old for selected conditions, precautions do apply. Overdosage: If you think you have taken too much of this medicine contact a poison control center or emergency room at once. NOTE: This medicine is only for you. Do not share this medicine with others. What if I miss a dose? It is important not to miss your dose. Call your doctor or health care professional  if you are unable to keep an appointment. What may interact with this medicine? -medicines for cancer chemotherapy -medicines that suppress your immune function -steroid medicines like prednisone or cortisone This list may not describe all possible interactions. Give your health care provider a list of all the medicines, herbs, non-prescription drugs, or dietary supplements you use. Also tell them if you smoke, drink alcohol, or use illegal drugs. Some items may interact with your medicine. What should I watch for while using this medicine? Mild fever and pain should go away in 3 days or less. Report any unusual symptoms to your doctor or health care professional. What side effects may I notice from receiving this medicine? Side effects that you should report to your doctor or health care professional as soon as possible: -allergic reactions like skin rash, itching or hives, swelling of the face, lips, or tongue -breathing problems -confused -fast or irregular heartbeat -fever over 102 degrees F -seizures -unusual bleeding or bruising -unusual muscle weakness Side effects that usually do not require medical attention (report to your doctor or health care professional if they continue or are bothersome): -aches and pains -diarrhea -fever of 102 degrees F or less -headache -irritable -loss of appetite -pain, tender at site where injected -trouble sleeping This list may not describe all possible side effects. Call your doctor for medical advice about side effects. You may report side effects to FDA at 1-800-FDA-1088. Where should I keep my medicine? This does not apply. This vaccine is given in a clinic, pharmacy, doctor's office, or other health care setting and will not be stored at home. NOTE: This sheet is a summary. It may not cover all possible information.   If you have questions about this medicine, talk to your doctor, pharmacist, or health care provider.  2018 Elsevier/Gold  Standard (2014-08-31 10:27:27)  

## 2017-12-25 NOTE — Progress Notes (Signed)
Oncology Nurse Navigator Documentation  Met with Todd Bell and his wife during est pt appt with Dr. Lebron Conners.  They were accompanied by their 2 dtrs. They voiced understanding:  Of yesterday's PET results, Dr. Clydene Laming guidance to start radiotherapy.  I will coordinate f/u appt with Dr. Isidore Moos and CT [facility] for next availables. They understand to call me with questions/concerns.  Gayleen Orem, RN, BSN Head & Neck Oncology Nurse [place] at [place] [phone]

## 2017-12-26 ENCOUNTER — Telehealth: Payer: Self-pay | Admitting: *Deleted

## 2017-12-26 NOTE — Telephone Encounter (Signed)
Oncology Nurse Navigator Documentation  Called pt wife to inform her of 1/23 1:00 appt with Dr. Isidore Moos to be followed by CT [facility].  I explained procedure for lobby registration and arrival to Radiation Waiting.  She voiced understanding of information.  Gayleen Orem, RN, BSN Head & Neck Oncology Nurse [place] at [place] [phone]

## 2017-12-28 NOTE — Progress Notes (Signed)
Has armband been applied?  Yes  Does patient have an allergy to IV contrast dye?: No   Has patient ever received premedication for IV contrast dye?: N/A  Does patient take metformin?: No  If patient does take metformin when was the last dose: N/A  Date of lab work: 12/25/17 CR: 0.77 EGFR: >60  IV site: Left PAC  Has IV site been added to flowsheet?  Yes

## 2017-12-30 ENCOUNTER — Ambulatory Visit
Admission: RE | Admit: 2017-12-30 | Discharge: 2017-12-30 | Disposition: A | Discharge status: Home / Self Care | Payer: Medicare Other | Source: Ambulatory Visit | Attending: Radiation Oncology | Admitting: Radiation Oncology

## 2017-12-30 ENCOUNTER — Encounter: Payer: Self-pay | Admitting: *Deleted

## 2017-12-30 ENCOUNTER — Encounter: Payer: Self-pay | Admitting: Radiation Oncology

## 2017-12-30 ENCOUNTER — Other Ambulatory Visit: Payer: Self-pay

## 2017-12-30 VITALS — BP 117/84 | HR 101 | Temp 97.9°F | Wt 157.8 lb

## 2017-12-30 DIAGNOSIS — Z510 Encounter for antineoplastic radiation therapy: Secondary | ICD-10-CM | POA: Diagnosis not present

## 2017-12-30 DIAGNOSIS — C8332 Diffuse large B-cell lymphoma, intrathoracic lymph nodes: Secondary | ICD-10-CM

## 2017-12-30 DIAGNOSIS — Z9221 Personal history of antineoplastic chemotherapy: Secondary | ICD-10-CM | POA: Insufficient documentation

## 2017-12-30 DIAGNOSIS — C8331 Diffuse large B-cell lymphoma, lymph nodes of head, face, and neck: Secondary | ICD-10-CM | POA: Diagnosis not present

## 2017-12-30 DIAGNOSIS — J849 Interstitial pulmonary disease, unspecified: Secondary | ICD-10-CM | POA: Diagnosis not present

## 2017-12-30 DIAGNOSIS — C8338 Diffuse large B-cell lymphoma, lymph nodes of multiple sites: Secondary | ICD-10-CM | POA: Insufficient documentation

## 2017-12-30 DIAGNOSIS — Z7982 Long term (current) use of aspirin: Secondary | ICD-10-CM | POA: Insufficient documentation

## 2017-12-30 MED ORDER — SODIUM CHLORIDE 0.9% FLUSH
10.0000 mL | Freq: Once | INTRAVENOUS | Status: AC
  Administered 2017-12-30: 10 mL via INTRAVENOUS

## 2017-12-30 MED ORDER — HEPARIN SOD (PORK) LOCK FLUSH 100 UNIT/ML IV SOLN
500.0000 [IU] | Freq: Once | INTRAVENOUS | Status: AC
  Administered 2017-12-30: 500 [IU] via INTRAVENOUS

## 2017-12-30 NOTE — Progress Notes (Signed)
Head and Neck / Chest Cancer Simulation, IMRT treatment planning note   Outpatient  Diagnosis:    ICD-10-CM   1. Diffuse large B-cell lymphoma of lymph nodes of neck (HCC) C83.31     The patient was taken to the CT simulator and laid in the supine position on the table. An Aquaplast head and shoulder mask was custom fitted to the patient's anatomy. High-resolution CT axial imaging was obtained of the head and neck with contrast. I verified that the quality of the imaging is good for treatment planning. 1 Medically Necessary Treatment Device was fabricated and supervised by me: Aquaplast mask.   Treatment planning note I plan to treat the patient with IMRT. I plan to treat the patient's involved sites (from initial PET) in neck, chest. I plan to treat to a total dose of 43.2 Gray in 24  Fractions to the chest,  36Gy in 18 fractions to neck.. Dose calculation was ordered from dosimetry.  IMRT planning Note  IMRT is medically necessary and an important modality to deliver adequate dose to the patient's at risk tissues while sparing the patient's normal structures, including the: esophagus, parotid tissue, mandible, brain stem, spinal cord, oral cavity, brachial plexus, lungs, heart.  This justifies the use of IMRT in the patient's treatment.     -----------------------------------  Eppie Gibson, MD

## 2017-12-30 NOTE — Progress Notes (Deleted)
error 

## 2017-12-30 NOTE — Progress Notes (Signed)
Radiation Oncology         ([phone]) [phone] ________________________________  Name: Todd Bell MRN: [id number]  Date: 12/30/2017  DOB: February 24, 1946  Follow-Up Visit Note  Outpatient  CC: Lujean Amel, MD  Izora Gala, MD  Diagnosis and Prior Radiotherapy: Stage IIA DLBCL of the neck/chest      ICD-10-CM   1. Diffuse large B-cell lymphoma of lymph nodes of neck (HCC) C83.31     STAGE IIA DLBCL of the neck/chest  CHIEF COMPLAINT: Here for follow-up and surveillance of diffuse large B-cell lymphoma cancer  Narrative:  The patient returns today for follow-up and CT simulation.  The patient was originally seen for consultation on 07/17/2017. Since our last visit, the patient completed 6 cycles R-CHOP chemotherapy with Dr. Irene Limbo. Last infusion was on 11/27/2017.  Most recent PET on 12/24/2017 showed partial response to therapy. Resolution of cervical nodal hypermetabolism with similar and slightly improved thoracic nodal hypermetabolism. No subdiaphragmatic disease identified. Interstitial lung disease, suboptimally evaluated.   On review of systems, the patient denies having any pain. He denies having any fatigue. He denies having any dysphagia or odynophagia. He does report dry mouth. He denies having thick saliva. States that about 80% of his taste has returned since chemotherapy treatment. He denies having any mouth ulcers. States that he does not have any skin changes. States the he has lost about 3 lbs. He denies trouble swallowing.    ALLERGIES:  has No Known Allergies.  Meds: Current Outpatient Medications  Medication Sig Dispense Refill  . aspirin 81 MG chewable tablet Chew by mouth.     No current facility-administered medications for this encounter.     Physical Findings:  weight is 157 lb 12.8 oz (71.6 kg). His oral temperature is 97.9 F (36.6 C). His blood pressure is 117/84 and his pulse is 101 (abnormal). His oxygen saturation is 99%. .    General: Alert and  oriented, in no acute distress HEENT: Head is normocephalic. Extraocular movements are intact. Oropharynx is clear. Oral mucosa is dry. He has two teeth on lower jaw. Otherwise he is edentulous. Neck: Neck is supple, no palpable cervical or supraclavicular lymphadenopathy. Heart: Regular in rate and rhythm with no murmurs, rubs, or gallops. Chest: Clear to auscultation bilaterally, with no rhonchi, wheezes, or rales. Abdomen: Soft, nontender, nondistended, with no rigidity or guarding.  Extremities: No cyanosis or edema. Lymphatics: see Neck Exam Skin: No concerning lesions. Musculoskeletal: symmetric strength and muscle tone throughout. Neurologic Speech is fluent. Coordination is intact. Psychiatric: Judgment and insight are intact. Affect is appropriate.  Lab Findings: Lab Results  Component Value Date   WBC 4.3 12/25/2017   HGB 11.0 (L) 11/27/2017   HCT 33.5 (L) 12/25/2017   MCV 96.7 12/25/2017   PLT 290 12/25/2017   / Lab Results  Component Value Date   TSH 2.431 07/23/2017    Radiographic Findings reviewed by me: Nm Pet Image Restag (ps) Skull Base To Thigh  Result Date: 12/24/2017 CLINICAL DATA:  Subsequent treatment strategy for diffuse large B-cell lymphoma. Treatment ending 12/18. Status post chemo/immunotherapy. EXAM: NUCLEAR MEDICINE PET SKULL BASE TO THIGH TECHNIQUE: 7.8 mCi F-18 FDG was injected intravenously. Full-ring PET imaging was performed from the skull base to thigh after the radiotracer. CT data was obtained and used for attenuation correction and anatomic localization. FASTING BLOOD GLUCOSE:  Value: 95 mg/dl COMPARISON:  09/03/2017 PET.  Chest CT 10/23/2017 FINDINGS: NECK: Resolution of low right jugular/supraclavicular nodal hypermetabolism. Decrease to resolution of adenopathy in this  area. Difficult to differentiate from adjacent vasculature. Mucosal thickening of bilateral maxillary sinuses. CHEST: Right suprahilar hypermetabolism measures a S.U.V. max of  4.0 including on image 71/series 4. Compare similar on the prior. Right mediastinal hypermetabolism is along the course of the Port-A-Cath likely due to radiopharmaceutical on catheter tip. The AP window node measures 10 mm a S.U.V. max of 3.2 versus 11 mm and a S.U.V. max of 3.9 Tortuous thoracic aorta. Ascending aortic dilatation at 4.4 cm on image 80/series 4. Felt to be similar to 4.3 cm on the prior. Mild cardiomegaly with LAD coronary artery atherosclerosis. Interstitial lung disease, suboptimally evaluated. Subpleural and basilar predominant reticulation. This may be slightly progressive. Calcified right pleural plaques. ABDOMEN/PELVIS: No abdominopelvic nodal or parenchymal hypermetabolism identified. Mild left adrenal thickening. Abdominal aortic atherosclerosis. Mild prostatomegaly. SKELETON: No abnormal marrow activity. Right femoral neck bone island. IMPRESSION: 1. Mild response to therapy. Resolution of cervical nodal hypermetabolism with similar and slightly improved thoracic nodal hypermetabolism. ([place] 4). No subdiaphragmatic disease identified. 2. Interstitial lung disease, suboptimally evaluated. This may be progressive. Consider dedicated high-resolution chest CT. 3. Similar ascending aortic aneurysm. Recommend annual imaging followup by CTA or MRA. This recommendation follows 2010 ACCF/AHA/AATS/ACR/ASA/SCA/SCAI/SIR/STS/SVM Guidelines for the Diagnosis and Management of Patients with Thoracic Aortic Disease. Circulation. 2010; 121: [id number] Electronically Signed   By: Abigail Miyamoto M.D.   On: 12/24/2017 12:36    Impression/Plan:  Stage IIA DLBCL of the neck/chest  We discussed the potential risks, benefits, and side effects of radiotherapy. We talked in detail about acute and late effects. We discussed that some of the most bothersome acute effects may be mucositis, dysgeusia, salivary changes, skin irritation, hair loss,   and fatigue. We talked about late effects which were also  discussed in previous consent session at consult. No guarantees of treatment were given. He is enthusiastic to proceed. The patient will proceed with CT simulation later today.  Thyroid function will be checked after radiation treatment completion. The patient will then continue with annual TSH screening with his primary care physician.   The patient will be referred to [facility] clinic for supportive care.   _____________________________________   Eppie Gibson, MD   This document serves as a record of services personally performed by Eppie Gibson, MD. It was created on her behalf by Arlyce Harman, a trained medical scribe. The creation of this record is based on the scribe's personal observations and the provider's statements to them. This document has been checked and approved by the attending provider.

## 2017-12-30 NOTE — Progress Notes (Signed)
Pt here today for a follow up appointment for lymphoma. Pt denies having any pain. Pt denies having any fatigue. Pt denies having any dysphasia or odynophagia. Pt states that he has dry mouth. Pt denies having thick saliva. Pt states that about 80% of his taste has returned since chemotherapy treatment. Pt denies having any mouth ulcers. Pt does not have a peg tube. Pt states that he has a peg tube. Pt states that he does not have any skin changes. Pt states that he lost about 3 pounds.    Blood pressure standing: 119/91 pulse 117 BP 117/84 (BP Location: Right Arm)   Pulse (!) 101   Temp 97.9 F (36.6 C) (Oral)   Wt 157 lb 12.8 oz (71.6 kg)   SpO2 99%   BMI 24.71 kg/m   Wt Readings from Last 3 Encounters:  12/30/17 157 lb 12.8 oz (71.6 kg)  12/25/17 158 lb 4.8 oz (71.8 kg)  11/27/17 161 lb 6.4 oz (73.2 kg)

## 2017-12-30 NOTE — Progress Notes (Signed)
error 

## 2017-12-31 ENCOUNTER — Encounter: Payer: Self-pay | Admitting: General Practice

## 2017-12-31 NOTE — Progress Notes (Signed)
Oncology Nurse Navigator Documentation  Met with Mr. Bellerose during f/u with Dr. Isidore Moos.  He was accompanied by his wife and dtr.  Showed him again Aquaplast mask, explained purpose.  Reinforced Dr. Pearlie Oyster explanation of RT SEs.  He voiced understanding of RT plan of daily tmts to upper chest and neck for 4 weeks.  We discussed his attendance at the 2/12 [facility] [place], they agreed, understand I will follow-up with additional information.  Accompanied him to CT [facility] for RT planning.  He tolerated procedure wo/ difficulty.  Toured him and family to [place] area, explained arrival and preparation procedures.  They voiced understanding.  They were encouraged to call me with questions/concerns prior to 1/31 RT start.  Gayleen Orem, RN, BSN Head & Neck Oncology Nurse [place] at [place] [phone]

## 2017-12-31 NOTE — Progress Notes (Signed)
[place] CSW Progress Note  Referral received from MD, CSW called contact # on facesheet, patient not available.  Requested call back in "afternoon."  CSW will attempt to reach patient as well as clarifying concern that need to be addressed by contacting treatment team.  Edwyna Shell, [place] Worker Phone:  [phone]

## 2018-01-06 ENCOUNTER — Ambulatory Visit (HOSPITAL_COMMUNITY)
Admission: RE | Admit: 2018-01-06 | Discharge: 2018-01-06 | Disposition: A | Discharge status: Home / Self Care | Payer: Medicare Other | Source: Ambulatory Visit | Attending: Radiation Oncology | Admitting: Radiation Oncology

## 2018-01-06 DIAGNOSIS — Z7982 Long term (current) use of aspirin: Secondary | ICD-10-CM | POA: Diagnosis not present

## 2018-01-06 DIAGNOSIS — C8331 Diffuse large B-cell lymphoma, lymph nodes of head, face, and neck: Secondary | ICD-10-CM | POA: Diagnosis not present

## 2018-01-06 DIAGNOSIS — J449 Chronic obstructive pulmonary disease, unspecified: Secondary | ICD-10-CM | POA: Diagnosis not present

## 2018-01-06 DIAGNOSIS — Z9221 Personal history of antineoplastic chemotherapy: Secondary | ICD-10-CM | POA: Diagnosis not present

## 2018-01-06 DIAGNOSIS — Z510 Encounter for antineoplastic radiation therapy: Secondary | ICD-10-CM | POA: Diagnosis not present

## 2018-01-06 DIAGNOSIS — C8338 Diffuse large B-cell lymphoma, lymph nodes of multiple sites: Secondary | ICD-10-CM | POA: Diagnosis not present

## 2018-01-06 DIAGNOSIS — J849 Interstitial pulmonary disease, unspecified: Secondary | ICD-10-CM | POA: Diagnosis not present

## 2018-01-06 DIAGNOSIS — C8332 Diffuse large B-cell lymphoma, intrathoracic lymph nodes: Secondary | ICD-10-CM | POA: Insufficient documentation

## 2018-01-06 LAB — PULMONARY FUNCTION TEST
DL/VA % pred: 75 %
DL/VA: 3.34 ml/min/mmHg/L
DLCO COR % PRED: 40 %
DLCO COR: 11.49 ml/min/mmHg
DLCO UNC: 10.08 ml/min/mmHg
DLCO unc % pred: 35 %
FEF 25-75 POST: 2.98 L/s
FEF 25-75 Pre: 1.51 L/sec
FEF2575-%Change-Post: 97 %
FEF2575-%PRED-POST: 149 %
FEF2575-%PRED-PRE: 75 %
FEV1-%CHANGE-POST: 16 %
FEV1-%PRED-POST: 74 %
FEV1-%Pred-Pre: 64 %
FEV1-POST: 2.05 L
FEV1-Pre: 1.76 L
FEV1FVC-%Change-Post: 3 %
FEV1FVC-%PRED-PRE: 105 %
FEV6-%Change-Post: 12 %
FEV6-%Pred-Post: 71 %
FEV6-%Pred-Pre: 63 %
FEV6-POST: 2.53 L
FEV6-Pre: 2.24 L
FEV6FVC-%CHANGE-POST: 0 %
FEV6FVC-%PRED-POST: 106 %
FEV6FVC-%Pred-Pre: 105 %
FVC-%CHANGE-POST: 12 %
FVC-%PRED-PRE: 60 %
FVC-%Pred-Post: 67 %
FVC-POST: 2.56 L
FVC-PRE: 2.27 L
POST FEV1/FVC RATIO: 80 %
PRE FEV1/FVC RATIO: 77 %
PRE FEV6/FVC RATIO: 98 %
Post FEV6/FVC ratio: 99 %
RV % pred: 76 %
RV: 1.82 L
TLC % PRED: 63 %
TLC: 4.11 L

## 2018-01-06 MED ORDER — ALBUTEROL SULFATE (2.5 MG/3ML) 0.083% IN NEBU
2.5000 mg | INHALATION_SOLUTION | Freq: Once | RESPIRATORY_TRACT | Status: AC
  Administered 2018-01-06: 2.5 mg via RESPIRATORY_TRACT

## 2018-01-07 ENCOUNTER — Encounter: Payer: Self-pay | Admitting: *Deleted

## 2018-01-07 ENCOUNTER — Ambulatory Visit: Payer: Medicare Other | Admitting: Radiation Oncology

## 2018-01-07 ENCOUNTER — Ambulatory Visit
Admission: RE | Admit: 2018-01-07 | Discharge: 2018-01-07 | Disposition: A | Discharge status: Home / Self Care | Payer: Medicare Other | Source: Ambulatory Visit | Attending: Radiation Oncology | Admitting: Radiation Oncology

## 2018-01-07 DIAGNOSIS — C8331 Diffuse large B-cell lymphoma, lymph nodes of head, face, and neck: Secondary | ICD-10-CM | POA: Diagnosis not present

## 2018-01-07 DIAGNOSIS — Z7982 Long term (current) use of aspirin: Secondary | ICD-10-CM | POA: Diagnosis not present

## 2018-01-07 DIAGNOSIS — C8338 Diffuse large B-cell lymphoma, lymph nodes of multiple sites: Secondary | ICD-10-CM | POA: Diagnosis not present

## 2018-01-07 DIAGNOSIS — Z510 Encounter for antineoplastic radiation therapy: Secondary | ICD-10-CM | POA: Diagnosis not present

## 2018-01-07 DIAGNOSIS — J849 Interstitial pulmonary disease, unspecified: Secondary | ICD-10-CM | POA: Diagnosis not present

## 2018-01-07 DIAGNOSIS — Z9221 Personal history of antineoplastic chemotherapy: Secondary | ICD-10-CM | POA: Diagnosis not present

## 2018-01-07 NOTE — Progress Notes (Signed)
To provide support, encouragement and care continuity, met with Todd Bell for his initial RT.  He was accompanied by his wife.  I reviewed the 2-step treatment process, answered questions.   His wife observed the treatment, physicist Orene Desanctis explained.  [name] expressed appreciation for the learning opportunity.  [name] completed treatment without difficulty, denied questions/concerns.  I reviewed the registration/arrival procedure for subsequent treatments.  I encouraged them to call me with questions/concerns as tmts proceed.  Todd Orem, RN, BSN Head & Neck Oncology Nurse [place] at [place] [phone]

## 2018-01-08 ENCOUNTER — Ambulatory Visit
Admission: RE | Admit: 2018-01-08 | Discharge: 2018-01-08 | Disposition: A | Discharge status: Home / Self Care | Payer: Medicare Other | Source: Ambulatory Visit | Attending: Radiation Oncology | Admitting: Radiation Oncology

## 2018-01-08 ENCOUNTER — Ambulatory Visit: Payer: Medicare Other

## 2018-01-08 DIAGNOSIS — C8331 Diffuse large B-cell lymphoma, lymph nodes of head, face, and neck: Secondary | ICD-10-CM | POA: Diagnosis not present

## 2018-01-08 DIAGNOSIS — Z510 Encounter for antineoplastic radiation therapy: Secondary | ICD-10-CM | POA: Insufficient documentation

## 2018-01-08 DIAGNOSIS — C8338 Diffuse large B-cell lymphoma, lymph nodes of multiple sites: Secondary | ICD-10-CM | POA: Insufficient documentation

## 2018-01-11 ENCOUNTER — Ambulatory Visit: Payer: Medicare Other

## 2018-01-11 ENCOUNTER — Ambulatory Visit
Admission: RE | Admit: 2018-01-11 | Discharge: 2018-01-11 | Disposition: A | Discharge status: Home / Self Care | Payer: Medicare Other | Source: Ambulatory Visit | Attending: Radiation Oncology | Admitting: Radiation Oncology

## 2018-01-11 DIAGNOSIS — C8331 Diffuse large B-cell lymphoma, lymph nodes of head, face, and neck: Secondary | ICD-10-CM | POA: Diagnosis not present

## 2018-01-11 DIAGNOSIS — C8338 Diffuse large B-cell lymphoma, lymph nodes of multiple sites: Secondary | ICD-10-CM | POA: Diagnosis not present

## 2018-01-11 DIAGNOSIS — Z510 Encounter for antineoplastic radiation therapy: Secondary | ICD-10-CM | POA: Diagnosis not present

## 2018-01-11 MED ORDER — SONAFINE EX EMUL
1.0000 "application " | Freq: Two times a day (BID) | CUTANEOUS | Status: DC
  Administered 2018-01-11: 1 via TOPICAL

## 2018-01-11 NOTE — Addendum Note (Signed)
Encounter addended by: Sherrlyn Hock, LPN on: 12/11/7090 9:57 PM  Actions taken: MAR administration accepted

## 2018-01-11 NOTE — Progress Notes (Signed)
Pt here for patient teaching.  Pt given Managing Acute Radiation Side Effects for Head and Neck Cancer handout, skin care instructions and Sonafine.  Reviewed areas of pertinence such as fatigue, hair loss, mouth changes, skin changes, throat changes and taste changes . Pt able to give teach back of drink plenty of water,apply Sonafine bid and avoid applying anything to skin within 4 hours of treatment. Pt verbalizes understanding of information given and will contact nursing with any questions or concerns.     [email or web address]

## 2018-01-12 ENCOUNTER — Ambulatory Visit
Admission: RE | Admit: 2018-01-12 | Discharge: 2018-01-12 | Disposition: A | Discharge status: Home / Self Care | Payer: Medicare Other | Source: Ambulatory Visit | Attending: Radiation Oncology | Admitting: Radiation Oncology

## 2018-01-12 ENCOUNTER — Ambulatory Visit: Payer: Medicare Other

## 2018-01-12 DIAGNOSIS — Z510 Encounter for antineoplastic radiation therapy: Secondary | ICD-10-CM | POA: Diagnosis not present

## 2018-01-12 DIAGNOSIS — C8338 Diffuse large B-cell lymphoma, lymph nodes of multiple sites: Secondary | ICD-10-CM | POA: Diagnosis not present

## 2018-01-12 DIAGNOSIS — C8331 Diffuse large B-cell lymphoma, lymph nodes of head, face, and neck: Secondary | ICD-10-CM | POA: Diagnosis not present

## 2018-01-13 ENCOUNTER — Ambulatory Visit: Payer: Medicare Other

## 2018-01-13 ENCOUNTER — Ambulatory Visit
Admission: RE | Admit: 2018-01-13 | Discharge: 2018-01-13 | Disposition: A | Discharge status: Home / Self Care | Payer: Medicare Other | Source: Ambulatory Visit | Attending: Radiation Oncology | Admitting: Radiation Oncology

## 2018-01-13 DIAGNOSIS — C8331 Diffuse large B-cell lymphoma, lymph nodes of head, face, and neck: Secondary | ICD-10-CM | POA: Diagnosis not present

## 2018-01-13 DIAGNOSIS — Z510 Encounter for antineoplastic radiation therapy: Secondary | ICD-10-CM | POA: Diagnosis not present

## 2018-01-13 DIAGNOSIS — C8338 Diffuse large B-cell lymphoma, lymph nodes of multiple sites: Secondary | ICD-10-CM | POA: Diagnosis not present

## 2018-01-14 ENCOUNTER — Ambulatory Visit: Payer: Medicare Other

## 2018-01-14 ENCOUNTER — Ambulatory Visit
Admission: RE | Admit: 2018-01-14 | Discharge: 2018-01-14 | Disposition: A | Discharge status: Home / Self Care | Payer: Medicare Other | Source: Ambulatory Visit | Attending: Radiation Oncology | Admitting: Radiation Oncology

## 2018-01-14 DIAGNOSIS — C8338 Diffuse large B-cell lymphoma, lymph nodes of multiple sites: Secondary | ICD-10-CM | POA: Diagnosis not present

## 2018-01-14 DIAGNOSIS — C8331 Diffuse large B-cell lymphoma, lymph nodes of head, face, and neck: Secondary | ICD-10-CM | POA: Diagnosis not present

## 2018-01-14 DIAGNOSIS — Z510 Encounter for antineoplastic radiation therapy: Secondary | ICD-10-CM | POA: Diagnosis not present

## 2018-01-15 ENCOUNTER — Telehealth: Payer: Self-pay | Admitting: *Deleted

## 2018-01-15 ENCOUNTER — Ambulatory Visit
Admission: RE | Admit: 2018-01-15 | Discharge: 2018-01-15 | Disposition: A | Discharge status: Home / Self Care | Payer: Medicare Other | Source: Ambulatory Visit | Attending: Radiation Oncology | Admitting: Radiation Oncology

## 2018-01-15 ENCOUNTER — Ambulatory Visit: Payer: Medicare Other

## 2018-01-15 DIAGNOSIS — C8338 Diffuse large B-cell lymphoma, lymph nodes of multiple sites: Secondary | ICD-10-CM | POA: Diagnosis not present

## 2018-01-15 DIAGNOSIS — C8331 Diffuse large B-cell lymphoma, lymph nodes of head, face, and neck: Secondary | ICD-10-CM | POA: Diagnosis not present

## 2018-01-15 DIAGNOSIS — Z510 Encounter for antineoplastic radiation therapy: Secondary | ICD-10-CM | POA: Diagnosis not present

## 2018-01-15 NOTE — Progress Notes (Signed)
[name]:  Assessment: Diffuse large B-cell lymphoma of lymph nodes of neck ([place]) 72 y.o. male with diagnosis of activated B-cell diffuse large B-cell lymphoma of the lymph nodes of the right side of the neck. Currently available staging information suggests presence of stage II bulky disease. Based on the diagnosis, patient was started on R-CHOP chemotherapy. There has been significant reduction in the bulk of the lymphoma by examination. Interval PET/CT demonstrates strong partial response, although we still see an area of increased FDG uptake in the main disease focus.  Patient has completed systemic chemoimmunotherapy with partial disease response.  Patient has persistent FDG uptake at Deauville 4 level consistent with possible persistence of malignancy.  Strongly recommend adjuvant radiation therapy due to localized nature of the disease.  Plan: -Consult Dr. Isidore Moos for radiation therapy -Return to my clinic 2 weeks after completion of radiation treatment -Disease assessment with PET/CT following completion of radiation therapy at the discretion of Dr. Isidore Moos.  Voice recognition software was used and creation of this note. Despite my best effort at editing the text, some misspelling/errors may have occurred.  Orders Placed This Encounter  Procedures  . CBC with Differential (Cancer Center Only)    Standing Status:   Future    Standing Expiration Date:   12/25/2018  . CMP ([facility] only)    Standing Status:   Future    Standing Expiration Date:   12/25/2018  . Lactate dehydrogenase (LDH)    Standing Status:   Future    Standing Expiration Date:   12/25/2018  . Magnesium    Standing Status:   Future    Standing Expiration Date:   12/25/2018  . Phosphorus    Standing Status:   Future    Standing Expiration Date:   12/25/2018  . Uric acid    Standing Status:   Future    Standing Expiration Date:   12/25/2018    Cancer Staging Diffuse large  B-cell lymphoma of lymph nodes of neck (HCC) Staging form: Hodgkin and Non-Hodgkin Lymphoma, AJCC 8th Edition - Clinical stage from 07/13/2017: Stage II bulky (Diffuse large B-cell lymphoma) - Signed by Ardath Sax, MD on 07/20/2017   All questions were answered.  . The patient knows to call the clinic with any problems, questions or concerns.  This note was electronically signed.    History of Presenting Illness Todd Bell is a  72 y.o. [place] male follwed in the [place] for diagnosis of diffuse large B-cell lymphoma.  Please see oncologic history below for details of the diagnosis. Patient has initially discovered asymmetry in his neck with visible mass earlier in July. He was seen by his primary care provider and subsequently directly to be evaluated by ENT. Patient underwent initially ultrasound of the neck demonstrating a large mass, subsequently he underwent CT of the neck on 06/15/17 demonstrating normal laryngeal, pharyngeal, salivary structures and a 5.0 cm supraclavicular mass on the right side. CT of the chest obtained the same time demonstrated mass, but no lymphadenopathy in the mediastinum, hilar structures, and a 0.4 cm right upper lobe nodule and a 0.5 cm left upper lobe nodule. Patient underwent an FNA on 06/17/17 which demonstrated presence of malignant cells that could not be further characterized due to insufficient sample. Initially, patient denied any significant symptoms in association with this mass. He denied any fevers, chills, night sweats. No significant weight loss. No changes to his activities of daily living or activity tolerance. Denied  changes to his appetite. Denied chest pain, shortness of breath or cough. Mr Levels had no nausea, early satiety, abdominal pain, diarrhea, or constipation. No dysuria or hematuria. Denied any cutaneous changes. Denied any vision changes, hearing changes, swallowing difficulties, any focal weakness or sensory  deficits in the face or extremities.  Patient returns to the clinic to review restaging PET/CT after completion of 6 cycles of R CHOP systemic chemotherapy.  Imaging demonstrates stable disease.  Patient is recovering well from the treatment with returning energy and appetite.  Denies any new symptoms since the last visit to the clinic.  Oncological/hematological History:   Diffuse large B-cell lymphoma of lymph nodes of neck ([place]odd Bell)   06/15/2017 Imaging    CT Neck/Chest: Normal laryngeal, pharyngeal, salivary structures and a 5.0 cm supraclavicular mass on the right side.No lymphadenopathy in the mediastinum, hilar structures, and a 0.4 cm right upper lobe nodule and a 0.5 cm left upper lobe nodule.       06/30/2017 Imaging    PET-CT: Highly hypermetabolic right conglomerate supraclavicular mass. This is accompanied by accentuated metabolic activity in the palatine tonsillar pillars, a hypermetabolic right buccinator lymph node, hypermetabolic thoracic lymph nodes including a mildly enlarged AP window lymph node and other upper normal size but mildly hypermetabolic hilar and infrahilar nodes. Appearance compatible with malignancy such as lymphoma or adenocarcinoma      07/13/2017 Pathology Results    Rt Neck mass core bx: Sheets of large lymphocytes discernible lymph node structure, consistent with diffuse large B-cell lymphoma. IHC -- positive for LCA, CD20, CD79a, PAX-5, BCL-6 & negative for CD10, CD34, BCL-2, CD30, CD138, kappa or lambda light chains      07/17/2017 Initial Diagnosis    Diffuse large B-cell lymphoma of lymph nodes of neck ([place])      07/29/2017 Procedure    Placement of left IJ power-injectable port catheter.       07/31/2017 - 11/27/2017 Chemotherapy    R-CHOP21: Rituximab 323m/m2, d1 + Cyclophosphamide 7521mm2, d1 + Doxorubicin 5047m2, d1 + Vincristine 2mg39m1 + Prednisone 60mg23mQDay, d1-5 Q21 d x6 cycles planned --Cycle #1, 07/31/17: --Cycle #2, 08/21/17: --Cycle #3,  09/11/17:  --Cycle #4, 09/2663/33/29plicated by pneumonia with unknown microorganism --Cycle #5, 11/06/17: delayed due to pneumonia --Cycle #6, 11/27/17:      09/03/2017 Imaging    PET-CT: The index nodal mass in the right supraclavicular region measures 3.5 cm and has an SUV max equal to 6.33 (Deauville Criteria 4). On the previous exam this measured 7.7 cm and had an SUV max equal to 29. The right buccinator node measures 6 mm and has an SUV max equal to 3.13 (Deauville Criteria 3). Previously 7 mm with an SUV max equal to 10.1. Mild increased FDG uptake associated with mediastinal and bilateral hilar regions. The index AP window lymph node measures 11 mm and has an SUV max equal to 3.93. (Deauville Criteria 4). Previously this measured 1.4 cm and had an SUV max of 4.9.      12/24/2017 PET scan    No evidence of new/progressive disease.  Stable Douville 4 enhancement in the right lower neck mass       Medical History: Past Medical History:  Diagnosis Date  . Cancer (HCC) [facility] Medical history non-contributory   . Non Hodgkin's lymphoma (HCC)[facility] Surgical History: Past Surgical History:  Procedure Laterality Date  . CATARACT EXTRACTION    . COLONOSCOPY  02/2016  . IR FLUORO GUIDE  PORT INSERTION LEFT  07/29/2017  . IR US GUIDE VASC ACCESS LEFT  07/29/2017  . LYMPH NODE BIOPSY Right 07/13/2017   Procedure: OPEN NECK LYMPH NODE BIOPSY RIGHT SIDE;  Surgeon: Izora Gala, MD;  Location: [facility];  Service: ENT;  Laterality: Right;    Family History: Family History  Problem Relation Age of Onset  . Cancer Father        Pancreatic    Social History: Social History   Socioeconomic History  . Marital status: Married    Spouse name: Not on file  . Number of children: Not on file  . Years of education: Not on file  . Highest education level: Not on file  Social Needs  . Financial resource strain: Not on file  . Food insecurity - worry: Not on file  . Food  insecurity - inability: Not on file  . Transportation needs - medical: Not on file  . Transportation needs - non-medical: Not on file  Occupational History  . Not on file  Tobacco Use  . Smoking status: Former Smoker    Last attempt to quit: 1988    Years since quitting: 31.1  . Smokeless tobacco: Never Used  . Tobacco comment: he was a social drinker  Substance and Sexual Activity  . Alcohol use: Yes    Comment: occasional  . Drug use: No  . Sexual activity: Not on file  Other Topics Concern  . Not on file  Social History Narrative  . Not on file    Allergies: No Known Allergies  Medications:  Current Outpatient Medications  Medication Sig Dispense Refill  . aspirin 81 MG chewable tablet Chew by mouth.     No current facility-administered medications for this visit.     Review of Systems: Review of Systems  Respiratory: Negative for chest tightness and cough.   Psychiatric/Behavioral: Negative for confusion.  All other systems reviewed and are negative.    PHYSICAL EXAMINATION Blood pressure 121/86, pulse 88, temperature 97.8 F (36.6 C), temperature source Oral, resp. rate 18, weight 158 lb 4.8 oz (71.8 kg), SpO2 98 %.  ECOG PERFORMANCE STATUS: 1 - Symptomatic but completely ambulatory  Physical Exam  Constitutional: He is oriented to person, place, and time and well-developed, well-nourished, and in no distress. Vital signs are normal. He appears not malnourished, not dehydrated and not jaundiced. He does not have a sickly appearance. No distress.  HENT:  Mouth/Throat: Uvula is midline, oropharynx is clear and moist and mucous membranes are normal. Mucous membranes are not pale, not dry and not cyanotic. He has dentures. Normal dentition.  Eyes: Conjunctivae and EOM are normal. Pupils are equal, round, and reactive to light. No scleral icterus.  Neck: No JVD present. Carotid bruit is not present.  Cardiovascular: Normal rate, regular rhythm and normal heart  sounds.  No murmur heard. Pulmonary/Chest: He has no decreased breath sounds. He has no rhonchi.  Significant interval improvement in the breath sounds bilaterally.  No expiratory wheezing, good bilateral aeration.  Abdominal: Soft. Normal appearance and normal aorta. There is no hepatosplenomegaly. There is no tenderness.  Lymphadenopathy:       Head (right side): No submental, no submandibular, no tonsillar and no occipital adenopathy present.       Head (left side): No submental, no submandibular, no tonsillar and no occipital adenopathy present.    He has no cervical adenopathy.       Right cervical: No posterior cervical adenopathy present.  Left cervical: No superficial cervical, no deep cervical and no posterior cervical adenopathy present.    He has no axillary adenopathy.       Right: No inguinal and no supraclavicular adenopathy present.       Left: No inguinal and no supraclavicular adenopathy present.  Complete resolution of the previously palpable bulky mass in the right lower neck/supraclavicular fossa.  Neurological: He is alert and oriented to person, place, and time. He has normal sensation, normal strength, normal reflexes and intact cranial nerves.  Skin: He is not diaphoretic.     LABORATORY DATA: I have personally reviewed the data as listed: Appointment on 12/25/2017  Component Date Value Ref Range Status  . LDH 12/25/2017 266* 125 - 245 U/L Final   Performed at [facility], [place] [address], [place], [place] [address]  . Sodium 12/25/2017 141  136 - 145 mmol/L Final  . Potassium 12/25/2017 3.8  3.5 - 5.1 mmol/L Final  . Chloride 12/25/2017 107  98 - 109 mmol/L Final  . CO2 12/25/2017 26  22 - 29 mmol/L Final  . Glucose, Bld 12/25/2017 100  70 - 140 mg/dL Final  . BUN 12/25/2017 14  7 - 26 mg/dL Final  . Creatinine, Ser 12/25/2017 0.77  0.70 - 1.30 mg/dL Final  . Calcium 12/25/2017 8.6  8.4 - 10.4 mg/dL Final  . Total Protein  12/25/2017 7.2  6.4 - 8.3 g/dL Final  . Albumin 12/25/2017 3.4* 3.5 - 5.0 g/dL Final  . AST 12/25/2017 22  5 - 34 U/L Final  . ALT 12/25/2017 18  0 - 55 U/L Final  . Alkaline Phosphatase 12/25/2017 71  40 - 150 U/L Final  . Total Bilirubin 12/25/2017 0.3  0.2 - 1.2 mg/dL Final  . GFR calc non Af Amer 12/25/2017 >60  >60 mL/min Final  . GFR calc Af Amer 12/25/2017 >60  >60 mL/min Final   Comment: (NOTE) The eGFR has been calculated using the CKD EPI equation. This calculation has not been validated in all clinical situations. eGFR's persistently <60 mL/min signify possible Chronic Kidney Disease.   Georgiann Hahn gap 12/25/2017 8  3 - 11 Final   Performed at [facility], [place] [address], [place],  [address]  . WBC Count 12/25/2017 4.3  4.0 - 10.3 K/uL Final  . RBC 12/25/2017 3.47* 4.20 - 5.82 MIL/uL Final  . Hemoglobin 12/25/2017 10.9* 13.0 - 17.1 g/dL Final  . HCT 12/25/2017 33.5* 38.4 - 49.9 % Final  . MCV 12/25/2017 96.7  79.3 - 98.0 fL Final  . MCH 12/25/2017 31.6  27.2 - 33.4 pg Final  . MCHC 12/25/2017 32.6  32.0 - 36.0 g/dL Final  . RDW 12/25/2017 19.4* 11.0 - 15.6 % Final  . Platelet Count 12/25/2017 290  140 - 400 K/uL Final  . Neutrophils Relative % 12/25/2017 50  % Final  . Neutro Abs 12/25/2017 2.1  1.5 - 6.5 K/uL Final  . Lymphocytes Relative 12/25/2017 17  % Final  . Lymphs Abs 12/25/2017 0.8* 0.9 - 3.3 K/uL Final  . Monocytes Relative 12/25/2017 20  % Final  . Monocytes Absolute 12/25/2017 0.9  0.1 - 0.9 K/uL Final  . Eosinophils Relative 12/25/2017 12  % Final  . Eosinophils Absolute 12/25/2017 0.5  0.0 - 0.5 K/uL Final  . Basophils Relative 12/25/2017 1  % Final  . Basophils Absolute 12/25/2017 0.1  0.0 - 0.1 K/uL Final   Performed at [facility], [place] [address] [place], [place] [address]  Hospital Outpatient Visit on 12/24/2017  Component Date Value Ref Range Status  . Glucose-Capillary 12/24/2017 95  65 - 99  mg/dL Final       Ardath Sax, MD

## 2018-01-15 NOTE — Telephone Encounter (Signed)
Oncology Nurse Navigator Documentation  Returned pt wife's VMM, provided clarification re Tuesday morning's H&N [facility] appt, including 0800 arrival to Radiation Waiting following lobby registration.  She voiced understanding.  Gayleen Orem, RN, BSN Head & Neck Oncology Nurse [place] at [place] [phone]

## 2018-01-15 NOTE — Assessment & Plan Note (Signed)
72 y.o. male with diagnosis of activated B-cell diffuse large B-cell lymphoma of the lymph nodes of the right side of the neck. Currently available staging information suggests presence of stage II bulky disease. Based on the diagnosis, patient was started on R-CHOP chemotherapy. There has been significant reduction in the bulk of the lymphoma by examination. Interval PET/CT demonstrates strong partial response, although we still see an area of increased FDG uptake in the main disease focus.  Patient has completed systemic chemoimmunotherapy with partial disease response.  Patient has persistent FDG uptake at Deauville 4 level consistent with possible persistence of malignancy.  Strongly recommend adjuvant radiation therapy due to localized nature of the disease.  Plan: -Consult Dr. Isidore Moos for radiation therapy -Return to my clinic 2 weeks after completion of radiation treatment -Disease assessment with PET/CT following completion of radiation therapy at the discretion of Dr. Isidore Moos.

## 2018-01-18 ENCOUNTER — Ambulatory Visit: Payer: Medicare Other

## 2018-01-18 ENCOUNTER — Ambulatory Visit
Admission: RE | Admit: 2018-01-18 | Discharge: 2018-01-18 | Disposition: A | Discharge status: Home / Self Care | Payer: Medicare Other | Source: Ambulatory Visit | Attending: Radiation Oncology | Admitting: Radiation Oncology

## 2018-01-18 ENCOUNTER — Other Ambulatory Visit: Payer: Self-pay | Admitting: Radiation Oncology

## 2018-01-18 DIAGNOSIS — C8338 Diffuse large B-cell lymphoma, lymph nodes of multiple sites: Secondary | ICD-10-CM | POA: Diagnosis not present

## 2018-01-18 DIAGNOSIS — C8331 Diffuse large B-cell lymphoma, lymph nodes of head, face, and neck: Secondary | ICD-10-CM | POA: Diagnosis not present

## 2018-01-18 DIAGNOSIS — Z510 Encounter for antineoplastic radiation therapy: Secondary | ICD-10-CM | POA: Diagnosis not present

## 2018-01-18 MED ORDER — LIDOCAINE VISCOUS 2 % MT SOLN: OROMUCOSAL | 5 refills | Status: AC

## 2018-01-19 ENCOUNTER — Ambulatory Visit
Admission: RE | Admit: 2018-01-19 | Discharge: 2018-01-19 | Disposition: A | Discharge status: Home / Self Care | Payer: Medicare Other | Source: Ambulatory Visit | Attending: Radiation Oncology | Admitting: Radiation Oncology

## 2018-01-19 ENCOUNTER — Encounter: Payer: Self-pay | Admitting: Radiation Oncology

## 2018-01-19 ENCOUNTER — Ambulatory Visit: Payer: Medicare Other | Attending: Hematology

## 2018-01-19 ENCOUNTER — Encounter: Payer: Self-pay | Admitting: *Deleted

## 2018-01-19 ENCOUNTER — Other Ambulatory Visit: Payer: Self-pay

## 2018-01-19 ENCOUNTER — Ambulatory Visit: Payer: Medicare Other

## 2018-01-19 ENCOUNTER — Inpatient Hospital Stay: Payer: Medicare Other | Attending: Hematology and Oncology | Admitting: Nutrition

## 2018-01-19 ENCOUNTER — Ambulatory Visit: Payer: Medicare Other | Admitting: Physical Therapy

## 2018-01-19 VITALS — BP 105/85 | HR 79 | Temp 97.8°F | Resp 18 | Wt 155.8 lb

## 2018-01-19 DIAGNOSIS — C8510 Unspecified B-cell lymphoma, unspecified site: Secondary | ICD-10-CM | POA: Diagnosis not present

## 2018-01-19 DIAGNOSIS — C8338 Diffuse large B-cell lymphoma, lymph nodes of multiple sites: Secondary | ICD-10-CM | POA: Diagnosis not present

## 2018-01-19 DIAGNOSIS — Z9189 Other specified personal risk factors, not elsewhere classified: Secondary | ICD-10-CM | POA: Diagnosis not present

## 2018-01-19 DIAGNOSIS — R1310 Dysphagia, unspecified: Secondary | ICD-10-CM | POA: Diagnosis not present

## 2018-01-19 DIAGNOSIS — R29898 Other symptoms and signs involving the musculoskeletal system: Secondary | ICD-10-CM | POA: Insufficient documentation

## 2018-01-19 DIAGNOSIS — Z510 Encounter for antineoplastic radiation therapy: Secondary | ICD-10-CM | POA: Diagnosis not present

## 2018-01-19 DIAGNOSIS — C8331 Diffuse large B-cell lymphoma, lymph nodes of head, face, and neck: Secondary | ICD-10-CM | POA: Diagnosis not present

## 2018-01-19 MED ORDER — SONAFINE EX EMUL
1.0000 "application " | Freq: Two times a day (BID) | CUTANEOUS | Status: DC
  Administered 2018-01-19: 1 via TOPICAL

## 2018-01-19 NOTE — Progress Notes (Signed)
Financial Counseling--spoke with patients wife today--gave her information about our granst and addressed questions she had about insurance

## 2018-01-19 NOTE — Therapy (Signed)
[facility] [address] [place], [place], [address] Phone: [phone]   Fax:  [phone]  Speech Language Pathology Evaluation  Patient Details  Name: Todd Bell MRN: [id number] Date of Birth: December 30, 1945 Referring Provider: Eppie Gibson, MD   Encounter Date: 01/19/2018  End of Session - 01/19/18 1127    Visit Number  1    Number of Visits  3    Date for SLP Re-Evaluation  04/02/18    SLP Start Time  0824    SLP Stop Time   0905    SLP Time Calculation (min)  41 min    Activity Tolerance  Patient tolerated treatment well       Past Medical History:  Diagnosis Date  . Cancer ([place])   . Medical history non-contributory   . Non Hodgkin's lymphoma [facility])     Past Surgical History:  Procedure Laterality Date  . CATARACT EXTRACTION    . COLONOSCOPY  02/2016  . IR FLUORO GUIDE PORT INSERTION LEFT  07/29/2017  . IR US GUIDE VASC ACCESS LEFT  07/29/2017  . LYMPH NODE BIOPSY Right 07/13/2017   Procedure: OPEN NECK LYMPH NODE BIOPSY RIGHT SIDE;  Surgeon: Izora Gala, MD;  Location: [facility];  Service: ENT;  Laterality: Right;    There were no vitals filed for this visit.  Subjective Assessment - 01/19/18 0836    Subjective  Pt denies current dysphagia.    Patient is accompained by:  Family member wife    Currently in Pain?  No/denies         SLP Evaluation [facility] - 01/19/18 7026      SLP Visit Information   SLP Received On  01/19/18    Referring Provider  Eppie Gibson, MD    Onset Date  Began RT 01-07-18    Medical Diagnosis  Large B-cell lymphoma of lymph nodes rt neck      General Information   HPI  Pt is 72 y.o. male with diffuse large B cell lyphoma of lymph nodes of rt neck. he completed 6 cycles of R-CHOP chemo on 11-27-17 with partial response. Began 01-07-18 rad tx (24 fractions to chest, 18 fractions to rt neck). Pt will not be receiving PEG.     Behavioral/Cognition  Pt is  overly verbose.      Balance Screen   Has the patient fallen in the past 6 months  No      Prior Functional Status   Cognitive/Linguistic Baseline  Within functional limits      Cognition   Overall Cognitive Status  Within Functional Limits for tasks assessed      Verbal Expression   Overall Verbal Expression  Appears within functional limits for tasks assessed      Oral Motor/Sensory Function   Overall Oral Motor/Sensory Function  Appears within functional limits for tasks assessed      Motor Speech   Overall Motor Speech  Appears within functional limits for tasks assessed      Pt currently tolerates regular diet with thin liquids. Dentures are ill-fitting so pt has mostly softer foods currently, but denies overt s/s aspiration during meals.  POs: Pt ate [place] sandwich and drank H2O without overt s/s aspiration. Thyroid elevation appeared WNL, and swallows appeared timely. Oral residue noted as minimal. [name] swallow deemed WNL at this time.   Because data states the risk for dysphagia during and after radiation treatment is high due to undergoing radiation tx, SLP taught  pt about the possibility of reduced/limited ability for PO intake during rad tx. SLP encouraged pt to continue swallowing POs as far into rad tx as possible, even ingesting POs and/or completing HEP shortly after administration of pain meds.   SLP educated pt re: changes to swallowing musculature after rad tx, and why adherence to dysphagia HEP provided today and PO consumption was necessary to reduce muscle fibrosis following rad tx. Pt demonstrated understanding of these things to SLP.    SLP then developed a HEP for pt and pt was instructed how to perform exercises involving lingual, vocal, and pharyngeal strengthening. SLP performed each exercise and pt return demonstrated each exercise. SLP ensured pt performance was correct prior to moving on to next exercise. Pt was instructed to complete this program 2 times a  day until 6 months after his or her last rad tx, then x2-3 a week after that.                 SLP Education - 02/04/18 1127    Education provided  Yes    Education Details  HEP, late effects head/neck radiaiton on swallowing function    Person(s) Educated  Patient;Spouse    Methods  Explanation;Demonstration;Verbal cues    Comprehension  Verbalized understanding;Need further instruction;Returned demonstration;Verbal cues required       SLP Short Term Goals - 2018-02-04 1129      SLP SHORT TERM GOAL #1   Title  pt will complete HEP with occasional min A     Time  1    Period  -- visit    Status  New      SLP SHORT TERM GOAL #2   Title  pt will tell SLP why he is completing HEP with min A    Time  1    Period  -- visit    Status  New       SLP Long Term Goals - 02/04/2018 1130      SLP LONG TERM GOAL #1   Title  pt will complete HEP with rare min A    Time  2    Period  -- visits    Status  New      SLP LONG TERM GOAL #2   Title  pt will tell SLP why he is completing HEP with rare min A over two sessions    Time  2    Period  -- visits    Status  New       Plan - 02-04-18 1128    Clinical Impression Statement  Pt with oropharyngeal swallowing essentially WNL, however the probability of swallowing difficulty increases dramatically with the initiation of radiation therapy. Pt will need to be followed by SLP for regular assessment of accurate HEP completion as well as for safety with POs both during and following treatment/s.    Speech Therapy Frequency  -- once every approx 4 weeks    Duration  -- 2 therapy visits    Treatment/Interventions  Aspiration precaution training;Pharyngeal strengthening exercises;Diet toleration management by SLP;Trials of upgraded texture/liquids;Internal/external aids;Patient/family education;Compensatory strategies;SLP instruction and feedback;Cueing hierarchy;Environmental controls    Potential to Achieve Goals  Good        Patient will benefit from skilled therapeutic intervention in order to improve the following deficits and impairments:   Dysphagia, unspecified type  G-Codes - 04-Feb-2018 1131    Functional Assessment Tool Used  noms    Functional Limitations  Swallowing    Swallow Current  Status [phone])  0 percent impaired, limited or restricted    Swallow Goal Status (X3244)  At least 1 percent but less than 20 percent impaired, limited or restricted       Problem List Patient Active Problem List   Diagnosis Date Noted  . Diffuse large B-cell lymphoma of lymph nodes of neck ([place]) 07/17/2017    [facility] 01/19/2018, 11:34 AM  [facility] [address] [place], [place], [address] Phone: [phone]   Fax:  [phone]  Name: Todd Bell MRN: [id number] Date of Birth: 29-Apr-1946

## 2018-01-19 NOTE — Progress Notes (Signed)
[place] Work H&N [place]  Clinical Education officer, museum met with patient and patients wife in [facility] to offer support and assess for needs.  Patient and patient's wife did not express any needs at times time.  Patient stated he uses "zen" to process his emotions and experience with his treatment journey.  CSW provided education on CSW role on information on the [facility] support team and support services available.  CSW, patient, and patients wife discussed common feelings and concerns when going through treatment and the importance of support.  CSW provided contact information and encouraged patient to call with questions or concerns.    Johnnye Lana, MSW, LCSW, OSW-C Clinical Social Worker [facility] [phone]

## 2018-01-19 NOTE — Progress Notes (Signed)
Patient was seen during head and neck clinic.  72 year old male diagnosed with B-cell lymphoma.  He will be receiving radiation therapy.  He is status post chemotherapy.  He is a patient of Dr. Isidore Moos and Dr. Irene Limbo.  Past medical history is not significant.  Medications include lidocaine.  Labs noted albumin 3.4 on January 18  Height: 5 feet 7 inches Weight: 155.8 pounds February 12 Usual body weight: 171 pounds October 23, 2017 BMI: 24.4.  Patient has lost 9% body weight over 3 months. I was unable to obtain diet history or do nutrition focused physical exam. Patient does state weight loss was unintentional He has not been consuming any oral nutrition supplements but is willing to try them. Both patient and wife share cooking meals.  Nutrition diagnosis: Unintended weight loss related to B-cell lymphoma as evidenced by 9% weight loss over 3 months  Intervention: I educated patient and his wife of the importance of increasing calories and protein in 6 small meals and snacks daily Educated patient on sources of protein.  Wife reports patient does enjoy protein foods. Recommended patient begin one oral nutrition supplement daily and provided samples and coupons I provided fact sheets on increasing calories and protein and soft moist protein foods. Brief review of bowel regimen to avoid constipation Questions were answered and teach back method used.  Contact information was given  Monitoring evaluation goals: Patient will work to increase calories and protein to minimize further weight loss  Next visit: To be scheduled as needed with treatments.  **Disclaimer: This note was dictated with voice recognition software. Similar sounding words can inadvertently be transcribed and this note may contain transcription errors which may not have been corrected upon publication of note.**

## 2018-01-19 NOTE — Patient Instructions (Signed)
SWALLOWING EXERCISES Do these until 6 months after your last day of radiation, then 2 times per week afterwards  1. Effortful Swallows - Press your tongue against the roof of your mouth for 3 seconds, then squeeze          the muscles in your neck while you swallow your saliva or a sip of water - Repeat 20 times, 2-3 times a day, and use whenever you eat or drink  2. Masako Swallow - swallow with your tongue sticking out - Stick tongue out past your teeth and gently bite tongue with your teeth - Swallow, while holding your tongue with your teeth - Repeat 20 times, 2-3 times a day *use a wet spoon if your mouth gets dry*  3. Shaker Exercise - head lift - Lie flat on your back in your bed or on a couch without pillows - Raise your head and look at your feet - KEEP YOUR SHOULDERS DOWN - HOLD FOR 45-60 SECONDS, then lower your head back down - Repeat 3 times, 2-3 times a day  4. Mendelsohn Maneuver - "half swallow" exercise - Start to swallow, and keep your Adam's apple up by squeezing hard with the            muscles of the throat - Hold the squeeze for 5-7 seconds and then relax - Repeat 20 times, 2-3 times a day *use a wet spoon if your mouth gets dry*  5. Breath Hold - Say "HUH!" loudly, then hold your breath for 3 seconds at your voice box - Repeat 20 times, 2-3 times a day  6. Chin pushback - Open your mouth  - Place your fist UNDER your chin near your neck, and push back with your fist for 5 seconds - Repeat 10 times, 2-3 times a day

## 2018-01-19 NOTE — Therapy (Signed)
[place], [place], [address] Phone: [phone]   Fax:  [phone]  Physical Therapy Evaluation  Patient Details  Name: Todd Bell MRN: [id number] Date of Birth: May 22, 1946 Referring Provider: Dr. Eppie Gibson   Encounter Date: 01/19/2018  PT End of Session - 01/19/18 1015    Visit Number  1    Number of Visits  1    PT Start Time  0920    PT Stop Time  0953    PT Time Calculation (min)  33 min    Activity Tolerance  Patient tolerated treatment well    Behavior During Therapy  [facility] for tasks assessed/performed       Past Medical History:  Diagnosis Date  . Cancer ([place])   . Medical history non-contributory   . Non Hodgkin's lymphoma [facility])     Past Surgical History:  Procedure Laterality Date  . CATARACT EXTRACTION    . COLONOSCOPY  02/2016  . IR FLUORO GUIDE PORT INSERTION LEFT  07/29/2017  . IR US GUIDE VASC ACCESS LEFT  07/29/2017  . LYMPH NODE BIOPSY Right 07/13/2017   Procedure: OPEN NECK LYMPH NODE BIOPSY RIGHT SIDE;  Surgeon: Izora Gala, MD;  Location: [place];  Service: ENT;  Laterality: Right;    There were no vitals filed for this visit.   Subjective Assessment - 01/19/18 1004    Subjective  I walk and I lift 5 lbs., sometimes 10 lbs.    Pertinent History  Diagnosis is diffuse large B-cell lymphoma of lymph nodes of Rt. neck.  He has completed chemotherapy as of 11/27/17 with a partial response.  He began XRT 01/07/18 and will have 24 fractions to chest due to lung nodules, 18 fractions to Rt. neck. Non-smoker, non-drinker; no PEG.     Patient Stated Goals  get info from all lung clinic providers    Currently in Pain?  No/denies         [facility] PT Assessment - 01/19/18 0001      Assessment   Medical Diagnosis  diffuse large B cell lymphoma    Referring Provider  Dr. Eppie Gibson    Onset Date/Surgical Date  06/10/17    Hand Dominance  Right    Prior  Therapy  was evaluated 12/14/17 for debility from treatment, and did not need therapy at that time      Precautions   Precautions  Other (comment)    Precaution Comments  active cancer      Restrictions   Weight Bearing Restrictions  No      Balance Screen   Has the patient fallen in the past 6 months  No    Has the patient had a decrease in activity level because of a fear of falling?   No    Is the patient reluctant to leave their home because of a fear of falling?   No      Home Environment   Living Environment  Private residence    Living Arrangements  Spouse/significant other    Type of Home  Other(Comment) [place]  One level      Prior Function   Level of [place]  Retired    Leisure  walks, [employer], does yardwork      Cognition   Overall Cognitive Status  Within Functional Limits for tasks assessed      Programmer, applications  Movements are Fluid and Coordinated  Yes      Functional Tests   Functional tests  Sit to Stand      Sit to Stand   Comments  10 times in 30 seconds, a bit below average for age mild dyspnea following      Posture/Postural Control   Posture/Postural Control  Postural limitations    Postural Limitations  Forward head      ROM / Strength   AROM / PROM / Strength  AROM      AROM   Overall AROM Comments  shoulder AROM WFL bilat.    AROM Assessment Site  Cervical    Cervical Flexion  WFL    Cervical Extension  50% loss    Cervical - Right Side Bend  50% loss    Cervical - Left Side Bend  50% loss    Cervical - Right Rotation  50% loss    Cervical - Left Rotation  25% loss      Ambulation/Gait   Ambulation/Gait  Yes    Ambulation/Gait Assistance  7: Independent        LYMPHEDEMA/ONCOLOGY QUESTIONNAIRE - 01/19/18 1013      Type   Cancer Type  diffuse largeB-cell lymphoma of lymph nodes of Rt. neck      Treatment   Past Chemotherapy Treatment  Yes    Date  11/27/17    Active  Radiation Treatment  Yes    Date  01/07/18    Body Site  Rt. neck and chest (lung nodules)      Lymphedema Assessments   Lymphedema Assessments  Head and Neck      Head and Neck   4 cm superior to sternal notch around neck  37.1 cm    6 cm superior to sternal notch around neck  36.8 cm    8 cm superior to sternal notch around neck  37.6 cm          Objective measurements completed on examination: See above findings.              PT Education - 01/19/18 1014    Education provided  Yes    Education Details  neck ROM, posture, exercise, CURE article on staying active, "Why exercise?" flyer, lymphedema and PT info    Person(s) Educated  Patient;Spouse    Methods  Explanation;Handout    Comprehension  Verbalized understanding              Head and Neck Clinic Goals - 01/19/18 1021      Patient will be able to verbalize understanding of a home exercise program for cervical range of motion, posture, and walking.    Status  Achieved      Patient will be able to verbalize understanding of proper sitting and standing posture.    Status  Achieved      Patient will be able to verbalize understanding of lymphedema risk and availability of treatment for this condition.    Status  Achieved         Plan - 01/19/18 1015    Clinical Impression Statement  Pt. was evaluated in our clinic on 12/14/17 for weakness, endurance and balance issues post chemotherapy but was found to be doing well at that time.  He has now started XRT to his neck and also for lung nodules, so focus today was on lymphedema risk and education about that. He does have moderately limited neck AROM and scored a bit below average  for his age (41 rather than average of 26) on 7 second sit to stand.    Clinical Presentation  Evolving    Clinical Presentation due to:  starting radiation treatment    Clinical Decision Making  Low    Rehab Potential  Good    PT Frequency  One time visit     PT Treatment/Interventions  Patient/family education    PT Next Visit Plan  no follow-up currently planned, but he is at risk for lymphedema so may need follow-up should that develop    PT Home Exercise Plan  continue with walking, exercise bike and weight lifting    Consulted and Agree with Plan of Care  Patient       Patient will benefit from skilled therapeutic intervention in order to improve the following deficits and impairments:  Decreased knowledge of precautions, Decreased range of motion, Postural dysfunction, Decreased activity tolerance  Visit Diagnosis: Large B-cell lymphoma ([place]) - Plan: PT plan of care cert/re-cert  Other symptoms and signs involving the musculoskeletal system - Plan: PT plan of care cert/re-cert  At risk for lymphedema - Plan: PT plan of care cert/re-cert     Problem List Patient Active Problem List   Diagnosis Date Noted  . Diffuse large B-cell lymphoma of lymph nodes of neck ([place]) 07/17/2017    SALISBURY,DONNA 01/19/2018, 10:23 AM  [place], [place], [address] Phone: [phone]   Fax:  [phone]  Name: Pharell Rolfson MRN: [id number] Date of Birth: 08-25-1946  Serafina Royals, PT 01/19/18 10:23 AM

## 2018-01-20 ENCOUNTER — Ambulatory Visit
Admission: RE | Admit: 2018-01-20 | Discharge: 2018-01-20 | Disposition: A | Discharge status: Home / Self Care | Payer: Medicare Other | Source: Ambulatory Visit | Attending: Radiation Oncology | Admitting: Radiation Oncology

## 2018-01-20 ENCOUNTER — Ambulatory Visit: Payer: Medicare Other

## 2018-01-20 DIAGNOSIS — C8338 Diffuse large B-cell lymphoma, lymph nodes of multiple sites: Secondary | ICD-10-CM | POA: Diagnosis not present

## 2018-01-20 DIAGNOSIS — Z510 Encounter for antineoplastic radiation therapy: Secondary | ICD-10-CM | POA: Diagnosis not present

## 2018-01-20 DIAGNOSIS — C8331 Diffuse large B-cell lymphoma, lymph nodes of head, face, and neck: Secondary | ICD-10-CM | POA: Diagnosis not present

## 2018-01-20 NOTE — Progress Notes (Signed)
Oncology Nurse Navigator Documentation  Met with Todd Bell during H&N [place].  He was accompanied by his wife.  Provided verbal and written overview of [place], the clinicians who will be seeing him, encouraged him to ask questions during his time with them.  He was seen by Nutrition, SLP, PT, SW and [place].  Spoke with him at end of [facility], addressed questions.  Escorted him to RT. I encouraged him to call me with needs/concerns.  Gayleen Orem, RN, BSN Head & Neck Oncology [place] at [place] [phone]

## 2018-01-21 ENCOUNTER — Ambulatory Visit: Payer: Medicare Other

## 2018-01-21 ENCOUNTER — Ambulatory Visit
Admission: RE | Admit: 2018-01-21 | Discharge: 2018-01-21 | Disposition: A | Discharge status: Home / Self Care | Payer: Medicare Other | Source: Ambulatory Visit | Attending: Radiation Oncology | Admitting: Radiation Oncology

## 2018-01-21 DIAGNOSIS — Z510 Encounter for antineoplastic radiation therapy: Secondary | ICD-10-CM | POA: Diagnosis not present

## 2018-01-21 DIAGNOSIS — C8331 Diffuse large B-cell lymphoma, lymph nodes of head, face, and neck: Secondary | ICD-10-CM | POA: Diagnosis not present

## 2018-01-21 DIAGNOSIS — C8338 Diffuse large B-cell lymphoma, lymph nodes of multiple sites: Secondary | ICD-10-CM | POA: Diagnosis not present

## 2018-01-22 ENCOUNTER — Ambulatory Visit: Payer: Medicare Other

## 2018-01-22 ENCOUNTER — Ambulatory Visit
Admission: RE | Admit: 2018-01-22 | Discharge: 2018-01-22 | Disposition: A | Discharge status: Home / Self Care | Payer: Medicare Other | Source: Ambulatory Visit | Attending: Radiation Oncology | Admitting: Radiation Oncology

## 2018-01-22 DIAGNOSIS — C8331 Diffuse large B-cell lymphoma, lymph nodes of head, face, and neck: Secondary | ICD-10-CM | POA: Diagnosis not present

## 2018-01-22 DIAGNOSIS — C8338 Diffuse large B-cell lymphoma, lymph nodes of multiple sites: Secondary | ICD-10-CM | POA: Diagnosis not present

## 2018-01-22 DIAGNOSIS — Z510 Encounter for antineoplastic radiation therapy: Secondary | ICD-10-CM | POA: Diagnosis not present

## 2018-01-25 ENCOUNTER — Ambulatory Visit
Admission: RE | Admit: 2018-01-25 | Discharge: 2018-01-25 | Disposition: A | Discharge status: Home / Self Care | Payer: Medicare Other | Source: Ambulatory Visit | Attending: Radiation Oncology | Admitting: Radiation Oncology

## 2018-01-25 ENCOUNTER — Ambulatory Visit: Payer: Medicare Other

## 2018-01-25 DIAGNOSIS — C8331 Diffuse large B-cell lymphoma, lymph nodes of head, face, and neck: Secondary | ICD-10-CM | POA: Diagnosis not present

## 2018-01-25 DIAGNOSIS — C8338 Diffuse large B-cell lymphoma, lymph nodes of multiple sites: Secondary | ICD-10-CM | POA: Diagnosis not present

## 2018-01-25 DIAGNOSIS — Z510 Encounter for antineoplastic radiation therapy: Secondary | ICD-10-CM | POA: Diagnosis not present

## 2018-01-26 ENCOUNTER — Ambulatory Visit: Payer: Medicare Other

## 2018-01-26 ENCOUNTER — Ambulatory Visit
Admission: RE | Admit: 2018-01-26 | Discharge: 2018-01-26 | Disposition: A | Discharge status: Home / Self Care | Payer: Medicare Other | Source: Ambulatory Visit | Attending: Radiation Oncology | Admitting: Radiation Oncology

## 2018-01-26 ENCOUNTER — Inpatient Hospital Stay: Payer: Medicare Other

## 2018-01-26 DIAGNOSIS — C8338 Diffuse large B-cell lymphoma, lymph nodes of multiple sites: Secondary | ICD-10-CM | POA: Diagnosis not present

## 2018-01-26 DIAGNOSIS — C8331 Diffuse large B-cell lymphoma, lymph nodes of head, face, and neck: Secondary | ICD-10-CM | POA: Diagnosis not present

## 2018-01-26 DIAGNOSIS — Z510 Encounter for antineoplastic radiation therapy: Secondary | ICD-10-CM | POA: Diagnosis not present

## 2018-01-26 NOTE — Progress Notes (Signed)
Nutrition Follow-up:  Patient with B-cell lymphoma followed by Dr. Isidore Moos and Dr. Irene Limbo.  Patient is currently receiving radiation therapy and has finished chemotherapy.  Met with patient and wife prior to radiation treatment today.  Patient reports that he is eating but has taste changes and dry mouth which effect the amount of food he is able to eat.  Patient reports that he is drinking 1 ensure original per day.  Reports he likes oatmeal and ensure for breakfast.  Sometimes has egg with sausage/bacon.  Sometimes for lunch likes big mac.  Likes to go to [employer] sometimes for dinner.  Prepares traditional cuisine of vegetables, protein, rice, chicken and dumplings.  Patient reports no issues with nausea, diarrhea or trouble swallowing.  Followed by SLP.  Reports that he has been rinsing with baking soda, salt and water.  Has not used lidocaine.    Medications: reviewed  Labs: reviewed  Anthropometrics:   Reports weight yesterday in clinic was 158 lb. Last recorded weight noted on 2/12 of 155 lb 8 oz   NUTRITION DIAGNOSIS: Unintentional weight loss improved with weight gain (stated)   INTERVENTION:   Encouraged patient to continue to eat good sources of protein. Encouraged switching to higher calorie, protein shake 2 times per day to give additional calories and protein to prevent weight loss.  Patient and wife verbalized understanding.  Samples given    MONITORING, EVALUATION, GOAL: Patient will work to increase calories and protein to minimize further weight loss.   NEXT VISIT: Thursday, Feb 28. Reminded patient and wife of appointment  Rekita Miotke B. Zenia Resides, [place], [place] Registered Dietitian [phone] (pager)

## 2018-01-27 ENCOUNTER — Ambulatory Visit
Admission: RE | Admit: 2018-01-27 | Discharge: 2018-01-27 | Disposition: A | Discharge status: Home / Self Care | Payer: Medicare Other | Source: Ambulatory Visit | Attending: Radiation Oncology | Admitting: Radiation Oncology

## 2018-01-27 ENCOUNTER — Ambulatory Visit: Payer: Medicare Other

## 2018-01-27 DIAGNOSIS — Z510 Encounter for antineoplastic radiation therapy: Secondary | ICD-10-CM | POA: Diagnosis not present

## 2018-01-27 DIAGNOSIS — C8338 Diffuse large B-cell lymphoma, lymph nodes of multiple sites: Secondary | ICD-10-CM | POA: Diagnosis not present

## 2018-01-27 DIAGNOSIS — C8331 Diffuse large B-cell lymphoma, lymph nodes of head, face, and neck: Secondary | ICD-10-CM | POA: Diagnosis not present

## 2018-01-28 ENCOUNTER — Ambulatory Visit: Payer: Medicare Other

## 2018-01-28 ENCOUNTER — Ambulatory Visit
Admission: RE | Admit: 2018-01-28 | Discharge: 2018-01-28 | Disposition: A | Discharge status: Home / Self Care | Payer: Medicare Other | Source: Ambulatory Visit | Attending: Radiation Oncology | Admitting: Radiation Oncology

## 2018-01-28 DIAGNOSIS — Z510 Encounter for antineoplastic radiation therapy: Secondary | ICD-10-CM | POA: Diagnosis not present

## 2018-01-28 DIAGNOSIS — C8338 Diffuse large B-cell lymphoma, lymph nodes of multiple sites: Secondary | ICD-10-CM | POA: Diagnosis not present

## 2018-01-28 DIAGNOSIS — C8331 Diffuse large B-cell lymphoma, lymph nodes of head, face, and neck: Secondary | ICD-10-CM | POA: Diagnosis not present

## 2018-01-29 ENCOUNTER — Ambulatory Visit
Admission: RE | Admit: 2018-01-29 | Discharge: 2018-01-29 | Disposition: A | Discharge status: Home / Self Care | Payer: Medicare Other | Source: Ambulatory Visit | Attending: Radiation Oncology | Admitting: Radiation Oncology

## 2018-01-29 ENCOUNTER — Ambulatory Visit: Payer: Medicare Other

## 2018-01-29 DIAGNOSIS — C8338 Diffuse large B-cell lymphoma, lymph nodes of multiple sites: Secondary | ICD-10-CM | POA: Diagnosis not present

## 2018-01-29 DIAGNOSIS — C8331 Diffuse large B-cell lymphoma, lymph nodes of head, face, and neck: Secondary | ICD-10-CM | POA: Diagnosis not present

## 2018-01-29 DIAGNOSIS — Z510 Encounter for antineoplastic radiation therapy: Secondary | ICD-10-CM | POA: Diagnosis not present

## 2018-02-01 ENCOUNTER — Ambulatory Visit
Admission: RE | Admit: 2018-02-01 | Discharge: 2018-02-01 | Disposition: A | Discharge status: Home / Self Care | Payer: Medicare Other | Source: Ambulatory Visit | Attending: Radiation Oncology | Admitting: Radiation Oncology

## 2018-02-01 ENCOUNTER — Ambulatory Visit: Payer: Medicare Other

## 2018-02-01 DIAGNOSIS — Z510 Encounter for antineoplastic radiation therapy: Secondary | ICD-10-CM | POA: Diagnosis not present

## 2018-02-01 DIAGNOSIS — C8331 Diffuse large B-cell lymphoma, lymph nodes of head, face, and neck: Secondary | ICD-10-CM | POA: Diagnosis not present

## 2018-02-01 DIAGNOSIS — C8338 Diffuse large B-cell lymphoma, lymph nodes of multiple sites: Secondary | ICD-10-CM | POA: Diagnosis not present

## 2018-02-01 MED ORDER — SONAFINE EX EMUL
1.0000 "application " | Freq: Once | CUTANEOUS | Status: AC
  Administered 2018-02-01: 1 via TOPICAL

## 2018-02-02 ENCOUNTER — Ambulatory Visit: Payer: Medicare Other

## 2018-02-02 ENCOUNTER — Ambulatory Visit
Admission: RE | Admit: 2018-02-02 | Discharge: 2018-02-02 | Disposition: A | Discharge status: Home / Self Care | Payer: Medicare Other | Source: Ambulatory Visit | Attending: Radiation Oncology | Admitting: Radiation Oncology

## 2018-02-02 DIAGNOSIS — Z510 Encounter for antineoplastic radiation therapy: Secondary | ICD-10-CM | POA: Diagnosis not present

## 2018-02-02 DIAGNOSIS — C8331 Diffuse large B-cell lymphoma, lymph nodes of head, face, and neck: Secondary | ICD-10-CM | POA: Diagnosis not present

## 2018-02-02 DIAGNOSIS — C8338 Diffuse large B-cell lymphoma, lymph nodes of multiple sites: Secondary | ICD-10-CM | POA: Diagnosis not present

## 2018-02-03 ENCOUNTER — Ambulatory Visit: Payer: Medicare Other

## 2018-02-03 ENCOUNTER — Ambulatory Visit
Admission: RE | Admit: 2018-02-03 | Discharge: 2018-02-03 | Disposition: A | Discharge status: Home / Self Care | Payer: Medicare Other | Source: Ambulatory Visit | Attending: Radiation Oncology | Admitting: Radiation Oncology

## 2018-02-03 DIAGNOSIS — C8331 Diffuse large B-cell lymphoma, lymph nodes of head, face, and neck: Secondary | ICD-10-CM | POA: Diagnosis not present

## 2018-02-03 DIAGNOSIS — Z510 Encounter for antineoplastic radiation therapy: Secondary | ICD-10-CM | POA: Diagnosis not present

## 2018-02-03 DIAGNOSIS — C8338 Diffuse large B-cell lymphoma, lymph nodes of multiple sites: Secondary | ICD-10-CM | POA: Diagnosis not present

## 2018-02-04 ENCOUNTER — Ambulatory Visit
Admission: RE | Admit: 2018-02-04 | Discharge: 2018-02-04 | Disposition: A | Discharge status: Home / Self Care | Payer: Medicare Other | Source: Ambulatory Visit | Attending: Radiation Oncology | Admitting: Radiation Oncology

## 2018-02-04 ENCOUNTER — Ambulatory Visit: Payer: Medicare Other | Admitting: Nutrition

## 2018-02-04 ENCOUNTER — Ambulatory Visit: Payer: Medicare Other

## 2018-02-04 DIAGNOSIS — C8331 Diffuse large B-cell lymphoma, lymph nodes of head, face, and neck: Secondary | ICD-10-CM | POA: Diagnosis not present

## 2018-02-04 DIAGNOSIS — Z510 Encounter for antineoplastic radiation therapy: Secondary | ICD-10-CM | POA: Diagnosis not present

## 2018-02-04 DIAGNOSIS — C8338 Diffuse large B-cell lymphoma, lymph nodes of multiple sites: Secondary | ICD-10-CM | POA: Diagnosis not present

## 2018-02-04 NOTE — Progress Notes (Signed)
Nutrition follow-up completed with patient and his wife for B-cell lymphoma.  The patient has completed chemotherapy and has 4 more days of radiation treatments. Weight improved and documented as 157.4 pounds increased from 155 pounds February 12. Patient reports he is completing his swallowing exercises. Reports his mouth is a little bit sore but he is eating soft foods. He has been consuming 2 Ensure Plus daily. He has no other nutrition concerns today.  Nutrition diagnosis: Unintentional weight loss, improving.  Intervention: Educated patient to continue to eat soft, high-calorie, high-protein foods to maintain weight. Recommended patient continue Ensure Plus twice daily. Provided coupons. Teach back method used.  Monitoring evaluation goals: Patient will work to continue adequate calories and protein for weight maintenance.  Next visit: Patient prefers to call me with any questions or concerns.  **Disclaimer: This note was dictated with voice recognition software. Similar sounding words can inadvertently be transcribed and this note may contain transcription errors which may not have been corrected upon publication of note.**

## 2018-02-05 ENCOUNTER — Ambulatory Visit: Payer: Medicare Other

## 2018-02-05 ENCOUNTER — Ambulatory Visit
Admission: RE | Admit: 2018-02-05 | Discharge: 2018-02-05 | Disposition: A | Discharge status: Home / Self Care | Payer: Medicare Other | Source: Ambulatory Visit | Attending: Radiation Oncology | Admitting: Radiation Oncology

## 2018-02-05 DIAGNOSIS — C8331 Diffuse large B-cell lymphoma, lymph nodes of head, face, and neck: Secondary | ICD-10-CM | POA: Diagnosis not present

## 2018-02-05 DIAGNOSIS — Z510 Encounter for antineoplastic radiation therapy: Secondary | ICD-10-CM | POA: Insufficient documentation

## 2018-02-08 ENCOUNTER — Ambulatory Visit: Payer: Medicare Other

## 2018-02-08 ENCOUNTER — Inpatient Hospital Stay: Payer: Medicare Other | Admitting: Nutrition

## 2018-02-08 ENCOUNTER — Ambulatory Visit
Admission: RE | Admit: 2018-02-08 | Discharge: 2018-02-08 | Disposition: A | Discharge status: Home / Self Care | Payer: Medicare Other | Source: Ambulatory Visit | Attending: Radiation Oncology | Admitting: Radiation Oncology

## 2018-02-08 DIAGNOSIS — Z510 Encounter for antineoplastic radiation therapy: Secondary | ICD-10-CM | POA: Diagnosis not present

## 2018-02-08 DIAGNOSIS — C8331 Diffuse large B-cell lymphoma, lymph nodes of head, face, and neck: Secondary | ICD-10-CM | POA: Diagnosis not present

## 2018-02-08 MED ORDER — SONAFINE EX EMUL
1.0000 "application " | Freq: Once | CUTANEOUS | Status: AC
  Administered 2018-02-08: 1 via TOPICAL

## 2018-02-09 ENCOUNTER — Ambulatory Visit: Payer: Medicare Other

## 2018-02-09 ENCOUNTER — Ambulatory Visit
Admission: RE | Admit: 2018-02-09 | Discharge: 2018-02-09 | Disposition: A | Discharge status: Home / Self Care | Payer: Medicare Other | Source: Ambulatory Visit | Attending: Radiation Oncology | Admitting: Radiation Oncology

## 2018-02-09 ENCOUNTER — Encounter: Payer: Self-pay | Admitting: *Deleted

## 2018-02-09 DIAGNOSIS — Z510 Encounter for antineoplastic radiation therapy: Secondary | ICD-10-CM | POA: Diagnosis not present

## 2018-02-09 DIAGNOSIS — C8331 Diffuse large B-cell lymphoma, lymph nodes of head, face, and neck: Secondary | ICD-10-CM | POA: Diagnosis not present

## 2018-02-10 ENCOUNTER — Ambulatory Visit: Payer: Medicare Other

## 2018-02-13 NOTE — Progress Notes (Signed)
Oncology Nurse Navigator Documentation  Met with Mr. Decou during final RT to offer support and to celebrate end of radiation treatment.  He was accompanied by his wife and dtrs. I provided wife with a Certificate of Recognition for her supportive care. I provided verbal/written post-RT guidance:  Importance of keeping all follow-up appts, especially those with Nutrition and SLP.  Importance of protecting treatment area from sun.  Continuation of Sonafine application 2-3 times daily until supply exhausted after which transition to OTC lotion with vitamin E. I explained that my role as navigator will continue for several more months and that I will be calling and/or joining him during follow-up visits.   I encouraged him to call me with needs/concerns.   Patient and wife verbalized understanding of information provided.  Gayleen Orem, RN, BSN, [place] at [place] [phone]

## 2018-02-17 ENCOUNTER — Encounter: Payer: Self-pay | Admitting: Radiation Oncology

## 2018-02-17 NOTE — Progress Notes (Signed)
  Radiation Oncology         [phone] ________________________________  Name: Todd Bell MRN: [id number]  Date: 02/17/2018  DOB: Jul 25, 1946  End of Treatment Note  Diagnosis:   Stage IIA DLBCL of the neck/chest     Indication for treatment:  Curative      Radiation treatment dates:  01/07/18-02/09/18  Site/dose: 1) ISRT Neck/ 36 Gy in 18 fractions   2) ISRT Chest/ 43.2 Gy in 24 fractions  Beams/energy: 1) IMRT/ 6X   2) 3D/ 15X  Narrative: The patient tolerated radiation treatment relatively well. During treatment, the patient complained of pain to the throat and mouth. He compensated his decreased food input with Ensure. There was erythema to the neck area and mucositis to the upper throat.   Plan: The patient has completed radiation treatment. The patient will return to radiation oncology clinic for routine followup in one half month. I advised them to call or return sooner if they have any questions or concerns related to their recovery or treatment.  -----------------------------------  Eppie Gibson, MD  This document serves as a record of services personally performed by Eppie Gibson, MD. It was created on her behalf by Bethann Humble, a trained medical scribe. The creation of this record is based on the scribe's personal observations and the provider's statements to them. This document has been checked and approved by the attending provider.

## 2018-02-18 ENCOUNTER — Encounter: Payer: Self-pay | Admitting: Radiation Oncology

## 2018-02-18 NOTE — Progress Notes (Signed)
Mr. Pellicane presents for follow up of radiation completed 02/09/18 to his Neck and Chest  Pain issues, if any: He denies Using a feeding tube?: N/A Weight changes, if any:  Wt Readings from Last 3 Encounters:  02/23/18 158 lb (71.7 kg)  01/19/18 155 lb 12.8 oz (70.7 kg)  12/30/17 157 lb 12.8 oz (71.6 kg)   Swallowing issues, if any: He denies Smoking or chewing tobacco? No Using fluoride trays daily? No Last ENT visit was on: N/A Other notable issues, if any:  To see Dr. Lebron Conners at 3:00 today  BP 116/85   Pulse (!) 108   Temp (!) 97.5 F (36.4 C)   Ht 5\' 7"  (1.702 m)   Wt 158 lb (71.7 kg)   SpO2 99% Comment: room air  BMI 24.75 kg/m

## 2018-02-19 ENCOUNTER — Ambulatory Visit: Payer: Medicare Other

## 2018-02-22 ENCOUNTER — Ambulatory Visit: Payer: Medicare Other | Attending: Hematology

## 2018-02-22 DIAGNOSIS — R1310 Dysphagia, unspecified: Secondary | ICD-10-CM | POA: Diagnosis not present

## 2018-02-22 NOTE — Therapy (Signed)
[place] [address] [place], [place], [address] Phone: [phone]   Fax:  [phone]  Speech Language Pathology Treatment  Patient Details  Name: Todd Bell MRN: [id number] Date of Birth: 1946/11/18 Referring Provider: Eppie Gibson, MD   Encounter Date: 02/22/2018  End of Session - 02/22/18 1015    Visit Number  2    Number of Visits  3    Date for SLP Re-Evaluation  04/02/18    SLP Start Time  0930    SLP Stop Time   4268    SLP Time Calculation (min)  38 min    Activity Tolerance  Patient tolerated treatment well       Past Medical History:  Diagnosis Date  . Cancer ([place])   . History of radiation therapy 01/07/18- 02/09/18   Neck/ 36 Gy in 18 fractions, Chest/ 43.2 Gy in 24 fractions  . Medical history non-contributory   . Non Hodgkin's lymphoma [facility])     Past Surgical History:  Procedure Laterality Date  . CATARACT EXTRACTION    . COLONOSCOPY  02/2016  . IR FLUORO GUIDE PORT INSERTION LEFT  07/29/2017  . IR US GUIDE VASC ACCESS LEFT  07/29/2017  . LYMPH NODE BIOPSY Right 07/13/2017   Procedure: OPEN NECK LYMPH NODE BIOPSY RIGHT SIDE;  Surgeon: Izora Gala, MD;  Location: [place];  Service: ENT;  Laterality: Right;    There were no vitals filed for this visit.  Subjective Assessment - 02/22/18 0936    Subjective  Pt eating soft foods including chicken, dumplings. Drinking Ensure as well.    Patient is accompained by:  Family member wife    Currently in Pain?  Yes    Pain Score  5     Pain Location  Mouth    Pain Descriptors / Indicators  Burning    Pain Type  Acute pain    Pain Onset  1 to 4 weeks ago    Pain Frequency  Constant    Pain Relieving Factors  medication            ADULT SLP TREATMENT - 02/22/18 0940      General Information   Behavior/Cognition  Alert;Cooperative;Pleasant mood;Requires cueing verbose      Treatment Provided   Treatment provided   Dysphagia      Dysphagia Treatment   Temperature Spikes Noted  No    Treatment Methods  Skilled observation;Therapeutic exercise;Patient/caregiver education    Patient observed directly with PO's  Yes    Type of PO's observed  Dysphagia 1 (puree);Thin liquids    Oral Phase Signs & Symptoms  -- none noted    Pharyngeal Phase Signs & Symptoms  -- none noted    Other treatment/comments  Pt req'd max A for Masako and Mendelsohn. Pt was performing exercises that he could do, SLP encouraged pt to complete those exercises that were difficult for him more and more as he heals. SLP needed to give pt rationale for HEP. Pt told SLP he did not think HEP was necessary, SLP provided re-education to pt and wife re: rationale. Wife asked if pt would need to complete HEP for remainder of pt's life - SLP confirmed this and told pt/wife pt should complete HEP x2/day until Labor Day and then x2/week following that date.       Assessment / Recommendations / Plan   Plan  -- pt would like to have 8 weeks until next visit  Progression Toward Goals   Progression toward goals  Progressing toward goals       SLP Education - 02/22/18 1015    Education provided  Yes    Education Details  HEP, late effects head/neck radiation on swallowing, rationale for HEP    Person(s) Educated  Patient;Spouse    Methods  Explanation;Demonstration;Verbal cues    Comprehension  Verbalized understanding;Returned demonstration;Need further instruction;Verbal cues required       SLP Short Term Goals - 02/22/18 1018      SLP SHORT TERM GOAL #1   Title  pt will complete HEP with occasional min A     Status  Not Met      SLP SHORT TERM GOAL #2   Title  pt will tell SLP why he is completing HEP with min A    Status  Not Met       SLP Long Term Goals - 02/22/18 1018      SLP LONG TERM GOAL #1   Title  pt will complete HEP with occasional mod A    Time  1    Period  -- visits    Status  Revised      SLP LONG TERM GOAL  #2   Title  pt will tell SLP why he is completing HEP with mod A    Time  1    Period  -- visits    Status  Revised       Plan - 02/22/18 1016    Clinical Impression Statement  Pt with oropharyngeal swallowing essentially WNL, however the probability of swallowing difficulty increases dramatically with the initiation of radiation therapy. See "skilled intervention" for details. Pt was not completing full scope of HEP, req'd SLP to tell him rationale/reason for completion. Pt would like to be seen in two months - SLP thought this was satisfactory. Pt will need to be followed by SLP for regular assessment of accurate HEP completion as well as for safety with POs both during and following treatment/s.    Speech Therapy Frequency  -- approx once a month is recommended, but pt requests once every two months    Duration  -- 2 therapy visits    Treatment/Interventions  [place] precaution training;Pharyngeal strengthening exercises;Diet toleration management by SLP;Trials of upgraded texture/liquids;Internal/external aids;Patient/family education;Compensatory strategies;SLP instruction and feedback;Cueing hierarchy;Environmental controls    Potential to Achieve Goals  Good       Patient will benefit from skilled therapeutic intervention in order to improve the following deficits and impairments:   Dysphagia, unspecified type    Problem List Patient Active Problem List   Diagnosis Date Noted  . Diffuse large B-cell lymphoma of lymph nodes of neck ([place]) 07/17/2017    [place] ,MS, CCC-SLP  02/22/2018, 10:19 AM  [place] [address] [place], [place], [address] Phone: [phone]   Fax:  [phone]   Name: Todd Bell MRN: [id number] Date of Birth: [birth year]

## 2018-02-22 NOTE — Patient Instructions (Signed)
Please do the exercises you are able to do now, but keep working on exercises that are hard for you. As you heal these may become easier. You can do these exercises to avoid muscles getting hard in the future.

## 2018-02-23 ENCOUNTER — Encounter: Payer: Self-pay | Admitting: Radiation Oncology

## 2018-02-23 ENCOUNTER — Other Ambulatory Visit: Payer: Self-pay

## 2018-02-23 ENCOUNTER — Inpatient Hospital Stay: Payer: Medicare Other | Attending: Hematology and Oncology | Admitting: Hematology and Oncology

## 2018-02-23 ENCOUNTER — Ambulatory Visit
Admission: RE | Admit: 2018-02-23 | Discharge: 2018-02-23 | Disposition: A | Discharge status: Home / Self Care | Payer: Medicare Other | Source: Ambulatory Visit | Attending: Radiation Oncology | Admitting: Radiation Oncology

## 2018-02-23 ENCOUNTER — Encounter: Payer: Self-pay | Admitting: Hematology and Oncology

## 2018-02-23 VITALS — BP 116/85 | HR 100 | Temp 98.1°F | Resp 18 | Ht 67.0 in | Wt 158.3 lb

## 2018-02-23 VITALS — BP 116/85 | HR 108 | Temp 97.5°F | Ht 67.0 in | Wt 158.0 lb

## 2018-02-23 DIAGNOSIS — R682 Dry mouth, unspecified: Secondary | ICD-10-CM | POA: Diagnosis not present

## 2018-02-23 DIAGNOSIS — Z7982 Long term (current) use of aspirin: Secondary | ICD-10-CM | POA: Diagnosis not present

## 2018-02-23 DIAGNOSIS — C8331 Diffuse large B-cell lymphoma, lymph nodes of head, face, and neck: Secondary | ICD-10-CM | POA: Insufficient documentation

## 2018-02-23 DIAGNOSIS — Z79899 Other long term (current) drug therapy: Secondary | ICD-10-CM | POA: Insufficient documentation

## 2018-02-23 HISTORY — DX: Personal history of irradiation: Z92.3

## 2018-02-23 NOTE — Progress Notes (Signed)
Radiation Oncology         [phone] ________________________________  Name: Todd Bell MRN: [id number]  Date: 02/23/2018  DOB: 1946/10/28  Follow-Up Visit Note  CC: Lujean Amel, MD  Izora Gala, MD  Diagnosis and Prior Radiotherapy:       ICD-10-CM   1. Diffuse large B-cell lymphoma of lymph nodes of neck (HCC) C83.31    Stage IIA DLBCL of the neck/chest   Radiation treatment dates:  01/07/18-02/09/18 Site/dose: 1) Neck/ 36 Gy in 18 fractions                         2) Chest/ 43.2 Gy in 24 fractions Beams/energy: 1) IMRT/ 6X                         2) 3D/ 15X  CHIEF COMPLAINT:  Here for follow-up and surveillance of his diffuse large B-cell lymphoma cancer  Narrative:  The patient returns today for routine follow-up.  He denies any pain or swallowing issues. Continues to endorse dry mouth and mild burning in mouth (especially in the morning) but states this is improving. Endorses hx of biotin use but this does not offer much relief - water works better for dry mouth. Denies itching- itchiness at arms has subsided.  ALLERGIES:  has No Known Allergies.  Meds: Current Outpatient Medications  Medication Sig Dispense Refill  . acetaminophen (TYLENOL) 325 MG tablet Take 650 mg by mouth every 6 (six) hours as needed.    Marland Kitchen aspirin 81 MG chewable tablet Chew by mouth.    . lidocaine (XYLOCAINE) 2 % solution Patient: Mix 1part 2% viscous lidocaine, 1part H20. Swish & swallow 61mL of diluted mixture, 65min before meals and at bedtime, up to QID for soreness (Patient not taking: Reported on 02/23/2018) 100 mL 5   No current facility-administered medications for this encounter.     Physical Findings: The patient is in no acute distress. Patient is alert and oriented. Wt Readings from Last 3 Encounters:  02/23/18 158 lb 4.8 oz (71.8 kg)  02/23/18 158 lb (71.7 kg)  01/19/18 155 lb 12.8 oz (70.7 kg)    height is 5\' 7"  (1.702 m) and weight is 158 lb (71.7 kg). His temperature  is 97.5 F (36.4 C) (abnormal). His blood pressure is 116/85 and his pulse is 108 (abnormal). His oxygen saturation is 99%. .  General: Alert and oriented, in no acute distress HEENT: Head is normocephalic. Extraocular movements are intact. Oropharynx is clear. Still has patchy yellow coating to tongue but does not appear to be thrush. Skin: Skin healed well over neck. Slightly erythematous. No skin irritation over chest. Lymphatics: see Neck Exam Psychiatric: Judgment and insight are intact. Affect is appropriate.   Lab Findings: Lab Results  Component Value Date   WBC 4.3 12/25/2017   HGB 11.0 (L) 11/27/2017   HCT 33.5 (L) 12/25/2017   MCV 96.7 12/25/2017   PLT 290 12/25/2017    Lab Results  Component Value Date   TSH 2.431 07/23/2017    Radiographic Findings: No results found.  Impression/Plan:    1) Head and Neck Cancer Status: No evidence of disease. healing well from RT  2) Nutritional Status: Stable.  3)  Swallowing: Denies issues.  4) Thyroid function:  Lab Results  Component Value Date   TSH 2.431 07/23/2017  Advised pt/wife to seek annual TSH with PCP due to prior neck RT which slightly increases risk  of hypothyroidism.  5) Other: F/u with Dr. Lebron Conners later today.  I defer to him on restaging PET orders.  6) Follow-up prn. The patient was encouraged to call with any issues or questions before then.   _____________________________________   Eppie Gibson, MD  This document serves as a record of services personally performed by Eppie Gibson, MD. It was created on his behalf by Linward Natal, a trained medical scribe. The creation of this record is based on the scribe's personal observations and the provider's statements to them. This document has been checked and approved by the attending provider.

## 2018-02-23 NOTE — Progress Notes (Signed)
[facility] Follow-up Visit:  Assessment: Diffuse large B-cell lymphoma of lymph nodes of neck ([place]) 72 y.o. male with diagnosis of activated B-cell diffuse large B-cell lymphoma of the lymph nodes of the right side of the neck. Currently available staging information suggests presence of stage II bulky disease. Based on the diagnosis, patient was started on R-CHOP chemotherapy. There has been significant reduction in the bulk of the lymphoma by examination. Interval PET/CT demonstrates strong partial response, although we still see an area of increased FDG uptake in the main disease focus.  Patient has completed systemic chemoimmunotherapy with partial disease response.  Subsequently, patient received consolidative therapy with radiation treatment to the supraclavicular area and chest.  He has completed treatment and is recovering quite well from the therapy.  No clinical evidence of residual or recurrent malignancy.  Plan: -Disease assessment with PET/CT prior to the next visit in my clinic. -Return to my clinic 2 months: Labs, clinical evaluation for response to treatment and disease recurrence.  Voice recognition software was used and creation of this note. Despite my best effort at editing the text, some misspelling/errors may have occurred.  Orders Placed This Encounter  Procedures  . NM PET Image Restag (PS) Skull Base To Thigh    Standing Status:   Future    Standing Expiration Date:   02/23/2019    Order Specific Question:   If indicated for the ordered procedure, I authorize the administration of a radiopharmaceutical per Radiology protocol    Answer:   Yes    Order Specific Question:   Preferred imaging location?    Answer:   [facility]    Order Specific Question:   Radiology Contrast Protocol - do NOT remove file path    Answer:   [email or web address]    Order Specific Question:   Reason for Exam additional comments     Answer:   DLBCL s/p chemotherapy and radiation -- post-treatment disease assessment  . CBC with Differential (Cancer Center Only)    Standing Status:   Future    Standing Expiration Date:   02/24/2019  . CMP ([facility] only)    Standing Status:   Future    Standing Expiration Date:   02/24/2019  . Lactate dehydrogenase (LDH)    Standing Status:   Future    Standing Expiration Date:   02/23/2019  . Uric acid    Standing Status:   Future    Standing Expiration Date:   02/23/2019  . Beta 2 microglobulin, serum    Standing Status:   Future    Standing Expiration Date:   02/24/2019    Cancer Staging Diffuse large B-cell lymphoma of lymph nodes of neck (HCC) Staging form: Hodgkin and Non-Hodgkin Lymphoma, AJCC 8th Edition - Clinical stage from 07/13/2017: Stage II bulky (Diffuse large B-cell lymphoma) - Signed by Ardath Sax, MD on 07/20/2017   All questions were answered.  . The patient knows to call the clinic with any problems, questions or concerns.  This note was electronically signed.    History of Presenting Illness Todd Bell is a  72 y.o. [place] male follwed in the [facility] for diagnosis of diffuse large B-cell lymphoma.  Please see oncologic history below for details of the diagnosis. Patient has initially discovered asymmetry in his neck with visible mass earlier in July. He was seen by his primary care provider and subsequently directly to be evaluated by ENT. Patient underwent initially ultrasound of the neck demonstrating  a large mass, subsequently he underwent CT of the neck on 06/15/17 demonstrating normal laryngeal, pharyngeal, salivary structures and a 5.0 cm supraclavicular mass on the right side. CT of the chest obtained the same time demonstrated mass, but no lymphadenopathy in the mediastinum, hilar structures, and a 0.4 cm right upper lobe nodule and a 0.5 cm left upper lobe nodule. Patient underwent an FNA on 06/17/17 which demonstrated  presence of malignant cells that could not be further characterized due to insufficient sample. Initially, patient denied any significant symptoms in association with this mass. He denied any fevers, chills, night sweats. No significant weight loss. No changes to his activities of daily living or activity tolerance. Denied changes to his appetite. Denied chest pain, shortness of breath or cough. [name] had no nausea, early satiety, abdominal pain, diarrhea, or constipation. No dysuria or hematuria. Denied any cutaneous changes. Denied any vision changes, hearing changes, swallowing difficulties, any focal weakness or sensory deficits in the face or extremities.  Since last visit to the clinic, patient has completed his adjuvant radiation therapy.  Tolerated treatment well and has completely recovered from mucositis that has occurred during the treatment.  At the present time, patient denies any fever, chills, night sweats.  Denies any active shortness of breath, chest pain, or cough.  No recurrent swelling in the lymph nodes of the neck, armpits, or groin.  Oncological/hematological History:   Diffuse large B-cell lymphoma of lymph nodes of neck ([place])   06/15/2017 Imaging    CT Neck/Chest: Normal laryngeal, pharyngeal, salivary structures and a 5.0 cm supraclavicular mass on the right side.No lymphadenopathy in the mediastinum, hilar structures, and a 0.4 cm right upper lobe nodule and a 0.5 cm left upper lobe nodule.       06/30/2017 Imaging    PET-CT: Highly hypermetabolic right conglomerate supraclavicular mass. This is accompanied by accentuated metabolic activity in the palatine tonsillar pillars, a hypermetabolic right buccinator lymph node, hypermetabolic thoracic lymph nodes including a mildly enlarged AP window lymph node and other upper normal size but mildly hypermetabolic hilar and infrahilar nodes. Appearance compatible with malignancy such as lymphoma or adenocarcinoma      07/13/2017  Pathology Results    Rt Neck mass core bx: Sheets of large lymphocytes discernible lymph node structure, consistent with diffuse large B-cell lymphoma. IHC -- positive for LCA, CD20, CD79a, PAX-5, BCL-6 & negative for CD10, CD34, BCL-2, CD30, CD138, kappa or lambda light chains      07/17/2017 Initial Diagnosis    Diffuse large B-cell lymphoma of lymph nodes of neck ([place])      07/29/2017 Procedure    Placement of left IJ power-injectable port catheter.       07/31/2017 - 11/27/2017 Chemotherapy    R-CHOP21: Rituximab 375mg /m2, d1 + Cyclophosphamide 750mg /m2, d1 + Doxorubicin 50mg /m2, d1 + Vincristine 2mg , d1 + Prednisone 60mg  PO QDay, d1-5 Q21 d x6 cycles planned --Cycle #1, 07/31/17: --Cycle #2, 08/21/17: --Cycle #3, 09/11/17:  --Cycle #4, 16/10/96: Complicated by pneumonia with unknown microorganism --Cycle #5, 11/06/17: delayed due to pneumonia --Cycle #6, 11/27/17:      09/03/2017 Imaging    PET-CT: The index nodal mass in the right supraclavicular region measures 3.5 cm and has an SUV max equal to 6.33 (Deauville Criteria 4). On the previous exam this measured 7.7 cm and had an SUV max equal to 29. The right buccinator node measures 6 mm and has an SUV max equal to 3.13 (Deauville Criteria 3). Previously 7 mm with an  SUV max equal to 10.1. Mild increased FDG uptake associated with mediastinal and bilateral hilar regions. The index AP window lymph node measures 11 mm and has an SUV max equal to 3.93. (Deauville Criteria 4). Previously this measured 1.4 cm and had an SUV max of 4.9.      12/24/2017 PET scan    No evidence of new/progressive disease.  Stable [place] 4 enhancement in the right lower neck mass      01/07/2018 - 02/09/2018 Radiation Therapy    Dr Eppie Gibson: --Neck: 36GY/60frx --Chest: 43.2Gy/80frx       Medical History: Past Medical History:  Diagnosis Date  . Cancer ([place])   . History of radiation therapy 01/07/18- 02/09/18   Neck/ 36 Gy in 18 fractions, Chest/  43.2 Gy in 24 fractions  . Medical history non-contributory   . Non Hodgkin's lymphoma [facility])     Surgical History: Past Surgical History:  Procedure Laterality Date  . CATARACT EXTRACTION    . COLONOSCOPY  02/2016  . IR FLUORO GUIDE PORT INSERTION LEFT  07/29/2017  . IR US GUIDE VASC ACCESS LEFT  07/29/2017  . LYMPH NODE BIOPSY Right 07/13/2017   Procedure: OPEN NECK LYMPH NODE BIOPSY RIGHT SIDE;  Surgeon: Izora Gala, MD;  Location: [facility];  Service: ENT;  Laterality: Right;    Family History: Family History  Problem Relation Age of Onset  . Cancer Father        Pancreatic    Social History: Social History   Socioeconomic History  . Marital status: Married    Spouse name: Not on file  . Number of children: Not on file  . Years of education: Not on file  . Highest education level: Not on file  Social Needs  . Financial resource strain: Not on file  . Food insecurity - worry: Not on file  . Food insecurity - inability: Not on file  . Transportation needs - medical: Not on file  . Transportation needs - non-medical: Not on file  Occupational History  . Not on file  Tobacco Use  . Smoking status: Former Smoker    Last attempt to quit: 1988    Years since quitting: 31.2  . Smokeless tobacco: Never Used  . Tobacco comment: he was a social drinker  Substance and Sexual Activity  . Alcohol use: Yes    Comment: occasional  . Drug use: No  . Sexual activity: Not on file  Other Topics Concern  . Not on file  Social History Narrative  . Not on file    Allergies: No Known Allergies  Medications:  Current Outpatient Medications  Medication Sig Dispense Refill  . acetaminophen (TYLENOL) 325 MG tablet Take 650 mg by mouth every 6 (six) hours as needed.    Marland Kitchen aspirin 81 MG chewable tablet Chew by mouth.    . lidocaine (XYLOCAINE) 2 % solution Patient: Mix 1part 2% viscous lidocaine, 1part H20. Swish & swallow 61mL of diluted mixture, 32min before meals  and at bedtime, up to QID for soreness (Patient not taking: Reported on 02/23/2018) 100 mL 5   No current facility-administered medications for this visit.     Review of Systems: Review of Systems  Respiratory: Negative for chest tightness and cough.   Psychiatric/Behavioral: Negative for confusion.  All other systems reviewed and are negative.    PHYSICAL EXAMINATION Blood pressure 116/85, pulse 100, temperature 98.1 F (36.7 C), temperature source Oral, resp. rate 18, height 5\' 7"  (1.702 m), weight  158 lb 4.8 oz (71.8 kg), SpO2 99 %.  ECOG PERFORMANCE STATUS: 1 - Symptomatic but completely ambulatory  Physical Exam  Constitutional: He is oriented to person, place, and time and well-developed, well-nourished, and in no distress. Vital signs are normal. He appears not malnourished, not dehydrated and not jaundiced. He does not have a sickly appearance. No distress.  HENT:  Mouth/Throat: Uvula is midline, oropharynx is clear and moist and mucous membranes are normal. Mucous membranes are not pale, not dry and not cyanotic. He has dentures. Normal dentition.  Eyes: Conjunctivae and EOM are normal. Pupils are equal, round, and reactive to light. No scleral icterus.  Neck: No JVD present. Carotid bruit is not present.  Cardiovascular: Normal rate, regular rhythm and normal heart sounds.  No murmur heard. Pulmonary/Chest: Effort normal and breath sounds normal. No respiratory distress. He has no decreased breath sounds. He has no wheezes. He has no rhonchi. He has no rales.  Abdominal: Soft. Normal appearance and normal aorta. There is no hepatosplenomegaly. There is no tenderness.  Lymphadenopathy:       Head (right side): No submental, no submandibular, no tonsillar and no occipital adenopathy present.       Head (left side): No submental, no submandibular, no tonsillar and no occipital adenopathy present.    He has no cervical adenopathy.       Right cervical: No posterior cervical  adenopathy present.      Left cervical: No superficial cervical, no deep cervical and no posterior cervical adenopathy present.    He has no axillary adenopathy.       Right: No inguinal and no supraclavicular adenopathy present.       Left: No inguinal and no supraclavicular adenopathy present.  Complete resolution of the previously palpable bulky mass in the right lower neck/supraclavicular fossa.  Neurological: He is alert and oriented to person, place, and time. He has normal sensation, normal strength, normal reflexes and intact cranial nerves.  Skin: He is not diaphoretic.     LABORATORY DATA: I have personally reviewed the data as listed: No visits with results within 1 Week(s) from this visit.  Latest known visit with results is:  Hospital Outpatient Visit on 01/06/2018  Component Date Value Ref Range Status  . FVC-Pre 01/06/2018 2.27  L Final  . FVC-%Pred-Pre 01/06/2018 60  % Final  . FVC-Post 01/06/2018 2.56  L Final  . FVC-%Pred-Post 01/06/2018 67  % Final  . FVC-%Change-Post 01/06/2018 12  % Final  . FEV1-Pre 01/06/2018 1.76  L Final  . FEV1-%Pred-Pre 01/06/2018 64  % Final  . FEV1-Post 01/06/2018 2.05  L Final  . FEV1-%Pred-Post 01/06/2018 74  % Final  . FEV1-%Change-Post 01/06/2018 16  % Final  . FEV6-Pre 01/06/2018 2.24  L Final  . FEV6-%Pred-Pre 01/06/2018 63  % Final  . FEV6-Post 01/06/2018 2.53  L Final  . FEV6-%Pred-Post 01/06/2018 71  % Final  . FEV6-%Change-Post 01/06/2018 12  % Final  . Pre FEV1/FVC ratio 01/06/2018 77  % Final  . FEV1FVC-%Pred-Pre 01/06/2018 105  % Final  . Post FEV1/FVC ratio 01/06/2018 80  % Final  . FEV1FVC-%Change-Post 01/06/2018 3  % Final  . Pre FEV6/FVC Ratio 01/06/2018 98  % Final  . FEV6FVC-%Pred-Pre 01/06/2018 105  % Final  . Post FEV6/FVC ratio 01/06/2018 99  % Final  . FEV6FVC-%Pred-Post 01/06/2018 106  % Final  . FEV6FVC-%Change-Post 01/06/2018 0  % Final  . FEF 25-75 Pre 01/06/2018 1.51  L/sec Final  . [id number]  01/06/2018 75  % Final  . FEF 25-75 Post 01/06/2018 2.98  L/sec Final  . FEF2575-%Pred-Post 01/06/2018 149  % Final  . FEF2575-%Change-Post 01/06/2018 97  % Final  . RV 01/06/2018 1.82  L Final  . RV % pred 01/06/2018 76  % Final  . TLC 01/06/2018 4.11  L Final  . TLC % pred 01/06/2018 63  % Final  . DLCO unc 01/06/2018 10.08  ml/min/mmHg Final  . DLCO unc % pred 01/06/2018 35  % Final  . DLCO cor 01/06/2018 11.49  ml/min/mmHg Final  . DLCO cor % pred 01/06/2018 40  % Final  . DL/VA 01/06/2018 3.34  ml/min/mmHg/L Final  . DL/VA % pred 01/06/2018 75  % Final       Ardath Sax, MD

## 2018-02-23 NOTE — Assessment & Plan Note (Signed)
72 y.o. male with diagnosis of activated B-cell diffuse large B-cell lymphoma of the lymph nodes of the right side of the neck. Currently available staging information suggests presence of stage II bulky disease. Based on the diagnosis, patient was started on R-CHOP chemotherapy. There has been significant reduction in the bulk of the lymphoma by examination. Interval PET/CT demonstrates strong partial response, although we still see an area of increased FDG uptake in the main disease focus.  Patient has completed systemic chemoimmunotherapy with partial disease response.  Subsequently, patient received consolidative therapy with radiation treatment to the supraclavicular area and chest.  He has completed treatment and is recovering quite well from the therapy.  No clinical evidence of residual or recurrent malignancy.  Plan: -Disease assessment with PET/CT prior to the next visit in my clinic. -Return to my clinic 2 months: Labs, clinical evaluation for response to treatment and disease recurrence.

## 2018-03-21 ENCOUNTER — Inpatient Hospital Stay (HOSPITAL_COMMUNITY)
Admission: EM | Admit: 2018-03-21 | Discharge: 2018-04-07 | DRG: 871 | Disposition: E | Payer: Medicare Other | Attending: Pulmonary Disease | Admitting: Pulmonary Disease

## 2018-03-21 ENCOUNTER — Emergency Department (HOSPITAL_COMMUNITY): Payer: Medicare Other

## 2018-03-21 ENCOUNTER — Inpatient Hospital Stay (HOSPITAL_COMMUNITY): Payer: Medicare Other

## 2018-03-21 ENCOUNTER — Inpatient Hospital Stay (HOSPITAL_COMMUNITY): Admission: EM | Disposition: E | Payer: Self-pay | Source: Home / Self Care | Attending: Pulmonary Disease

## 2018-03-21 DIAGNOSIS — R402441 Other coma, without documented Glasgow coma scale score, or with partial score reported, in the field [EMT or ambulance]: Secondary | ICD-10-CM | POA: Diagnosis not present

## 2018-03-21 DIAGNOSIS — D6489 Other specified anemias: Secondary | ICD-10-CM | POA: Diagnosis present

## 2018-03-21 DIAGNOSIS — A419 Sepsis, unspecified organism: Secondary | ICD-10-CM | POA: Diagnosis not present

## 2018-03-21 DIAGNOSIS — R0602 Shortness of breath: Secondary | ICD-10-CM | POA: Diagnosis not present

## 2018-03-21 DIAGNOSIS — Z856 Personal history of leukemia: Secondary | ICD-10-CM

## 2018-03-21 DIAGNOSIS — J9600 Acute respiratory failure, unspecified whether with hypoxia or hypercapnia: Secondary | ICD-10-CM

## 2018-03-21 DIAGNOSIS — J9691 Respiratory failure, unspecified with hypoxia: Secondary | ICD-10-CM | POA: Diagnosis present

## 2018-03-21 DIAGNOSIS — R05 Cough: Secondary | ICD-10-CM | POA: Diagnosis not present

## 2018-03-21 DIAGNOSIS — D696 Thrombocytopenia, unspecified: Secondary | ICD-10-CM | POA: Diagnosis present

## 2018-03-21 DIAGNOSIS — Z800 Family history of malignant neoplasm of digestive organs: Secondary | ICD-10-CM

## 2018-03-21 DIAGNOSIS — I5181 Takotsubo syndrome: Secondary | ICD-10-CM | POA: Diagnosis present

## 2018-03-21 DIAGNOSIS — J810 Acute pulmonary edema: Secondary | ICD-10-CM | POA: Diagnosis not present

## 2018-03-21 DIAGNOSIS — J9601 Acute respiratory failure with hypoxia: Secondary | ICD-10-CM | POA: Diagnosis not present

## 2018-03-21 DIAGNOSIS — I272 Pulmonary hypertension, unspecified: Secondary | ICD-10-CM | POA: Diagnosis present

## 2018-03-21 DIAGNOSIS — Z923 Personal history of irradiation: Secondary | ICD-10-CM | POA: Diagnosis not present

## 2018-03-21 DIAGNOSIS — I249 Acute ischemic heart disease, unspecified: Secondary | ICD-10-CM | POA: Diagnosis present

## 2018-03-21 DIAGNOSIS — E872 Acidosis: Secondary | ICD-10-CM | POA: Diagnosis present

## 2018-03-21 DIAGNOSIS — Z515 Encounter for palliative care: Secondary | ICD-10-CM | POA: Diagnosis not present

## 2018-03-21 DIAGNOSIS — R6521 Severe sepsis with septic shock: Secondary | ICD-10-CM | POA: Diagnosis not present

## 2018-03-21 DIAGNOSIS — Z79899 Other long term (current) drug therapy: Secondary | ICD-10-CM

## 2018-03-21 DIAGNOSIS — Z7982 Long term (current) use of aspirin: Secondary | ICD-10-CM | POA: Diagnosis not present

## 2018-03-21 DIAGNOSIS — Y95 Nosocomial condition: Secondary | ICD-10-CM | POA: Diagnosis present

## 2018-03-21 DIAGNOSIS — J80 Acute respiratory distress syndrome: Secondary | ICD-10-CM | POA: Diagnosis not present

## 2018-03-21 DIAGNOSIS — C8331 Diffuse large B-cell lymphoma, lymph nodes of head, face, and neck: Secondary | ICD-10-CM | POA: Diagnosis present

## 2018-03-21 DIAGNOSIS — Z87891 Personal history of nicotine dependence: Secondary | ICD-10-CM

## 2018-03-21 DIAGNOSIS — E440 Moderate protein-calorie malnutrition: Secondary | ICD-10-CM

## 2018-03-21 DIAGNOSIS — J189 Pneumonia, unspecified organism: Secondary | ICD-10-CM | POA: Diagnosis present

## 2018-03-21 DIAGNOSIS — N179 Acute kidney failure, unspecified: Secondary | ICD-10-CM | POA: Diagnosis not present

## 2018-03-21 DIAGNOSIS — R570 Cardiogenic shock: Secondary | ICD-10-CM

## 2018-03-21 DIAGNOSIS — R918 Other nonspecific abnormal finding of lung field: Secondary | ICD-10-CM | POA: Diagnosis not present

## 2018-03-21 DIAGNOSIS — Z4682 Encounter for fitting and adjustment of non-vascular catheter: Secondary | ICD-10-CM | POA: Diagnosis not present

## 2018-03-21 DIAGNOSIS — Z9221 Personal history of antineoplastic chemotherapy: Secondary | ICD-10-CM | POA: Diagnosis not present

## 2018-03-21 DIAGNOSIS — R0603 Acute respiratory distress: Secondary | ICD-10-CM

## 2018-03-21 DIAGNOSIS — Z9289 Personal history of other medical treatment: Secondary | ICD-10-CM

## 2018-03-21 DIAGNOSIS — K7200 Acute and subacute hepatic failure without coma: Secondary | ICD-10-CM | POA: Diagnosis present

## 2018-03-21 DIAGNOSIS — G9340 Encephalopathy, unspecified: Secondary | ICD-10-CM | POA: Diagnosis not present

## 2018-03-21 DIAGNOSIS — J9690 Respiratory failure, unspecified, unspecified whether with hypoxia or hypercapnia: Secondary | ICD-10-CM

## 2018-03-21 DIAGNOSIS — J811 Chronic pulmonary edema: Secondary | ICD-10-CM | POA: Diagnosis not present

## 2018-03-21 DIAGNOSIS — Z7189 Other specified counseling: Secondary | ICD-10-CM | POA: Diagnosis not present

## 2018-03-21 DIAGNOSIS — R0902 Hypoxemia: Secondary | ICD-10-CM

## 2018-03-21 DIAGNOSIS — J479 Bronchiectasis, uncomplicated: Secondary | ICD-10-CM | POA: Diagnosis not present

## 2018-03-21 HISTORY — PX: RIGHT/LEFT HEART CATH AND CORONARY ANGIOGRAPHY: CATH118266

## 2018-03-21 LAB — CBC WITH DIFFERENTIAL/PLATELET
Basophils Absolute: 0 10*3/uL (ref 0.0–0.1)
Basophils Absolute: 0 10*3/uL (ref 0.0–0.1)
Basophils Relative: 0 %
Basophils Relative: 0 %
EOS PCT: 0 %
EOS PCT: 0 %
Eosinophils Absolute: 0 10*3/uL (ref 0.0–0.7)
Eosinophils Absolute: 0 10*3/uL (ref 0.0–0.7)
HCT: 33.2 % — ABNORMAL LOW (ref 39.0–52.0)
HEMATOCRIT: 35 % — AB (ref 39.0–52.0)
HEMOGLOBIN: 11.2 g/dL — AB (ref 13.0–17.0)
Hemoglobin: 10.8 g/dL — ABNORMAL LOW (ref 13.0–17.0)
LYMPHS ABS: 1.2 10*3/uL (ref 0.7–4.0)
LYMPHS PCT: 7 %
Lymphocytes Relative: 15 %
Lymphs Abs: 0.5 10*3/uL — ABNORMAL LOW (ref 0.7–4.0)
MCH: 31.7 pg (ref 26.0–34.0)
MCH: 31.6 pg (ref 26.0–34.0)
MCHC: 32.5 g/dL (ref 30.0–36.0)
MCHC: 32 g/dL (ref 30.0–36.0)
MCV: 97.4 fL (ref 78.0–100.0)
MCV: 98.9 fL (ref 78.0–100.0)
MONOS PCT: 13 %
Monocytes Absolute: 0.9 10*3/uL (ref 0.1–1.0)
Monocytes Absolute: 1.1 10*3/uL — ABNORMAL HIGH (ref 0.1–1.0)
Monocytes Relative: 14 %
NEUTROS ABS: 6 10*3/uL (ref 1.7–7.7)
NEUTROS PCT: 79 %
Neutro Abs: 5.3 10*3/uL (ref 1.7–7.7)
Neutrophils Relative %: 72 %
PLATELETS: 88 10*3/uL — AB (ref 150–400)
Platelets: 82 10*3/uL — ABNORMAL LOW (ref 150–400)
RBC: 3.41 MIL/uL — AB (ref 4.22–5.81)
RBC: 3.54 MIL/uL — ABNORMAL LOW (ref 4.22–5.81)
RDW: 16.4 % — ABNORMAL HIGH (ref 11.5–15.5)
RDW: 16.4 % — ABNORMAL HIGH (ref 11.5–15.5)
WBC: 6.7 10*3/uL (ref 4.0–10.5)
WBC: 8.3 10*3/uL (ref 4.0–10.5)

## 2018-03-21 LAB — BLOOD GAS, ARTERIAL
Acid-base deficit: 4.2 mmol/L — ABNORMAL HIGH (ref 0.0–2.0)
Acid-base deficit: 4.7 mmol/L — ABNORMAL HIGH (ref 0.0–2.0)
Acid-base deficit: 5.7 mmol/L — ABNORMAL HIGH (ref 0.0–2.0)
BICARBONATE: 21.5 mmol/L (ref 20.0–28.0)
Bicarbonate: 18.9 mmol/L — ABNORMAL LOW (ref 20.0–28.0)
Bicarbonate: 20.7 mmol/L (ref 20.0–28.0)
DRAWN BY: 257701
Delivery systems: POSITIVE
Drawn by: 257701
Drawn by: 257881
Expiratory PAP: 8
FIO2: 100
FIO2: 100
FIO2: 100
Inspiratory PAP: 12
LHR: 25 {breaths}/min
MECHVT: 500 mL
Mode: POSITIVE
O2 SAT: 91.6 %
O2 Saturation: 89.5 %
O2 Saturation: 86.4 %
PATIENT TEMPERATURE: 97.6
PCO2 ART: 28.7 mmHg — AB (ref 32.0–48.0)
PCO2 ART: 40.8 mmHg (ref 32.0–48.0)
PEEP/CPAP: 12 cmH2O
PEEP: 5 cmH2O
PO2 ART: 64.2 mmHg — AB (ref 83.0–108.0)
Patient temperature: 97.6
Patient temperature: 98.6
RATE: 12 resp/min
RATE: 22 resp/min
VT: 620 mL
pCO2 arterial: 52.4 mmHg — ABNORMAL HIGH (ref 32.0–48.0)
pH, Arterial: 7.429 (ref 7.350–7.450)
pH, Arterial: 7.323 — ABNORMAL LOW (ref 7.350–7.450)
pH, Arterial: 7.236 — ABNORMAL LOW (ref 7.350–7.450)
pO2, Arterial: 78.2 mmHg — ABNORMAL LOW (ref 83.0–108.0)
pO2, Arterial: 71.8 mmHg — ABNORMAL LOW (ref 83.0–108.0)

## 2018-03-21 LAB — CREATININE, SERUM
Creatinine, Ser: 1.22 mg/dL (ref 0.61–1.24)
GFR calc non Af Amer: 57 mL/min — ABNORMAL LOW (ref >60)

## 2018-03-21 LAB — CBC
HEMATOCRIT: 32.7 % — AB (ref 39.0–52.0)
Hemoglobin: 10.3 g/dL — ABNORMAL LOW (ref 13.0–17.0)
MCH: 30.9 pg (ref 26.0–34.0)
MCHC: 31.5 g/dL (ref 30.0–36.0)
MCV: 98.2 fL (ref 78.0–100.0)
PLATELETS: 70 10*3/uL — AB (ref 150–400)
RBC: 3.33 MIL/uL — ABNORMAL LOW (ref 4.22–5.81)
RDW: 16.6 % — AB (ref 11.5–15.5)
WBC: 7.5 10*3/uL (ref 4.0–10.5)

## 2018-03-21 LAB — URINALYSIS, ROUTINE W REFLEX MICROSCOPIC
BILIRUBIN URINE: NEGATIVE
GLUCOSE, UA: NEGATIVE mg/dL
KETONES UR: NEGATIVE mg/dL
LEUKOCYTES UA: NEGATIVE
Nitrite: NEGATIVE
PH: 5 (ref 5.0–8.0)
Protein, ur: 100 mg/dL — AB
SQUAMOUS EPITHELIAL / LPF: NONE SEEN
Specific Gravity, Urine: 1.026 (ref 1.005–1.030)

## 2018-03-21 LAB — COMPREHENSIVE METABOLIC PANEL
ALK PHOS: 90 U/L (ref 38–126)
ALT: 537 U/L — AB (ref 17–63)
ALT: 599 U/L — AB (ref 17–63)
ANION GAP: 15 (ref 5–15)
AST: 617 U/L — ABNORMAL HIGH (ref 15–41)
AST: 900 U/L — AB (ref 15–41)
Albumin: 2.5 g/dL — ABNORMAL LOW (ref 3.5–5.0)
Albumin: 2.8 g/dL — ABNORMAL LOW (ref 3.5–5.0)
Alkaline Phosphatase: 83 U/L (ref 38–126)
Anion gap: 12 (ref 5–15)
BUN: 54 mg/dL — ABNORMAL HIGH (ref 6–20)
BUN: 59 mg/dL — AB (ref 6–20)
CHLORIDE: 111 mmol/L (ref 101–111)
CHLORIDE: 112 mmol/L — AB (ref 101–111)
CO2: 18 mmol/L — ABNORMAL LOW (ref 22–32)
CO2: 20 mmol/L — AB (ref 22–32)
CREATININE: 1.64 mg/dL — AB (ref 0.61–1.24)
CREATININE: 1.33 mg/dL — AB (ref 0.61–1.24)
Calcium: 7.6 mg/dL — ABNORMAL LOW (ref 8.9–10.3)
Calcium: 7.8 mg/dL — ABNORMAL LOW (ref 8.9–10.3)
GFR calc Af Amer: 59 mL/min — ABNORMAL LOW (ref >60)
GFR calc non Af Amer: 51 mL/min — ABNORMAL LOW (ref >60)
GFR, EST AFRICAN AMERICAN: 46 mL/min — AB (ref >60)
GFR, EST NON AFRICAN AMERICAN: 40 mL/min — AB (ref >60)
Glucose, Bld: 130 mg/dL — ABNORMAL HIGH (ref 65–99)
Glucose, Bld: 169 mg/dL — ABNORMAL HIGH (ref 65–99)
POTASSIUM: 5.6 mmol/L — AB (ref 3.5–5.1)
Potassium: 5 mmol/L (ref 3.5–5.1)
Sodium: 144 mmol/L (ref 135–145)
Sodium: 144 mmol/L (ref 135–145)
Total Bilirubin: 1.8 mg/dL — ABNORMAL HIGH (ref 0.3–1.2)
Total Bilirubin: 0.7 mg/dL (ref 0.3–1.2)
Total Protein: 6.3 g/dL — ABNORMAL LOW (ref 6.5–8.1)
Total Protein: 6.8 g/dL (ref 6.5–8.1)

## 2018-03-21 LAB — TYPE AND SCREEN
ABO/RH(D): A POS
ANTIBODY SCREEN: NEGATIVE

## 2018-03-21 LAB — APTT: aPTT: 33 seconds (ref 24–36)

## 2018-03-21 LAB — I-STAT CG4 LACTIC ACID, ED
LACTIC ACID, VENOUS: 4.82 mmol/L — AB (ref 0.5–1.9)
LACTIC ACID, VENOUS: 3.24 mmol/L — AB (ref 0.5–1.9)

## 2018-03-21 LAB — PROTIME-INR
INR: 1.43
Prothrombin Time: 17.3 seconds — ABNORMAL HIGH (ref 11.4–15.2)

## 2018-03-21 LAB — D-DIMER, QUANTITATIVE: D-Dimer, Quant: >20 ug/mL-FEU — ABNORMAL HIGH (ref 0.00–0.50)

## 2018-03-21 LAB — MRSA PCR SCREENING: MRSA BY PCR: NEGATIVE

## 2018-03-21 LAB — TROPONIN I
TROPONIN I: 10.42 ng/mL — AB (ref <0.03)
Troponin I: 5.23 ng/mL (ref <0.03)

## 2018-03-21 LAB — BRAIN NATRIURETIC PEPTIDE: B Natriuretic Peptide: 621.7 pg/mL — ABNORMAL HIGH (ref 0.0–100.0)

## 2018-03-21 LAB — LACTIC ACID, PLASMA: Lactic Acid, Venous: 1.9 mmol/L (ref 0.5–1.9)

## 2018-03-21 LAB — PROCALCITONIN: PROCALCITONIN: 2.12 ng/mL

## 2018-03-21 SURGERY — RIGHT/LEFT HEART CATH AND CORONARY ANGIOGRAPHY
Anesthesia: LOCAL

## 2018-03-21 MED ORDER — CHLORHEXIDINE GLUCONATE 0.12% ORAL RINSE (MEDLINE KIT)
15.0000 mL | Freq: Two times a day (BID) | OROMUCOSAL | Status: DC
  Administered 2018-03-21 – 2018-03-24 (×6): 15 mL via OROMUCOSAL

## 2018-03-21 MED ORDER — IOPAMIDOL (ISOVUE-370) INJECTION 76%
INTRAVENOUS | Status: AC
  Filled 2018-03-21: qty 125

## 2018-03-21 MED ORDER — ASPIRIN 81 MG PO CHEW: 324.0000 mg | CHEWABLE_TABLET | ORAL | Status: AC

## 2018-03-21 MED ORDER — HEPARIN SODIUM (PORCINE) 5000 UNIT/ML IJ SOLN
5000.0000 [IU] | Freq: Three times a day (TID) | INTRAMUSCULAR | Status: DC
  Administered 2018-03-22 – 2018-03-23 (×3): 5000 [IU] via SUBCUTANEOUS
  Filled 2018-03-21 (×3): qty 1

## 2018-03-21 MED ORDER — SODIUM CHLORIDE 0.9 % IV SOLN
1000.0000 mL | INTRAVENOUS | Status: DC
  Administered 2018-03-21: 1000 mL via INTRAVENOUS

## 2018-03-21 MED ORDER — SODIUM CHLORIDE 0.9 % IV SOLN
INTRAVENOUS | Status: DC
  Administered 2018-03-21: 23:00:00 via INTRAVENOUS

## 2018-03-21 MED ORDER — FAMOTIDINE IN NACL 20-0.9 MG/50ML-% IV SOLN
20.0000 mg | Freq: Two times a day (BID) | INTRAVENOUS | Status: DC
  Administered 2018-03-22 – 2018-03-24 (×5): 20 mg via INTRAVENOUS
  Filled 2018-03-21 (×6): qty 50

## 2018-03-21 MED ORDER — SODIUM CHLORIDE 0.9% FLUSH
3.0000 mL | Freq: Two times a day (BID) | INTRAVENOUS | Status: DC
  Administered 2018-03-22 – 2018-03-24 (×3): 3 mL via INTRAVENOUS

## 2018-03-21 MED ORDER — ROCURONIUM BROMIDE 50 MG/5ML IV SOLN
80.0000 mg | Freq: Once | INTRAVENOUS | Status: AC
  Administered 2018-03-21: 80 mg via INTRAVENOUS

## 2018-03-21 MED ORDER — SODIUM CHLORIDE 0.9 % IV SOLN
INTRAVENOUS | Status: DC
  Administered 2018-03-21: 14:00:00 via INTRAVENOUS

## 2018-03-21 MED ORDER — VANCOMYCIN HCL IN DEXTROSE 750-5 MG/150ML-% IV SOLN
750.0000 mg | Freq: Two times a day (BID) | INTRAVENOUS | Status: DC
  Administered 2018-03-21 – 2018-03-24 (×5): 750 mg via INTRAVENOUS
  Filled 2018-03-21 (×7): qty 150

## 2018-03-21 MED ORDER — SENNOSIDES 8.8 MG/5ML PO SYRP
5.0000 mL | ORAL_SOLUTION | Freq: Two times a day (BID) | ORAL | Status: DC | PRN
  Filled 2018-03-21: qty 5

## 2018-03-21 MED ORDER — PROPOFOL 1000 MG/100ML IV EMUL
5.0000 ug/kg/min | Freq: Once | INTRAVENOUS | Status: AC
  Administered 2018-03-21: 5 ug/kg/min via INTRAVENOUS

## 2018-03-21 MED ORDER — ROCURONIUM BROMIDE 50 MG/5ML IV SOLN
40.0000 mg | Freq: Once | INTRAVENOUS | Status: AC
  Administered 2018-03-21: 40 mg via INTRAVENOUS

## 2018-03-21 MED ORDER — SODIUM CHLORIDE 0.9 % IV SOLN
INTRAVENOUS | Status: DC
  Administered 2018-03-21: 250 mL/h via INTRAVENOUS

## 2018-03-21 MED ORDER — PROPOFOL 1000 MG/100ML IV EMUL
INTRAVENOUS | Status: AC
  Filled 2018-03-21: qty 100

## 2018-03-21 MED ORDER — HEPARIN (PORCINE) IN NACL 100-0.45 UNIT/ML-% IJ SOLN
850.0000 [IU]/h | INTRAMUSCULAR | Status: DC
  Administered 2018-03-21: 850 [IU]/h via INTRAVENOUS
  Filled 2018-03-21: qty 250

## 2018-03-21 MED ORDER — HEPARIN BOLUS VIA INFUSION
4000.0000 [IU] | INTRAVENOUS | Status: AC
  Administered 2018-03-21: 4000 [IU] via INTRAVENOUS
  Filled 2018-03-21: qty 4000

## 2018-03-21 MED ORDER — SODIUM CHLORIDE 0.9 % IV SOLN: 250.0000 mL | INTRAVENOUS | Status: DC | PRN

## 2018-03-21 MED ORDER — CHLORHEXIDINE GLUCONATE CLOTH 2 % EX PADS
6.0000 | MEDICATED_PAD | Freq: Every day | CUTANEOUS | Status: DC
  Administered 2018-03-22 – 2018-03-24 (×3): 6 via TOPICAL

## 2018-03-21 MED ORDER — ASPIRIN 300 MG RE SUPP
300.0000 mg | RECTAL | Status: AC
  Administered 2018-03-21: 300 mg via RECTAL
  Filled 2018-03-21: qty 1

## 2018-03-21 MED ORDER — NOREPINEPHRINE BITARTRATE 1 MG/ML IV SOLN
0.0000 ug/min | INTRAVENOUS | Status: DC
  Administered 2018-03-21: 15 ug/min via INTRAVENOUS
  Filled 2018-03-21: qty 4

## 2018-03-21 MED ORDER — FENTANYL 2500MCG IN NS 250ML (10MCG/ML) PREMIX INFUSION
25.0000 ug/h | INTRAVENOUS | Status: DC
  Administered 2018-03-21: 50 ug/h via INTRAVENOUS
  Filled 2018-03-21: qty 250

## 2018-03-21 MED ORDER — VANCOMYCIN HCL IN DEXTROSE 1-5 GM/200ML-% IV SOLN
1000.0000 mg | Freq: Once | INTRAVENOUS | Status: AC
  Administered 2018-03-21: 1000 mg via INTRAVENOUS
  Filled 2018-03-21: qty 200

## 2018-03-21 MED ORDER — ORAL CARE MOUTH RINSE
15.0000 mL | OROMUCOSAL | Status: DC
  Administered 2018-03-21 – 2018-03-24 (×26): 15 mL via OROMUCOSAL

## 2018-03-21 MED ORDER — FENTANYL CITRATE (PF) 100 MCG/2ML IJ SOLN
100.0000 ug | Freq: Once | INTRAMUSCULAR | Status: AC
  Administered 2018-03-21: 100 ug via INTRAVENOUS

## 2018-03-21 MED ORDER — MIDAZOLAM HCL 2 MG/2ML IJ SOLN: 1.0000 mg | INTRAMUSCULAR | Status: DC | PRN

## 2018-03-21 MED ORDER — HEPARIN (PORCINE) IN NACL 2-0.9 UNIT/ML-% IJ SOLN
INTRAMUSCULAR | Status: AC
  Filled 2018-03-21: qty 1000

## 2018-03-21 MED ORDER — PIPERACILLIN-TAZOBACTAM 3.375 G IVPB 30 MIN
3.3750 g | Freq: Once | INTRAVENOUS | Status: AC
  Administered 2018-03-21: 3.375 g via INTRAVENOUS
  Filled 2018-03-21: qty 50

## 2018-03-21 MED ORDER — PIPERACILLIN-TAZOBACTAM 3.375 G IVPB
3.3750 g | Freq: Three times a day (TID) | INTRAVENOUS | Status: DC
  Administered 2018-03-21 – 2018-03-24 (×9): 3.375 g via INTRAVENOUS
  Filled 2018-03-21 (×12): qty 50

## 2018-03-21 MED ORDER — SODIUM CHLORIDE 0.9% FLUSH: 3.0000 mL | INTRAVENOUS | Status: DC | PRN

## 2018-03-21 MED ORDER — FENTANYL CITRATE (PF) 100 MCG/2ML IJ SOLN
INTRAMUSCULAR | Status: AC
  Administered 2018-03-21: 100 ug via INTRAVENOUS
  Filled 2018-03-21: qty 2

## 2018-03-21 MED ORDER — SODIUM CHLORIDE 0.9 % IV SOLN
INTRAVENOUS | Status: AC | PRN
  Administered 2018-03-21: 10 mL/h via INTRAVENOUS

## 2018-03-21 MED ORDER — MIDAZOLAM HCL 2 MG/2ML IJ SOLN
INTRAMUSCULAR | Status: AC
  Administered 2018-03-21: 6 mg via INTRAVENOUS
  Filled 2018-03-21: qty 6

## 2018-03-21 MED ORDER — ETOMIDATE 2 MG/ML IV SOLN
20.0000 mg | Freq: Once | INTRAVENOUS | Status: AC
  Administered 2018-03-21: 20 mg via INTRAVENOUS

## 2018-03-21 MED ORDER — NOREPINEPHRINE 4 MG/250ML-% IV SOLN
0.0000 ug/min | Freq: Once | INTRAVENOUS | Status: AC
  Administered 2018-03-21: 6 ug/min via INTRAVENOUS
  Filled 2018-03-21: qty 250

## 2018-03-21 MED ORDER — MIDAZOLAM HCL 2 MG/2ML IJ SOLN
6.0000 mg | Freq: Once | INTRAMUSCULAR | Status: AC
  Administered 2018-03-21: 6 mg via INTRAVENOUS

## 2018-03-21 MED ORDER — FENTANYL CITRATE (PF) 100 MCG/2ML IJ SOLN: 50.0000 ug | Freq: Once | INTRAMUSCULAR | Status: DC

## 2018-03-21 MED ORDER — LIDOCAINE HCL (PF) 1 % IJ SOLN
INTRAMUSCULAR | Status: DC | PRN
  Administered 2018-03-21: 23 mL

## 2018-03-21 MED ORDER — SODIUM CHLORIDE 0.9% FLUSH: 10.0000 mL | INTRAVENOUS | Status: DC | PRN

## 2018-03-21 MED ORDER — FENTANYL BOLUS VIA INFUSION
25.0000 ug | INTRAVENOUS | Status: DC | PRN
  Filled 2018-03-21: qty 25

## 2018-03-21 MED ORDER — HEPARIN (PORCINE) IN NACL 2-0.9 UNIT/ML-% IJ SOLN
INTRAMUSCULAR | Status: AC | PRN
  Administered 2018-03-21 (×2): 500 mL via INTRA_ARTERIAL

## 2018-03-21 MED ORDER — SODIUM CHLORIDE 0.9% FLUSH
10.0000 mL | Freq: Two times a day (BID) | INTRAVENOUS | Status: DC
  Administered 2018-03-22 – 2018-03-24 (×2): 10 mL

## 2018-03-21 MED ORDER — LORAZEPAM 2 MG/ML IJ SOLN
1.0000 mg | Freq: Once | INTRAMUSCULAR | Status: AC
  Administered 2018-03-21: 1 mg via INTRAVENOUS
  Filled 2018-03-21: qty 1

## 2018-03-21 MED ORDER — PROPOFOL 10 MG/ML IV BOLUS
0.5000 mg/kg | Freq: Once | INTRAVENOUS | Status: AC
  Administered 2018-03-21: 35.9 mg via INTRAVENOUS

## 2018-03-21 MED ORDER — BISACODYL 10 MG RE SUPP: 10.0000 mg | Freq: Every day | RECTAL | Status: DC | PRN

## 2018-03-21 MED ORDER — IOPAMIDOL (ISOVUE-370) INJECTION 76%
INTRAVENOUS | Status: DC | PRN
  Administered 2018-03-21: 105 mL via INTRA_ARTERIAL

## 2018-03-21 MED ORDER — HEPARIN (PORCINE) IN NACL 100-0.45 UNIT/ML-% IJ SOLN: 10.0000 [IU]/kg/h | INTRAMUSCULAR | Status: DC

## 2018-03-21 MED ORDER — LIDOCAINE HCL (PF) 1 % IJ SOLN
INTRAMUSCULAR | Status: AC
  Filled 2018-03-21: qty 30

## 2018-03-21 MED ORDER — MIDAZOLAM HCL 2 MG/2ML IJ SOLN
1.0000 mg | INTRAMUSCULAR | Status: DC | PRN
  Filled 2018-03-21: qty 2

## 2018-03-21 SURGICAL SUPPLY — 19 items
CATH INFINITI 5FR MULTPACK ANG (CATHETERS) ×2 IMPLANT
CATH SWAN GANZ 7F STRAIGHT (CATHETERS) ×2 IMPLANT
COVER PRB 48X5XTLSCP FOLD TPE (BAG) ×1 IMPLANT
COVER PROBE 5X48 (BAG) ×1
ELECT DEFIB PAD ADLT CADENCE (PAD) ×2 IMPLANT
KIT ENCORE 26 ADVANTAGE (KITS) IMPLANT
KIT HEART LEFT (KITS) ×2 IMPLANT
KIT HEMO VALVE WATCHDOG (MISCELLANEOUS) IMPLANT
NEEDLE PERC 21GX4CM (NEEDLE) IMPLANT
PACK CARDIAC CATHETERIZATION (CUSTOM PROCEDURE TRAY) ×2 IMPLANT
SHEATH AVANTI 11CM 5FR (SHEATH) ×2 IMPLANT
SHEATH AVANTI 11CM 7FR (SHEATH) ×2 IMPLANT
SHEATH RAIN RADIAL 21G 6FR (SHEATH) IMPLANT
SLEEVE REPOSITIONING LENGTH 30 (MISCELLANEOUS) ×4 IMPLANT
SYR MEDRAD MARK V 150ML (SYRINGE) ×2 IMPLANT
TRANSDUCER W/STOPCOCK (MISCELLANEOUS) ×2 IMPLANT
TUBING CIL FLEX 10 FLL-RA (TUBING) IMPLANT
WIRE EMERALD 3MM-J .025X260CM (WIRE) ×2 IMPLANT
WIRE EMERALD 3MM-J .035X150CM (WIRE) ×2 IMPLANT

## 2018-03-21 NOTE — Progress Notes (Signed)
Pt transported to 2H-15 without event.

## 2018-03-21 NOTE — Consult Note (Signed)
CONSULT    Patient ID: Todd Bell MRN: [id number], DOB/AGE: 74-Nov-1947   Admit date: 04/01/2018   Primary Physician: Lujean Amel, MD Primary Cardiologist: No primary care provider on file.  Patient Profile    41 y o man with cardiogenic shock and  ACS  Past Medical History    Past Medical History:  Diagnosis Date  . Cancer ([place])   . History of radiation therapy 01/07/18- 02/09/18   Neck/ 36 Gy in 18 fractions, Chest/ 43.2 Gy in 24 fractions  . Medical history non-contributory   . Non Hodgkin's lymphoma [facility])     Past Surgical History:  Procedure Laterality Date  . CATARACT EXTRACTION    . COLONOSCOPY  02/2016  . IR FLUORO GUIDE PORT INSERTION LEFT  07/29/2017  . IR US GUIDE VASC ACCESS LEFT  07/29/2017  . LYMPH NODE BIOPSY Right 07/13/2017   Procedure: OPEN NECK LYMPH NODE BIOPSY RIGHT SIDE;  Surgeon: Izora Gala, MD;  Location: [facility];  Service: ENT;  Laterality: Right;     Allergies  No Known Allergies  History of Present Illness    89 y o man with PMH of B cell lymphoma presents with an acute onset of SOB for about 24 h to WL. 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  He is followed by Dr. Lebron Conners and is scheduled for a PET scan in the near future.  Unresponsive at home with SBP in 70's and sats in the 80's on NRB on arrival to ED. Intubated on arrival to ED from what it appears on chart review. At [facility] found to have hypotension, global ST depressions, trop in 5 range and elevated lactate, elevated liver function tests.. Admission creat is 1.6.   Transfer to [facility] was arranged with plan for urgent LHC +/- IABP. Presume MI --> Cardiogenic shock   Home Medications    Prior to Admission medications   Medication Sig Start Date End Date Taking? Authorizing Provider  acetaminophen (TYLENOL) 325 MG  tablet Take 650 mg by mouth every 6 (six) hours as needed.   Yes [provider]  cetirizine (ZYRTEC) 10 MG tablet Take 10 mg by mouth daily.   Yes [provider]  Pseudoephedrine-DM-GG-APAP (ROBITUSSIN COLD/COUGH/FLU PO) Take 15 mLs by mouth.   Yes [provider]  aspirin 81 MG chewable tablet Chew by mouth.    [provider]  lidocaine (XYLOCAINE) 2 % solution Patient: Mix 1part 2% viscous lidocaine, 1part H20. Swish & swallow 52mL of diluted mixture, 76min before meals and at bedtime, up to QID for soreness Patient not taking: Reported on 02/23/2018 01/18/18   Eppie Gibson, MD    Family History    Family History  Problem Relation Age of Onset  . Cancer Father        Pancreatic   indicated that his father is deceased.   Social History    Social History   Socioeconomic History  . Marital status: Married    Spouse name: Not on file  . Number of children: Not on file  . Years of education: Not on file  . Highest education level: Not on file  Occupational History  . Not on file  Social Needs  . Financial resource strain: Not on file  . Food insecurity:    Worry: Not on file    Inability:  Not on file  . Transportation needs:    Medical: Not on file    Non-medical: Not on file  Tobacco Use  . Smoking status: Former Smoker    Last attempt to quit: 1988    Years since quitting: 31.3  . Smokeless tobacco: Never Used  . Tobacco comment: he was a social drinker  Substance and Sexual Activity  . Alcohol use: Yes    Comment: occasional  . Drug use: No  . Sexual activity: Not on file  Lifestyle  . Physical activity:    Days per week: Not on file    Minutes per session: Not on file  . Stress: Not on file  Relationships  . Social connections:    Talks on phone: Not on file    Gets together: Not on file    Attends religious service: Not on file    Active member of club or organization: Not on file    Attends meetings of clubs or  organizations: Not on file    Relationship status: Not on file  . Intimate partner violence:    Fear of current or ex partner: Not on file    Emotionally abused: Not on file    Physically abused: Not on file    Forced sexual activity: Not on file  Other Topics Concern  . Not on file  Social History Narrative  . Not on file     Review of Systems    General:  No chills, fever, night sweats or weight changes.  Cardiovascular:  No chest pain, dyspnea on exertion, edema, orthopnea, palpitations, paroxysmal nocturnal dyspnea. Dermatological: No rash, lesions/masses Respiratory: No cough, dyspnea Urologic: No hematuria, dysuria Abdominal:   No nausea, vomiting, diarrhea, bright red blood per rectum, melena, or hematemesis Neurologic:  No visual changes, wkns, changes in mental status. All other systems reviewed and are otherwise negative except as noted above.  Physical Exam    Blood pressure 107/86, pulse (!) 116, temperature 98.4 F (36.9 C), temperature source Oral, resp. rate (!) 36, height 6' (1.829 m), weight 71.3 kg (157 lb 3 oz), SpO2 98 %.  General: Intubated and sedated Psych: can not eval Neuro: can not eval HEENT: intub  Neck: intub Lungs: coarse Heart: tachy Abdomen: preserved pulses but weak  Labs    Troponin (Point of Care Test) No results for input(s): TROPIPOC in the last 72 hours. Recent Labs    03/19/2018 1050  TROPONINI 5.23*   Lab Results  Component Value Date   WBC 8.3 03/28/2018   HGB 11.2 (L) 03/29/2018   HCT 35.0 (L) 03/20/2018   MCV 98.9 03/08/2018   PLT 82 (L) 03/10/2018    Recent Labs  Lab 03/18/2018 1050  NA 144  K 5.6*  CL 111  CO2 18*  BUN 54*  CREATININE 1.64*  CALCIUM 7.6*  PROT 6.3*  BILITOT 1.8*  ALKPHOS 83  ALT 537*  AST 617*  GLUCOSE 130*   No results found for: CHOL, HDL, LDLCALC, TRIG No results found for: [facility]   Radiology Studies    Ct Chest Wo Contrast  Result Date: 03/14/2018 CLINICAL DATA:  Respiratory  distress EXAM: CT CHEST WITHOUT CONTRAST TECHNIQUE: Multidetector CT imaging of the chest was performed following the standard protocol without IV contrast. COMPARISON:  Chest radiograph dated 03/14/2018. PET-CT dated 12/24/2017. CT chest dated 10/23/2017. FINDINGS: Cardiovascular: The heart is normal in size. No pericardial effusion. No evidence of thoracic aortic aneurysm. Atherosclerotic calcifications of the aortic arch. Coronary atherosclerosis  the LAD and left circumflex. Left chest port terminates in the lower SVC. Mediastinum/Nodes: No suspicious mediastinal lymphadenopathy. Visualized thyroid is unremarkable. Lungs/Pleura: Endotracheal tube terminates 3.8 cm above the carina. Ground-glass opacity in the bilateral upper and lower lobes in a perihilar distribution with coarsened interstitial markings. Associated traction bronchiectasis. This appearance favors chronic interstitial lung disease which is progressive. The appearance does not favor a UIP pattern/idiopathic pulmonary fibrosis but may reflect NSIP. Calcified pleural plaques in the anterior right hemithorax. No pleural effusion or pneumothorax. Upper Abdomen: Enteric tube courses into the proximal stomach. Musculoskeletal: Mild degenerative changes of the visualized thoracolumbar spine. IMPRESSION: Endotracheal tube terminates 3.8 cm above the carina. Scattered ground-glass opacity with coarse interstitial markings and bronchiectasis, favoring progressive chronic interstitial lung disease. Aortic Atherosclerosis (ICD10-I70.0). Electronically Signed   By: Julian Hy M.D.   On: 03/18/2018 16:59   Dg Chest Portable 1 View  Result Date: 03/22/2018 CLINICAL DATA:  Hypoxia EXAM: PORTABLE CHEST 1 VIEW COMPARISON:  Chest radiograph and chest CT obtained earlier in the day FINDINGS: Endotracheal tube tip is 4.0 cm above the carina. Nasogastric tube tip and side port are below the diaphragm with side port seen in the stomach. Port-A-Cath tip in  superior vena cava. No pneumothorax. There is hazy opacity in both mid lungs. Heart is mildly enlarged with pulmonary vascularity within normal limits. No adenopathy. No bone lesions. IMPRESSION: Hazy opacity in both mid lungs, similar to earlier in the day. Suspect a degree of underlying pneumonitis. Stable cardiac prominence. Tube and catheter positions as described without pneumothorax. Electronically Signed   By: Lowella Grip III M.D.   On: 03/18/2018 16:49   Dg Chest Portable 1 View  Result Date: 03/26/2018 CLINICAL DATA:  ETT and OG tube placement. EXAM: PORTABLE CHEST 1 VIEW COMPARISON:  Chest x-ray from same day at 10:45 a.m. FINDINGS: Interval placement of an endotracheal tube with the tip approximately 4.2 cm above the level of the carina. Enteric tube in place with the distal side port near the gastroesophageal junction. Unchanged left chest wall port catheter with the tip in the distal SVC. Stable cardiomegaly. Unchanged perihilar and left mid lung interstitial thickening. No pleural effusion or pneumothorax. No acute osseous abnormality. IMPRESSION: 1. Appropriately positioned endotracheal tube. 2. Enteric tube in place with the distal side port near the gastroesophageal junction. Recommend advancement 2-3 cm. 3. Unchanged perihilar and left mid lung interstitial opacities which may reflect edema or interstitial pneumonitis related to infectious or inflammatory process. Electronically Signed   By: Titus Dubin M.D.   On: 03/16/2018 13:30   Dg Chest Port 1 View  Result Date: 03/15/2018 CLINICAL DATA:  Found unresponsive at home. History of cough. History of non-Hodgkin's lymphoma. EXAM: PORTABLE CHEST 1 VIEW COMPARISON:  Chest radiograph 10/05/2017 FINDINGS: Patient has a left jugular Port-A-Cath and the tip is in a stable position within the SVC. Mildly decreased lung volumes with increased interstitial densities in both lungs. Interstitial densities are most prominent in the left mid  lung region. Cardiomediastinal silhouette is prominent but may be accentuated by AP technique. IMPRESSION: Interstitial densities in both lungs most prominent in the left lung. Findings are suggestive for interstitial edema. Cannot exclude an infectious or inflammatory process, particularly in the left lung. Question mild cardiomegaly. Electronically Signed   By: Markus Daft M.D.   On: 03/31/2018 11:08    ECG & Cardiac Imaging   Sinus tach with global ST depress  Assessment & Plan    Cardiogenic shock in  setting of ACS.: Severely ill with poor prognosis. LHC with clea cors and surprisingly good hemodynamic. RA 12, PA mean 22, wedge 11, CI 2.4. LV gram with apical hypok and basal hyperkinesis. Aka Takotsubo. Etiology of Takotsubo is unclear at this time point. Will try to elucidate potential mental stressors from family. Can also consider sepsis.   Plan: - inotropic support with Levo. Will hold off mechanical support for now  - Can stop Heparin - Would eval for infection with BCx.   Signed, Cristina Gong, MD 03/16/2018, 7:33 PM

## 2018-03-21 NOTE — ED Notes (Signed)
Patient able to respond to verbal commands appropriately at this time. Provider aware. Patient able to state his name and that he speaks [place]. BI PAP intact, vital signs charted, see MAR.

## 2018-03-21 NOTE — ED Notes (Signed)
Repeat 12 lead performed per verbal order of CCMD at bedside.

## 2018-03-21 NOTE — ED Notes (Signed)
Advanced OG tube 3 cm per radiology report. Dr Wallie Char sts that a repeat xray is not needed. Pt family at bedside

## 2018-03-21 NOTE — Progress Notes (Signed)
   03/15/2018 1900  Clinical Encounter Type  Visited With Patient and family together  Visit Type Initial;Psychological support;Spiritual support;Social support;ED;Critical Care  Referral From Nurse  Consult/Referral To Chaplain  Spiritual Encounters  Spiritual Needs Prayer;Emotional  Stress Factors  Patient Stress Factors None identified  Family Stress Factors Loss of control   Name: Todd Bell, Todd Bell  Location: ICU (2nd floor room 1240) Suzanne Boron of Call: Pastoral Visit Chaplain paged to provide emotional, spiritual and psychological support to family of Pt. When Chaplain arrived, Pt was moved to ICU (2nd floor room 1240) [facility]le. Chaplain met family in the hallway and sat with them until they received more information. Chaplain walked with family to the [name] room. It was decided by medical staff that the Pt needed to be transported to [facility] for further assessment. There is no church community associated with family however, family reported to be people of prayer.  Wife, daughters, son in [place] are at bedside. Family continues to be updated about the primary care plan.

## 2018-03-21 NOTE — ED Notes (Signed)
ED TO INPATIENT HANDOFF REPORT  Name/Age/Gender Todd Bell 72 y.o. male  Code Status Code Status History    Date Active Date Inactive Code Status Order ID Comments User Context   10/05/2017 1754 10/06/2017 1707 Full Code [id number]  Elwin Mocha, MD Inpatient      Home/SNF/Other Home  Chief Complaint sepsis  Level of Care/Admitting Diagnosis ED Disposition    ED Disposition Condition [place]: [facility] [[id number]]  Level of Care: ICU [6]  Diagnosis: Respiratory failure with hypoxia [facility]) [[id number]]  Admitting Physician: Kipp Brood [[id number]]  Attending Physician: Kipp Brood [[id number]]  Estimated length of stay: past midnight tomorrow  Certification:: I certify this patient will need inpatient services for at least 2 midnights  PT Class (Do Not Modify): Inpatient [101]  PT Acc Code (Do Not Modify): Private [1]       Medical History Past Medical History:  Diagnosis Date  . Cancer ([place])   . History of radiation therapy 01/07/18- 02/09/18   Neck/ 36 Gy in 18 fractions, Chest/ 43.2 Gy in 24 fractions  . Medical history non-contributory   . Non Hodgkin's lymphoma ([place])     Allergies No Known Allergies  IV Location/Drains/Wounds Patient Lines/Drains/Airways Status   Active Line/Drains/Airways    Name:   Placement date:   Placement time:   Site:   Days:   Implanted Port 07/29/17 Left Chest   07/29/17    1039    Chest   235   Peripheral IV 03/08/2018 Right Antecubital   03/16/2018    1040    Antecubital   less than 1   Peripheral IV 04/05/2018 Left Antecubital   03/08/2018    1100    Antecubital   less than 1   NG/OG Tube Orogastric 18 Fr. Center mouth Xray;Aucultation Measured external length of tube   03/25/2018    1341    Center mouth   less than 1   Urethral Catheter [place] P EMT Latex 16 Fr.   03/08/2018    1217    Latex   less than 1   Airway   07/13/17    1103     251   Airway   03/28/2018    1303     less than 1   Incision  (Closed) 07/13/17 Neck Right   07/13/17    1055     251   Incision (Closed) 07/29/17 Back Lower   07/29/17    1042     235   Incision (Closed) 07/29/17 Chest Left;Upper   07/29/17    1044     235          Labs/Imaging Results for orders placed or performed during the hospital encounter of 03/31/2018 (from the past 48 hour(s))  Comprehensive metabolic panel     Status: Abnormal   Collection Time: 04/06/2018 10:50 AM  Result Value Ref Range   Sodium 144 135 - 145 mmol/L   Potassium 5.6 (H) 3.5 - 5.1 mmol/L   Chloride 111 101 - 111 mmol/L   CO2 18 (L) 22 - 32 mmol/L   Glucose, Bld 130 (H) 65 - 99 mg/dL   BUN 54 (H) 6 - 20 mg/dL   Creatinine, Ser 1.64 (H) 0.61 - 1.24 mg/dL   Calcium 7.6 (L) 8.9 - 10.3 mg/dL   Total Protein 6.3 (L) 6.5 - 8.1 g/dL   Albumin 2.5 (L) 3.5 - 5.0 g/dL   AST 617 (H) 15 -  41 U/L   ALT 537 (H) 17 - 63 U/L   Alkaline Phosphatase 83 38 - 126 U/L   Total Bilirubin 1.8 (H) 0.3 - 1.2 mg/dL   GFR calc non Af Amer 40 (L) >60 mL/min   GFR calc Af Amer 46 (L) >60 mL/min    Comment: (NOTE) The eGFR has been calculated using the CKD EPI equation. This calculation has not been validated in all clinical situations. eGFR's persistently <60 mL/min signify possible Chronic Kidney Disease.    Anion gap 15 5 - 15    Comment: Performed at [facility], [place] [address], [place], [place] [address]  CBC WITH DIFFERENTIAL     Status: Abnormal   Collection Time: 03/12/2018 10:50 AM  Result Value Ref Range   WBC 6.7 4.0 - 10.5 K/uL   RBC 3.41 (L) 4.22 - 5.81 MIL/uL   Hemoglobin 10.8 (L) 13.0 - 17.0 g/dL   HCT 33.2 (L) 39.0 - 52.0 %   MCV 97.4 78.0 - 100.0 fL   MCH 31.7 26.0 - 34.0 pg   MCHC 32.5 30.0 - 36.0 g/dL   RDW 16.4 (H) 11.5 - 15.5 %   Platelets 88 (L) 150 - 400 K/uL    Comment: REPEATED TO VERIFY SPECIMEN CHECKED FOR CLOTS PLATELET COUNT CONFIRMED BY SMEAR    Neutrophils Relative % 79 %   Lymphocytes Relative 7 %   Monocytes Relative 14 %    Eosinophils Relative 0 %   Basophils Relative 0 %   Neutro Abs 5.3 1.7 - 7.7 K/uL   Lymphs Abs 0.5 (L) 0.7 - 4.0 K/uL   Monocytes Absolute 0.9 0.1 - 1.0 K/uL   Eosinophils Absolute 0.0 0.0 - 0.7 K/uL   Basophils Absolute 0.0 0.0 - 0.1 K/uL   RBC Morphology POLYCHROMASIA PRESENT     Comment: Performed at [facility], [place] [address] [place], [place] [address]  Urinalysis, Routine w reflex microscopic     Status: Abnormal   Collection Time: 03/18/2018 10:50 AM  Result Value Ref Range   Color, Urine AMBER (A) YELLOW    Comment: BIOCHEMICALS MAY BE AFFECTED BY COLOR   APPearance HAZY (A) CLEAR   Specific Gravity, Urine 1.026 1.005 - 1.030   pH 5.0 5.0 - 8.0   Glucose, UA NEGATIVE NEGATIVE mg/dL   Hgb urine dipstick SMALL (A) NEGATIVE   Bilirubin Urine NEGATIVE NEGATIVE   Ketones, ur NEGATIVE NEGATIVE mg/dL   Protein, ur 100 (A) NEGATIVE mg/dL   Nitrite NEGATIVE NEGATIVE   Leukocytes, UA NEGATIVE NEGATIVE   RBC / HPF 0-5 0 - 5 RBC/hpf   WBC, UA 0-5 0 - 5 WBC/hpf   Bacteria, UA FEW (A) NONE SEEN   Squamous Epithelial / LPF NONE SEEN NONE SEEN   Hyaline Casts, UA PRESENT     Comment: Performed at [facility], [place] [address] [place], [place] [address]  Troponin I     Status: Abnormal   Collection Time: 03/12/2018 10:50 AM  Result Value Ref Range   Troponin I 5.23 (HH) <0.03 ng/mL    Comment: CRITICAL RESULT CALLED TO, READ BACK BY AND VERIFIED WITH: K,HALL,RN 03/12/2018 _0  BY V.WILKINS Performed at [place] [address] [place], [place] [address]   Brain natriuretic peptide     Status: Abnormal   Collection Time: 03/14/2018 10:50 AM  Result Value Ref Range   B Natriuretic Peptide 621.7 (H) 0.0 - 100.0 pg/mL    Comment: Performed  at [facility], [place] [address], [place], [place] [address]  I-Stat CG4 Lactic Acid, ED  (not at  [facility])     Status: Abnormal   Collection Time: 03/31/2018 10:53 AM  Result  Value Ref Range   Lactic Acid, Venous 4.82 (HH) 0.5 - 1.9 mmol/L   Comment NOTIFIED PHYSICIAN   Blood gas, arterial ([facility], [facility], [facility])     Status: Abnormal   Collection Time: 03/22/2018 11:10 AM  Result Value Ref Range   FIO2 100.00    Delivery systems BILEVEL POSITIVE AIRWAY PRESSURE    Mode BILEVEL POSITIVE AIRWAY PRESSURE    LHR 12 resp/min   Inspiratory PAP 12.0    Expiratory PAP 8.0    pH, Arterial 7.429 7.350 - 7.450   pCO2 arterial 28.7 (L) 32.0 - 48.0 mmHg   pO2, Arterial 64.2 (L) 83.0 - 108.0 mmHg   Bicarbonate 18.9 (L) 20.0 - 28.0 mmol/L   Acid-base deficit 4.2 (H) 0.0 - 2.0 mmol/L   O2 Saturation 89.5 %   Patient temperature 97.6    Collection site RIGHT RADIAL    Drawn by [id number]    Sample type ARTERIAL DRAW    Allens test (pass/fail) PASS PASS    Comment: Performed at [facility], [place] [address], [place], [place] [address]  I-Stat CG4 Lactic Acid, ED  (not at  [facility])     Status: Abnormal   Collection Time: 03/18/2018  1:56 PM  Result Value Ref Range   Lactic Acid, Venous 3.24 (HH) 0.5 - 1.9 mmol/L   Comment NOTIFIED PHYSICIAN   Blood gas, arterial     Status: Abnormal   Collection Time: 03/28/2018  2:33 PM  Result Value Ref Range   FIO2 100.00    Delivery systems VENTILATOR    Mode PRESSURE REGULATED VOLUME CONTROL    VT 620 mL   LHR 22 resp/min   Peep/cpap 5.0 cm H20   pH, Arterial 7.323 (L) 7.350 - 7.450   pCO2 arterial 40.8 32.0 - 48.0 mmHg   pO2, Arterial 78.2 (L) 83.0 - 108.0 mmHg   Bicarbonate 20.7 20.0 - 28.0 mmol/L   Acid-base deficit 4.7 (H) 0.0 - 2.0 mmol/L   O2 Saturation 91.6 %   Patient temperature 97.6    Collection site RIGHT RADIAL    Drawn by [id number]    Sample type ARTERIAL DRAW     Comment: Performed at [place] [address], [place],  [address]   Dg Chest Portable 1 View  Result Date: 03/19/2018 CLINICAL DATA:  ETT and OG tube placement. EXAM: PORTABLE CHEST 1 VIEW COMPARISON:  Chest x-ray from  same day at 10:45 a.m. FINDINGS: Interval placement of an endotracheal tube with the tip approximately 4.2 cm above the level of the carina. Enteric tube in place with the distal side port near the gastroesophageal junction. Unchanged left chest wall port catheter with the tip in the distal SVC. Stable cardiomegaly. Unchanged perihilar and left mid lung interstitial thickening. No pleural effusion or pneumothorax. No acute osseous abnormality. IMPRESSION: 1. Appropriately positioned endotracheal tube. 2. Enteric tube in place with the distal side port near the gastroesophageal junction. Recommend advancement 2-3 cm. 3. Unchanged perihilar and left mid lung interstitial opacities which may reflect edema or interstitial pneumonitis related to infectious or inflammatory process. Electronically Signed   By: Titus Dubin M.D.   On: 03/13/2018 13:30   Dg Chest Port 1 View  Result Date: 04/04/2018 CLINICAL DATA:  Found  unresponsive at home. History of cough. History of non-Hodgkin's lymphoma. EXAM: PORTABLE CHEST 1 VIEW COMPARISON:  Chest radiograph 10/05/2017 FINDINGS: Patient has a left jugular Port-A-Cath and the tip is in a stable position within the SVC. Mildly decreased lung volumes with increased interstitial densities in both lungs. Interstitial densities are most prominent in the left mid lung region. Cardiomediastinal silhouette is prominent but may be accentuated by AP technique. IMPRESSION: Interstitial densities in both lungs most prominent in the left lung. Findings are suggestive for interstitial edema. Cannot exclude an infectious or inflammatory process, particularly in the left lung. Question mild cardiomegaly. Electronically Signed   By: Markus Daft M.D.   On: 04/02/2018 11:08    Pending Labs Unresulted Labs (From admission, onward)   Start     Ordered   03/19/2018 1039  Blood Culture (routine x 2)  BLOOD CULTURE X 2,   STAT     03/13/2018 1039      Vitals/Pain Today's Vitals   03/30/2018  1355 03/19/2018 1400 04/04/2018 1410 03/18/2018 1441  BP: (!) 117/91 (!) 124/101 (!) 129/98 (!) 134/102  Pulse:    (!) 121  Resp: (!) 22 (!) 22 (!) 22 (!) 22  Temp:      TempSrc:      SpO2: 93% 94% 96% 97%  Weight:      Height:        Isolation Precautions No active isolations  Medications Medications  0.9 %  sodium chloride infusion (0 mLs Intravenous Stopped 03/20/2018 1332)  0.9 %  sodium chloride infusion ( Intravenous New Bag/Given 04/05/2018 1332)  piperacillin-tazobactam (ZOSYN) IVPB 3.375 g (0 g Intravenous Stopped 03/26/2018 1136)  vancomycin (VANCOCIN) IVPB 1000 mg/200 mL premix (0 mg Intravenous Stopped 03/10/2018 1257)  propofol (DIPRIVAN) 1000 MG/100ML infusion (10 mcg/kg/min  71.8 kg Intravenous Rate/Dose Change 03/23/2018 1547)  propofol (DIPRIVAN) 10 mg/mL bolus/IV push 35.9 mg (35.9 mg Intravenous Given 03/17/2018 1329)  etomidate (AMIDATE) injection 20 mg (20 mg Intravenous Given 03/31/2018 1259)  rocuronium (ZEMURON) injection 80 mg (80 mg Intravenous Given 03/29/2018 1259)    Mobility non-ambulatory

## 2018-03-21 NOTE — ED Notes (Signed)
[facility] called to question why pt did not receive the recommended amt of fluids for sepsis. It was explained to them, per Dr Venita Sheffield that pt was never hypotensive, was wet from CHF and d/t troponin over 5, pt may have also had MI. This was the reasoning behind the conservative fluids. Dr Venita Sheffield also advised that all of this will be in his notes regarding pt care.

## 2018-03-21 NOTE — ED Triage Notes (Signed)
Transported by [facility] from home--per EMS, patient has been taking Robitussin x 3 days for cough, family found patient unresponsive this morning and called EMS. Upon arrival patient responsive to pain/verbal stimuli. Hx of Non-Hodgkin's lymphoma.

## 2018-03-21 NOTE — Progress Notes (Addendum)
eLink Physician-Brief Progress Note Patient Name: Kamil Mchaffie DOB: 26-Apr-1946 MRN: [id number]   Date of Service  03/20/2018  HPI/Events of Note  Camera care - RN said vitals stable. He has higher Ve demand. Sedated. Care linke here to get him to go to cath lab  eICU Interventions  x     Intervention Category Minor Interventions: Other:  Brand Males 03/17/2018, 7:43 PM

## 2018-03-21 NOTE — ED Notes (Signed)
Report given to Manuela Schwartz, RN. Care transferred at this time.

## 2018-03-21 NOTE — H&P (Signed)
PULMONARY / CRITICAL CARE MEDICINE   Name: Todd Bell MRN: [id number] DOB: 01-25-1946    ADMISSION DATE:  03/14/2018 CONSULTATION DATE: 03/28/2018  REFERRING MD: [facility]  CHIEF COMPLAINT: Shock and respiratory failure.  HISTORY OF PRESENT ILLNESS:   Was not able to obtain a history from the patient as he was intubated.  Information is conveyed by the emergency physician and the patient's spouse.  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  He is followed by Dr. Lebron Conners and is scheduled for a PET scan in the near future.  His wife reports a 5-day history of dyspnea without chest pain possibly associated with orthopnea.  Yesterday he appeared diaphoretic and clammy.    He was found unresponsive this morning.  Saturations were in the 73s and blood pressure was initially normal.  He was intubated and treatment was started for a presumptive diagnosis of community-acquired pneumonia.  Upon returning from a CT scan of the chest he became increasingly hypotensive and hypoxic.  I initiated norepinephrine in the ED and increased his PEEP.  He was paralyzed to improve ventilator synchrony.  He was transported to the ICU    PAST MEDICAL HISTORY :  He  has a past medical history of Cancer [facility]), History of radiation therapy (01/07/18- 02/09/18), Medical history non-contributory, and Non Hodgkin's lymphoma ([place]).  PAST SURGICAL HISTORY: He  has a past surgical history that includes Colonoscopy (02/2016); Cataract extraction; Lymph node biopsy (Right, 07/13/2017); IR FLUORO GUIDE PORT INSERTION LEFT (07/29/2017); and IR US Guide Vasc Access Left (07/29/2017).  No Known Allergies  No current facility-administered medications on file prior to encounter.    Current Outpatient Medications on File Prior to Encounter  Medication Sig   . acetaminophen (TYLENOL) 325 MG tablet Take 650 mg by mouth every 6 (six) hours as needed.  . cetirizine (ZYRTEC) 10 MG tablet Take 10 mg by mouth daily.  . Pseudoephedrine-DM-GG-APAP (ROBITUSSIN COLD/COUGH/FLU PO) Take 15 mLs by mouth.  Marland Kitchen aspirin 81 MG chewable tablet Chew by mouth.  . lidocaine (XYLOCAINE) 2 % solution Patient: Mix 1part 2% viscous lidocaine, 1part H20. Swish & swallow 39mL of diluted mixture, 92min before meals and at bedtime, up to QID for soreness (Patient not taking: Reported on 02/23/2018)    FAMILY HISTORY:  His indicated that his father is deceased.   SOCIAL HISTORY: He  reports that he quit smoking about 31 years ago. He has never used smokeless tobacco. He reports that he drinks alcohol. He reports that he does not use drugs.  REVIEW OF SYSTEMS:   Review of systems is otherwise negative apart from what is mentioned in the HPI.  SUBJECTIVE:  The patient is currently unresponsive.  VITAL SIGNS: BP 101/76   Pulse (!) 120   Temp 99.3 F (37.4 C) (Rectal)   Resp (!) 26   Ht 6' (1.829 m)   Wt 158 lb 4.6 oz (71.8 kg)   SpO2 92%   BMI 21.47 kg/m   HEMODYNAMICS:  On norepinephrine 12 mcg/min  VENTILATOR SETTINGS: Vent Mode: PRVC FiO2 (%):  [100 %] 100 % Set Rate:  [22 bmp-25 bmp] 25 bmp Vt Set:  [500 mL-620 mL] 500 mL PEEP:  [5 cmH20-12 cmH20] 12 cmH20 Plateau Pressure:  [22 cmH20-34 cmH20] 27 cmH20  INTAKE / OUTPUT: No intake/output data recorded.  PHYSICAL EXAMINATION:  General: Thin man appearing older than his stated age. Neuro: Pharmacologically sedated, neuromuscular blockade. HEENT: Endotracheal tube in good position Cardiovascular: Extremities warm but capillary refill is diminished.  Tachycardic in sinus rhythm.  Heart sounds are unremarkable.  Point-of-care echocardiogram shows moderate to severe LV dysfunction with septal akinesis there is moderate to severe tricuspid regurgitation RV function is moderately reduced.  I was not able to  interrogate the mitral valve. Lungs: Initially poor oxygen saturation with ventilator dyssynchrony.  Improved with neuro muscular blockade.  Patient is easy to ventilate.  Air entry is diminished at both bases.  No adventitial sounds. Abdomen: Soft nontender with no organomegaly or masses. Musculoskeletal: No active joint disease. Skin: Skin is pale but intact.  LABS:  BMET Recent Labs  Lab 03/23/2018 1050  NA 144  K 5.6*  CL 111  CO2 18*  BUN 54*  CREATININE 1.64*  GLUCOSE 130*    Electrolytes Recent Labs  Lab 03/28/2018 1050  CALCIUM 7.6*    CBC Recent Labs  Lab 03/10/2018 1050  WBC 6.7  HGB 10.8*  HCT 33.2*  PLT 88*    Coag's No results for input(s): APTT, INR in the last 168 hours.  Sepsis Markers Recent Labs  Lab 04/01/2018 1053 04/06/2018 1356  LATICACIDVEN 4.82* 3.24*    ABG Recent Labs  Lab 03/13/2018 1110 03/08/2018 1433 03/09/2018 1813  PHART 7.429 7.323* 7.236*  PCO2ART 28.7* 40.8 52.4*  PO2ART 64.2* 78.2* 71.8*    Liver Enzymes Recent Labs  Lab 03/23/2018 1050  AST 617*  ALT 537*  ALKPHOS 83  BILITOT 1.8*  ALBUMIN 2.5*    Cardiac Enzymes Recent Labs  Lab 03/27/2018 1050  TROPONINI 5.23*    Glucose No results for input(s): GLUCAP in the last 168 hours.  Imaging Ct Chest Wo Contrast  Result Date: 04/05/2018 CLINICAL DATA:  Respiratory distress EXAM: CT CHEST WITHOUT CONTRAST TECHNIQUE: Multidetector CT imaging of the chest was performed following the standard protocol without IV contrast. COMPARISON:  Chest radiograph dated 04/05/2018. PET-CT dated 12/24/2017. CT chest dated 10/23/2017. FINDINGS: Cardiovascular: The heart is normal in size. No pericardial effusion. No evidence of thoracic aortic aneurysm. Atherosclerotic calcifications of the aortic arch. Coronary atherosclerosis the LAD and left circumflex. Left chest port terminates in the lower SVC. Mediastinum/Nodes: No suspicious mediastinal lymphadenopathy. Visualized thyroid is  unremarkable. Lungs/Pleura: Endotracheal tube terminates 3.8 cm above the carina. Ground-glass opacity in the bilateral upper and lower lobes in a perihilar distribution with coarsened interstitial markings. Associated traction bronchiectasis. This appearance favors chronic interstitial lung disease which is progressive. The appearance does not favor a UIP pattern/idiopathic pulmonary fibrosis but may reflect NSIP. Calcified pleural plaques in the anterior right hemithorax. No pleural effusion or pneumothorax. Upper Abdomen: Enteric tube courses into the proximal stomach. Musculoskeletal: Mild degenerative changes of the visualized thoracolumbar spine. IMPRESSION: Endotracheal tube terminates 3.8 cm above the carina. Scattered ground-glass opacity with coarse interstitial markings and bronchiectasis, favoring progressive chronic interstitial lung disease. Aortic Atherosclerosis (ICD10-I70.0). Electronically Signed   By: Julian Hy M.D.   On: 04/01/2018 16:59   Dg Chest Portable 1 View  Result Date: 03/20/2018 CLINICAL DATA:  Hypoxia EXAM: PORTABLE CHEST 1 VIEW COMPARISON:  Chest radiograph and chest CT obtained earlier in the day FINDINGS: Endotracheal tube tip is 4.0 cm above the carina. Nasogastric tube tip and side port are below the diaphragm with side port seen in the stomach. Port-A-Cath tip in superior vena cava. No pneumothorax. There is hazy opacity in both mid lungs.  Heart is mildly enlarged with pulmonary vascularity within normal limits. No adenopathy. No bone lesions. IMPRESSION: Hazy opacity in both mid lungs, similar to earlier in the day. Suspect a degree of underlying pneumonitis. Stable cardiac prominence. Tube and catheter positions as described without pneumothorax. Electronically Signed   By: Lowella Grip III M.D.   On: 04/04/2018 16:49   Dg Chest Portable 1 View  Result Date: 03/09/2018 CLINICAL DATA:  ETT and OG tube placement. EXAM: PORTABLE CHEST 1 VIEW COMPARISON:  Chest  x-ray from same day at 10:45 a.m. FINDINGS: Interval placement of an endotracheal tube with the tip approximately 4.2 cm above the level of the carina. Enteric tube in place with the distal side port near the gastroesophageal junction. Unchanged left chest wall port catheter with the tip in the distal SVC. Stable cardiomegaly. Unchanged perihilar and left mid lung interstitial thickening. No pleural effusion or pneumothorax. No acute osseous abnormality. IMPRESSION: 1. Appropriately positioned endotracheal tube. 2. Enteric tube in place with the distal side port near the gastroesophageal junction. Recommend advancement 2-3 cm. 3. Unchanged perihilar and left mid lung interstitial opacities which may reflect edema or interstitial pneumonitis related to infectious or inflammatory process. Electronically Signed   By: Titus Dubin M.D.   On: 03/20/2018 13:30   Dg Chest Port 1 View  Result Date: 03/20/2018 CLINICAL DATA:  Found unresponsive at home. History of cough. History of non-Hodgkin's lymphoma. EXAM: PORTABLE CHEST 1 VIEW COMPARISON:  Chest radiograph 10/05/2017 FINDINGS: Patient has a left jugular Port-A-Cath and the tip is in a stable position within the SVC. Mildly decreased lung volumes with increased interstitial densities in both lungs. Interstitial densities are most prominent in the left mid lung region. Cardiomediastinal silhouette is prominent but may be accentuated by AP technique. IMPRESSION: Interstitial densities in both lungs most prominent in the left lung. Findings are suggestive for interstitial edema. Cannot exclude an infectious or inflammatory process, particularly in the left lung. Question mild cardiomegaly. Electronically Signed   By: Markus Daft M.D.   On: 04/02/2018 11:08     STUDIES:  Serial EKGs show dynamic anterior T wave inversions most prominently in leads V1 through V4.  Some normalization has occurred with improved blood pressure.  CULTURES: Blood cultures are  pending  ANTIBIOTICS: Currently on vancomycin and Zosyn   LINES/TUBES: Foley catheter, 7.5 mm endotracheal tube, right radial arterial line, pressors infusing through left subclavian Port-A-Cath.  DISCUSSION: Severe shock initially thought to be due to to sepsis from pneumonia.  However, sepsis screen score is low and chest imaging more consistent with interstitial edema.  Furthermore, patient has a significantly elevated troponin, new and dynamic anterior T wave inversions and a focused echocardiogram showing septal akinesis consistent with infarction in the area seen on the EKGs.  This case has been discussed with Dr. Ellyn Hack in interventional cardiology at [facility] who concurs that the patient would best be served in an intervention capable cardiac center.  We will be arranging [facility] transportation.  ASSESSMENT / PLAN:  PULMONARY A: Acute hypoxic respiratory failure due to acute pulmonary edema. P:   -Lung protective low tidal volume ventilation. -PEEP optimized to improve oxygenation. -Sedation /-neuromuscular blockade to maintain ventilator synchrony.  CARDIOVASCULAR A:  Acute cardiogenic shock, initially cold and wet hemodynamic profile.  Perfusion has improved with initiation of norepinephrine.  Differential diagnosis includes recent anterior acute coronary syndrome or chemotherapy-induced cardiomyopathy. P:  -We will treat as ACS at present: Given ASA and started on heparin infusion. -Transfer  to [facility] heart center for further management.  RENAL A:   Evolving acute kidney injury initially.  Urine output has improved with restoration of normal blood pressure. P:   Continue to monitor urine output.  At increased risk for contrast-induced nephropathy.  GASTROINTESTINAL A:   No active abdominal process.  Appears adequately nourished. P:   We will need initiation of critical care nutrition once stabilized.  HEMATOLOGIC A:   Anemia of acute illness as well  as hematologic malignancy.  New thrombocytopenia, possibly due to acute marrow suppression from acute illness. P:  No current indications for transfusion.  Will proceed with usual ACS antiplatelet and heparin therapy.  Will hold on 2b3a agent for now  INFECTIOUS A:   Shock state which on further examination does not appear to be infectious in origin. P:   Serial pro-calcitonin is pending.  Continue antibiotics until clinical situation is clarified.  ENDOCRINE A:   Acceptable  glycemic control P:   Initiate insulin therapy to maintain euglycemia.  NEUROLOGIC A:   Neurologically normal at baseline.  Currently sedated to ensure comfort and ventilator compliance. P:   RASS goal: -3 Continue fentanyl infusion with as needed midazolam.   FAMILY  - Updates: Family updated at bedside.  Aware that the patient is critically ill and may not survive this hospitalization.  Understand that his clinical situation is not entirely clear and that ultimately a decision may be made not to perform any cardiac intervention, but that prudence dictates that he should be in a cardiac capable facility.  This patient is critically ill due to respiratory failure and cardiac shock requiring mechanical ventilation and titration of vasopressors.  Total critical care time not including separately billable procedures but including coordination of care: 90 minutes.  Pulmonary and [place] Pager: [phone]  03/16/2018, 6:30 PM

## 2018-03-21 NOTE — ED Notes (Addendum)
70mg  of propofol wasted in sink with Levander Campion, RN to be Sports coach.

## 2018-03-21 NOTE — ED Notes (Signed)
Intubation required for resp distress. RSI as follows: Etomidate 20 mg @ 1259 Rocuronium 80mg  @ 0321 Dr Venita Sheffield intubated with resp at bedside @ 1300 24 @ lip + color change  Intubation complete @ 1302

## 2018-03-21 NOTE — ED Notes (Signed)
Levophed gtt managed by Gwendolyn Fill, RN charge from ICU and Manuela Schwartz, RN charge from the ER

## 2018-03-21 NOTE — Procedures (Signed)
Arterial Catheter Insertion Procedure Note Todd Bell [id number] [birth year]  Procedure: Insertion of Arterial Catheter  Indications: Blood pressure monitoring  Procedure Details Consent: Risks of procedure as well as the alternatives and risks of each were explained to the (patient/caregiver).  Consent for procedure obtained. Time Out: Verified patient identification, verified procedure, site/side was marked, verified correct patient position, special equipment/implants available, medications/allergies/relevent history reviewed, required imaging and test results available.  Performed  Maximum sterile technique was used including cap, gloves, gown, hand hygiene and mask. Skin prep: Chlorhexidine; local anesthetic administered 22 gauge catheter was inserted into right radial artery using the Seldinger technique. ULTRASOUND GUIDANCE USED: YES Evaluation Blood flow good; BP tracing good. Complications: No apparent complications.   Todd Bell 03/13/2018

## 2018-03-21 NOTE — ED Provider Notes (Signed)
[facility] DEPT Provider Note   CSN: [id number] Arrival date & time: 03/20/2018  1025     History   Chief Complaint Chief Complaint  Patient presents with  . Code Sepsis    HPI Todd Bell is a 72 y.o. male.  Patient brought in by EMS from home.  Patient has a history of diffuse large B-cell lymphoma.  Has completed a course of chemo and a course of radiation treatment.  Recently.  Patient's wife stated that he had been not been feeling well have a lot of cough since Wednesday.  She tried to get him to come in and get evaluated but refused this morning he was essentially unresponsive.  He had coughed a lot during the night.  Patient is a full code.  Patient oxygen sats when EMS arrived were in the 56s.  Placed 100% nonrebreather with a said brought him into the low 90s when he arrived he was satting in the upper 80s on 100% nonrebreather respiratory rate was very fast.  Blood pressure had been fine at home and is fine upon arrival.     Past Medical History:  Diagnosis Date  . Cancer ([place])   . History of radiation therapy 01/07/18- 02/09/18   Neck/ 36 Gy in 18 fractions, Chest/ 43.2 Gy in 24 fractions  . Medical history non-contributory   . Non Hodgkin's lymphoma [facility])     Patient Active Problem List   Diagnosis Date Noted  . Diffuse large B-cell lymphoma of lymph nodes of neck ([place]) 07/17/2017    Past Surgical History:  Procedure Laterality Date  . CATARACT EXTRACTION    . COLONOSCOPY  02/2016  . IR FLUORO GUIDE PORT INSERTION LEFT  07/29/2017  . IR US GUIDE VASC ACCESS LEFT  07/29/2017  . LYMPH NODE BIOPSY Right 07/13/2017   Procedure: OPEN NECK LYMPH NODE BIOPSY RIGHT SIDE;  Surgeon: Izora Gala, MD;  Location: [facility];  Service: ENT;  Laterality: Right;        Home Medications    Prior to Admission medications   Medication Sig Start Date End Date Taking? Authorizing Provider  acetaminophen (TYLENOL) 325 MG tablet  Take 650 mg by mouth every 6 (six) hours as needed.    [provider]  aspirin 81 MG chewable tablet Chew by mouth.    [provider]  lidocaine (XYLOCAINE) 2 % solution Patient: Mix 1part 2% viscous lidocaine, 1part H20. Swish & swallow 75mL of diluted mixture, 15min before meals and at bedtime, up to QID for soreness Patient not taking: Reported on 02/23/2018 01/18/18   Eppie Gibson, MD    Family History Family History  Problem Relation Age of Onset  . Cancer Father        Pancreatic    Social History Social History   Tobacco Use  . Smoking status: Former Smoker    Last attempt to quit: 1988    Years since quitting: 31.3  . Smokeless tobacco: Never Used  . Tobacco comment: he was a social drinker  Substance Use Topics  . Alcohol use: Yes    Comment: occasional  . Drug use: No     Allergies   Patient has no known allergies.   Review of Systems Review of Systems  Unable to perform ROS: Mental status change     Physical Exam Updated Vital Signs BP 90/71   Pulse (!) 113   Temp 99.3 F (37.4 C) (Rectal)   Resp (!) 47  Ht (P) 1.829 m (6')   Wt 71.8 kg (158 lb 4.6 oz)   SpO2 (P) 96%   BMI (P) 21.47 kg/m   Physical Exam  Constitutional: He is oriented to person, place, and time. He appears well-developed and well-nourished. He appears distressed.  HENT:  Head: Normocephalic and atraumatic.  Eyes: Pupils are equal, round, and reactive to light.  Neck: Neck supple.  Cardiovascular: Regular rhythm.  Tachycardic  Pulmonary/Chest: Breath sounds normal. He is in respiratory distress.  Abdominal: Soft. Bowel sounds are normal. There is no tenderness.  Musculoskeletal: Normal range of motion.  Neurological: He is alert and oriented to person, place, and time. No cranial nerve deficit or sensory deficit. He exhibits normal muscle tone. Coordination normal.  Skin: Skin is warm. He is not diaphoretic.  Nursing note and vitals reviewed.    ED  Treatments / Results  Labs (all labs ordered are listed, but only abnormal results are displayed) Labs Reviewed  COMPREHENSIVE METABOLIC PANEL - Abnormal; Notable for the following components:      Result Value   Potassium 5.6 (*)    CO2 18 (*)    Glucose, Bld 130 (*)    BUN 54 (*)    Creatinine, Ser 1.64 (*)    Calcium 7.6 (*)    Total Protein 6.3 (*)    Albumin 2.5 (*)    AST 617 (*)    ALT 537 (*)    Total Bilirubin 1.8 (*)    GFR calc non Af Amer 40 (*)    GFR calc Af Amer 46 (*)    All other components within normal limits  CBC WITH DIFFERENTIAL/PLATELET - Abnormal; Notable for the following components:   RBC 3.41 (*)    Hemoglobin 10.8 (*)    HCT 33.2 (*)    RDW 16.4 (*)    Platelets 88 (*)    Lymphs Abs 0.5 (*)    All other components within normal limits  URINALYSIS, ROUTINE W REFLEX MICROSCOPIC - Abnormal; Notable for the following components:   Color, Urine AMBER (*)    APPearance HAZY (*)    Hgb urine dipstick SMALL (*)    Protein, ur 100 (*)    Bacteria, UA FEW (*)    All other components within normal limits  TROPONIN I - Abnormal; Notable for the following components:   Troponin I 5.23 (*)    All other components within normal limits  BLOOD GAS, ARTERIAL - Abnormal; Notable for the following components:   pCO2 arterial 28.7 (*)    pO2, Arterial 64.2 (*)    Bicarbonate 18.9 (*)    Acid-base deficit 4.2 (*)    All other components within normal limits  BRAIN NATRIURETIC PEPTIDE - Abnormal; Notable for the following components:   B Natriuretic Peptide 621.7 (*)    All other components within normal limits  I-STAT CG4 LACTIC ACID, ED - Abnormal; Notable for the following components:   Lactic Acid, Venous 4.82 (*)    All other components within normal limits  CULTURE, BLOOD (ROUTINE X 2)  CULTURE, BLOOD (ROUTINE X 2)  BLOOD GAS, ARTERIAL  I-STAT CG4 LACTIC ACID, ED    EKG EKG Interpretation  Date/Time:  Sunday March 21 2018 12:04:49 EDT Ventricular  Rate:  117 PR Interval:    QRS Duration: 101 QT Interval:  346 QTC Calculation: 483 R Axis:   50 Text Interpretation:  Sinus tachycardia Repol abnrm suggests ischemia, anterior leads Baseline wander in lead(s) V3 No significant change  since last tracing Confirmed by Fredia Sorrow [phone]) on 04/02/2018 12:49:16 PM   Radiology Dg Chest Port 1 View  Result Date: 04/01/2018 CLINICAL DATA:  Found unresponsive at home. History of cough. History of non-Hodgkin's lymphoma. EXAM: PORTABLE CHEST 1 VIEW COMPARISON:  Chest radiograph 10/05/2017 FINDINGS: Patient has a left jugular Port-A-Cath and the tip is in a stable position within the SVC. Mildly decreased lung volumes with increased interstitial densities in both lungs. Interstitial densities are most prominent in the left mid lung region. Cardiomediastinal silhouette is prominent but may be accentuated by AP technique. IMPRESSION: Interstitial densities in both lungs most prominent in the left lung. Findings are suggestive for interstitial edema. Cannot exclude an infectious or inflammatory process, particularly in the left lung. Question mild cardiomegaly. Electronically Signed   By: Markus Daft M.D.   On: 03/19/2018 11:08    Procedures Procedure Name: Intubation Date/Time: 03/22/2018 6:37 PM Performed by: Fredia Sorrow, MD Pre-anesthesia Checklist: Emergency Drugs available, Suction available, Patient being monitored, Patient identified and Timeout performed Oxygen Delivery Method: Ambu bag Preoxygenation: Pre-oxygenation with 100% oxygen Induction Type: Rapid sequence Ventilation: Mask ventilation without difficulty Laryngoscope Size: Glidescope Tube size: 8.0 mm Number of attempts: 1 Placement Confirmation: ETT inserted through vocal cords under direct vision,  Breath sounds checked- equal and bilateral and CO2 detector Secured at: 24 cm Tube secured with: ETT holder      (including critical care time)  CRITICAL CARE Performed  by: Fredia Sorrow Total critical care time: 90 minutes Critical care time was exclusive of separately billable procedures and treating other patients. Critical care was necessary to treat or prevent imminent or life-threatening deterioration. Critical care was time spent personally by me on the following activities: development of treatment plan with patient and/or surrogate as well as nursing, discussions with consultants, evaluation of patient's response to treatment, examination of patient, obtaining history from patient or surrogate, ordering and performing treatments and interventions, ordering and review of laboratory studies, ordering and review of radiographic studies, pulse oximetry and re-evaluation of patient's condition.   Medications Ordered in ED Medications  0.9 %  sodium chloride infusion (1,000 mLs Intravenous New Bag/Given 03/14/2018 1100)  propofol (DIPRIVAN) 1000 MG/100ML infusion (has no administration in time range)  propofol (DIPRIVAN) 10 mg/mL bolus/IV push 35.9 mg (has no administration in time range)  piperacillin-tazobactam (ZOSYN) IVPB 3.375 g (0 g Intravenous Stopped 04/01/2018 1136)  vancomycin (VANCOCIN) IVPB 1000 mg/200 mL premix (0 mg Intravenous Stopped 03/31/2018 1257)     Initial Impression / Assessment and Plan / ED Course  I have reviewed the triage vital signs and the nursing notes.  Pertinent labs & imaging results that were available during my care of the patient were reviewed by me and considered in my medical decision making (see chart for details).    Initial working diagnosis was probably pneumonia.  Patient never hypotensive.  Lactic acid was greater than 4 so technically should had full fluid challenge but chest x-ray raise concerns about interstitial edema and his BNP was elevated so there was some concern that he may be fluid overloaded.  Patient never really developed any hypotension other than when he was on propofol drip.  Patient was initially  placed on BiPAP immediately upon arrival.  That did improve his oxygen levels and he was more comfortable he started to come around to talk more but he was still very rapid breathing rate.  Still up around 50s or 34s.  Patient was treated with broad-spectrum antibiotics.  Due  to the persistent rapid rate of respiratory he was eventually intubated.  Intubation went extremely smoothly he never became hypoxic.  Following intubation he was formally admitted to critical care.  However they wanted to get CT chest which I had ordered earlier prior him going up and he came back from CT chest patient again was have a rapid respiratory rate and is oxygen sats had dropped back down into the 70s.  Chest x-ray was repeated and showed no significant changes endotracheal tube was in good placement.  Patient was given some more propofol with settling down and oxygen sats came up into the 80s however his any got hypotensive.  At this point request was placed for critical care to come and see him because he had been admitted by them 3-1/2 hours ago.  Which they did and I took over care from there patient eventually went upstairs.  CT chest had no obvious findings.  Other than bilateral pneumonitis.  Throughout his stay patient's family was Updated a new from the beginning that the fact that he was breathing so fast  Was very problematic and very worrisome.   Final Clinical Impressions(s) / ED Diagnoses   Final diagnoses:  Acute respiratory failure with hypoxia ([place])  AKI (acute kidney injury) [facility])    ED Discharge Orders    None       Fredia Sorrow, MD 03/29/2018 1839

## 2018-03-21 NOTE — Progress Notes (Signed)
A consult was received from an ED physician for vanc/Zosyn per pharmacy dosing.  The patient's profile has been reviewed for ht/wt/allergies/indication/available labs.   A one time order has been placed for vanc 1g and Zosyn 3.375g.  Further antibiotics/pharmacy consults should be ordered by admitting physician if indicated.                       Thank you, Kara Mead 03/28/2018  10:40 AM

## 2018-03-21 NOTE — ED Notes (Signed)
Troponin 5.23

## 2018-03-21 NOTE — H&P (Signed)
History and Physical Interval Note:  NAME:  Todd Bell   MRN: [id number] DOB:  [birth year]   ADMIT DATE: 03/26/2018   03/16/2018 7:51 PM once he gets Todd Bell is a 71 y.o. male with a past medical history notable for B-cell lymphoma with partial response to systemic R CHOP followed by Todd Bell. The patient apparently has not been feeling well since about Wednesday, but denied any chest pain or significant symptoms of dyspnea.  Unfortunately he was found unresponsive earlier in the morning of 03/22/2018 with hypoxemic blood gas.  He was intubated with initial treatment for possible community acquired pneumonia and septic shock.  However he is becoming increasingly hypotensive and hypoxic and was started on pressors.  Upon arrival to the ICU, it became more apparent that he potentially had a lesser chance of having pneumonia but more potential pulmonary edema.  His troponin levels have gone from 5 to roughly 10 and his EKG shows signs concerning for anterior ischemia.  Dr. Lynetta Mare from [facility] is done a bedside echocardiogram to suggest possible anterior-septal wall motion normality, and is concerned that the patient is actually presenting with cardiogenic shock from recent MI.  Appears that there is EKG changes in the anterior leads are somewhat dynamic depending on his oxygenation and blood pressure status.  He is now stabilized on Levophed and vasopressin and has been started on vancomycin and Zosyn.  However based on the findings of positive troponin levels now increasing as well as ischemic EKG changes with regional wall motion normality it is prudent to prefer the patient be brought to the catheterization lab for definitive evaluation with right and left heart catheter in order to determine if there is truly cardiac component and if so would he benefit from either balloon pump versus Impella.  Dr. Rowland Lathe from advanced heart failure/stroke team is also in agreement with this  plan.  Past Medical History:  Diagnosis Date  . Cancer ([place])   . History of radiation therapy 01/07/18- 02/09/18   Neck/ 36 Gy in 18 fractions, Chest/ 43.2 Gy in 24 fractions  . Medical history non-contributory   . Non Hodgkin's lymphoma [facility])    Past Surgical History:  Procedure Laterality Date  . CATARACT EXTRACTION    . COLONOSCOPY  02/2016  . IR FLUORO GUIDE PORT INSERTION LEFT  07/29/2017  . IR US GUIDE VASC ACCESS LEFT  07/29/2017  . LYMPH NODE BIOPSY Right 07/13/2017   Procedure: OPEN NECK LYMPH NODE BIOPSY RIGHT SIDE;  Surgeon: Izora Gala, MD;  Location: [facility];  Service: ENT;  Laterality: Right;    FAMHx: Family History  Problem Relation Age of Onset  . Cancer Father        Pancreatic    SOCHx:  reports that he quit smoking about 31 years ago. He has never used smokeless tobacco. He reports that he drinks alcohol. He reports that he does not use drugs.  ALLERGIES: No Known Allergies  HOME MEDICATIONS: Medications Prior to Admission  Medication Sig Dispense Refill Last Dose  . acetaminophen (TYLENOL) 325 MG tablet Take 650 mg by mouth every 6 (six) hours as needed.   03/20/2018 at Unknown time  . cetirizine (ZYRTEC) 10 MG tablet Take 10 mg by mouth daily.   03/20/2018 at Unknown time  . Pseudoephedrine-DM-GG-APAP (ROBITUSSIN COLD/COUGH/FLU PO) Take 15 mLs by mouth.   03/20/2018 at Unknown time  . aspirin 81 MG chewable tablet Chew by mouth.   Not Taking at Unknown time  .  lidocaine (XYLOCAINE) 2 % solution Patient: Mix 1part 2% viscous lidocaine, 1part H20. Swish & swallow 43mL of diluted mixture, 13min before meals and at bedtime, up to QID for soreness (Patient not taking: Reported on 02/23/2018) 100 mL 5 Not Taking at Unknown time    PHYSICAL EXAM:Blood pressure 107/86, pulse (!) 116, temperature 98.4 F (36.9 C), temperature source Oral, resp. rate (!) 36, height 6' (1.829 m), weight 157 lb 3 oz (71.3 kg), SpO2 98 %. General appearance: Intubated and  sedated.  Chronically ill-appearing. Neck: JVD - 2 cm above sternal notch, no adenopathy, no carotid bruit and supple, symmetrical, trachea midline Lungs: Bibasilar rales with rhonchi.  Ventilator sounds. Heart: regular rate and rhythm, S1, S2 normal, no murmur, click, rub or gallop Abdomen: soft, non-tender; bowel sounds normal; no masses,  no organomegaly Extremities: extremities normal, atraumatic, no cyanosis or edema Pulses: 2+ and symmetric Skin: Skin color, texture, turgor normal. No rashes or lesions Neurologic: Mental status: Intubated and sedated.  Unable to assess Nontoxic   Adult ECG Report  Rate: 122 ;  Rhythm: sinus tachycardia and Anteroseptal ST depression with TWI - concerning for Ischemia   Narrative Interpretation: Abnormal EKG - consider Anterior Ischemia  IMPRESSION & PLAN Todd Bell is a relatively unfortunate gentleman with B-cell lymphoma possibly requiring further treatment to presented found unresponsive and hypoxic yesterday failure difficult to oxygenate and ending shock that appears to be cardiogenic as opposed to  septic shock.  EKG and troponin as well as a bedside echocardiogram would suggest an ischemic etiology.    He has been treated already with aspirin and heparin and is being transferred to [facility] for right and left heart catheterization and possible PCI with the decision that at that time to determine what if we do Impella versus balloon pump.  PLANNED PROCEDURES:   LEFT & RIGHT HEART CATHETERIZATION AND CORONARY ANGIOGRAPHY +/- AD HOC PERCUTANEOUS CORONARY INTERVENTION  INTRAAORTIC BALLOON PUMP VS. IMPELLA  as a surgical intervention.   We will proceed with the planned procedure.  Further plans will be based on findings of the heart cath lab.  He will also be followed by care for his respiratory function.  Further care following cath will be by the heart failure service.   [facility] [address] [place], [place]  [address]  [phone]  03/26/2018 7:51 PM

## 2018-03-21 NOTE — ED Notes (Signed)
RT has been called and is in route to facility.

## 2018-03-21 NOTE — ED Notes (Signed)
Bed: WV14 Expected date:  Expected time:  Means of arrival:  Comments: Hold 25, Sepsis

## 2018-03-21 NOTE — ED Notes (Signed)
Pt initially tolerated transport to CT scan however post scan pt was noticed to have labored breathing and was desatting into the 70s. Pt returned to [facility] room of the ER and ER doctors came to bedside to see pt's status change. Portable chest xray performed and pt given increase of sedation to relieve distress. Will continue to monitor.

## 2018-03-21 NOTE — Progress Notes (Signed)
ANTICOAGULATION CONSULT NOTE - Initial Consult  Pharmacy Consult for Heparin Indication: chest pain/ACS  No Known Allergies  Patient Measurements: Height: 6' (182.9 cm) Weight: 158 lb 4.6 oz (71.8 kg) IBW/kg (Calculated) : 77.6 Heparin Dosing Weight: TBW  Vital Signs: Temp: 99.3 F (37.4 C) (04/14 1207) Temp Source: Rectal (04/14 1207) BP: 101/76 (04/14 1721) Pulse Rate: 120 (04/14 1721)  Labs: Recent Labs    04/03/2018 1050  HGB 10.8*  HCT 33.2*  PLT 88*  CREATININE 1.64*  TROPONINI 5.23*    Estimated Creatinine Clearance: 40.1 mL/min (A) (by C-G formula based on SCr of 1.64 mg/dL (H)).   Medical History: Past Medical History:  Diagnosis Date  . Cancer ([place])   . History of radiation therapy 01/07/18- 02/09/18   Neck/ 36 Gy in 18 fractions, Chest/ 43.2 Gy in 24 fractions  . Medical history non-contributory   . Non Hodgkin's lymphoma ([place])     Medications:  Infusions:  . sodium chloride    . sodium chloride    . famotidine (PEPCID) IV    . heparin      Assessment: 17 yoM admitted on 4/14 with code sepsis, suspected ACS. No PTA anticoagulation Baseline caogs pending CBC: Hgb 10.8 (baseline ~ 11), Plt 88k (Normally 200-400s) Troponin 5.23 SCr 1.64 (baseline ~0.8) LFTs acutely elevated (AST/ALT 617/537, Tbili 1.8), normally WNL  Goal of Therapy:  Heparin level 0.3-0.7 units/ml Monitor platelets by anticoagulation protocol: Yes   Plan:   Baseline PTT, PT/INR  Give heparin 4000 units bolus IV x 1  Start heparin IV infusion at 850 units/hr  Heparin level 8 hours after starting  Daily heparin level and CBC  Continue to monitor H&H and platelets   Gretta Arab PharmD, BCPS Pager [phone] 03/14/2018 6:07 PM

## 2018-03-21 NOTE — Progress Notes (Signed)
Pharmacy Antibiotic Note  Todd Bell is a 72 y.o. male admitted on 04/04/2018 with shock, respiratory failure.  Pharmacy has been consulted for vancomycin and Zosyn dosing for HCAP  Plan:  Zosyn 3.375g IV Q8H infused over 4hrs.  Vancomycin 1g IV x1 dose then 750 mg IV q12h.  Measure Vanc trough at steady state.  Follow up renal fxn, culture results, and clinical course.   Height: 6' (182.9 cm) Weight: 157 lb 3 oz (71.3 kg) IBW/kg (Calculated) : 77.6  Temp (24hrs), Avg:98.4 F (36.9 C), Min:97.6 F (36.4 C), Max:99.3 F (37.4 C)  Recent Labs  Lab 03/16/2018 1050 04/02/2018 1053 03/29/2018 1356 03/11/2018 1822 03/10/2018 1904  WBC 6.7  --   --  8.3  --   CREATININE 1.64*  --   --  1.33*  --   LATICACIDVEN  --  4.82* 3.24*  --  1.9    Estimated Creatinine Clearance: 49.1 mL/min (A) (by C-G formula based on SCr of 1.33 mg/dL (H)).    No Known Allergies  Thank you for allowing pharmacy to be a part of this patient's care.  Leeroy Bock 04/06/2018 8:00 PM

## 2018-03-21 NOTE — ED Notes (Signed)
Bed: RESB Expected date:  Expected time:  Means of arrival:  Comments: 

## 2018-03-21 NOTE — Procedures (Signed)
Aline placed by Agarwala,R. Aline protocol met. Sterile drape,gown,hat gloves. Blood flow good and was able to pull off line. Aline dressed perprotocol

## 2018-03-21 NOTE — ED Notes (Signed)
Date and time results received: 03/29/2018 12:01 PM   Test: Troponin I Critical Value: 5.23  Name of Provider Notified: Dr Rogene Houston  Orders Received? Or Actions Taken?: Orders Received - See Orders for details

## 2018-03-21 NOTE — Procedures (Signed)
Arterial Catheter Insertion Procedure Note Todd Bell [id number] 1946/07/10  Procedure: Insertion of Arterial Catheter  Indications: Blood pressure monitoring  Procedure Details Consent: Risks of procedure as well as the alternatives and risks of each were explained to the (patient/caregiver).  Consent for procedure obtained. Time Out: Verified patient identification, verified procedure, site/side was marked, verified correct patient position, special equipment/implants available, medications/allergies/relevent history reviewed, required imaging and test results available.  Performed  Maximum sterile technique was used including antiseptics, gloves, gown, hand hygiene, mask and sheet. Skin prep: Chlorhexidine; local anesthetic administered 20 gauge catheter was inserted into right radial artery using the Seldinger technique. ULTRASOUND GUIDANCE USED: NO Evaluation Blood flow good; BP tracing good. Complications: No apparent complications.   Todd Bell Todd Bell 04/01/2018

## 2018-03-21 NOTE — ED Notes (Signed)
RT at bedside, attempting to obtain blood gas.

## 2018-03-21 NOTE — ED Notes (Signed)
Chaplain called. Chaplain is in route to facility.

## 2018-03-21 NOTE — Progress Notes (Signed)
Pt switched to PCV due to help synchronize his breathing.  On PRVC, pt was not ventilating well and seemed to be fighting the ventilation.  At this time, pt is pulling 6-700 VT and wave forms are more pronounced and defined rather that the slight peaks he had on PRVC.  RT given report in [place] and we will transport pt to his room shortly.  RT will continue to monitor.

## 2018-03-22 ENCOUNTER — Inpatient Hospital Stay (HOSPITAL_COMMUNITY): Payer: Medicare Other

## 2018-03-22 ENCOUNTER — Encounter (HOSPITAL_COMMUNITY): Payer: Self-pay | Admitting: Cardiology

## 2018-03-22 DIAGNOSIS — G9340 Encephalopathy, unspecified: Secondary | ICD-10-CM

## 2018-03-22 DIAGNOSIS — J9601 Acute respiratory failure with hypoxia: Secondary | ICD-10-CM

## 2018-03-22 DIAGNOSIS — A419 Sepsis, unspecified organism: Principal | ICD-10-CM

## 2018-03-22 DIAGNOSIS — R570 Cardiogenic shock: Secondary | ICD-10-CM

## 2018-03-22 DIAGNOSIS — E440 Moderate protein-calorie malnutrition: Secondary | ICD-10-CM

## 2018-03-22 DIAGNOSIS — R6521 Severe sepsis with septic shock: Secondary | ICD-10-CM

## 2018-03-22 LAB — COOXEMETRY PANEL
CARBOXYHEMOGLOBIN: 0.8 % (ref 0.5–1.5)
CARBOXYHEMOGLOBIN: 0.7 % (ref 0.5–1.5)
Carboxyhemoglobin: 0.8 % (ref 0.5–1.5)
METHEMOGLOBIN: 1.5 % (ref 0.0–1.5)
Methemoglobin: 0.8 % (ref 0.0–1.5)
Methemoglobin: 0.6 % (ref 0.0–1.5)
O2 SAT: 40.8 %
O2 SAT: 45.7 %
O2 SAT: 66.6 %
TOTAL HEMOGLOBIN: 10.4 g/dL — AB (ref 12.0–16.0)
TOTAL HEMOGLOBIN: 16.5 g/dL — AB (ref 12.0–16.0)
Total hemoglobin: 12.4 g/dL (ref 12.0–16.0)

## 2018-03-22 LAB — POCT I-STAT 3, ART BLOOD GAS (G3+)
ACID-BASE DEFICIT: 6 mmol/L — AB (ref 0.0–2.0)
ACID-BASE DEFICIT: 5 mmol/L — AB (ref 0.0–2.0)
ACID-BASE DEFICIT: 5 mmol/L — AB (ref 0.0–2.0)
ACID-BASE EXCESS: 4 mmol/L — AB (ref 0.0–2.0)
Acid-base deficit: 5 mmol/L — ABNORMAL HIGH (ref 0.0–2.0)
BICARBONATE: 29.4 mmol/L — AB (ref 20.0–28.0)
Bicarbonate: 20.1 mmol/L (ref 20.0–28.0)
Bicarbonate: 19.7 mmol/L — ABNORMAL LOW (ref 20.0–28.0)
Bicarbonate: 24.2 mmol/L (ref 20.0–28.0)
Bicarbonate: 21.1 mmol/L (ref 20.0–28.0)
O2 SAT: 91 %
O2 SAT: 96 %
O2 SAT: 97 %
O2 Saturation: 76 %
O2 Saturation: 100 %
PCO2 ART: 42.8 mmHg (ref 32.0–48.0)
PH ART: 7.186 — AB (ref 7.350–7.450)
PH ART: 7.291 — AB (ref 7.350–7.450)
PO2 ART: 102 mmHg (ref 83.0–108.0)
PO2 ART: 378 mmHg — AB (ref 83.0–108.0)
Patient temperature: 36.8
Patient temperature: 36.9
Patient temperature: 37.7
TCO2: 21 mmol/L — ABNORMAL LOW (ref 22–32)
TCO2: 21 mmol/L — ABNORMAL LOW (ref 22–32)
TCO2: 26 mmol/L (ref 22–32)
TCO2: 22 mmol/L (ref 22–32)
TCO2: 31 mmol/L (ref 22–32)
pCO2 arterial: 35.7 mmHg (ref 32.0–48.0)
pCO2 arterial: 63.8 mmHg — ABNORMAL HIGH (ref 32.0–48.0)
pCO2 arterial: 43.9 mmHg (ref 32.0–48.0)
pCO2 arterial: 47.7 mmHg (ref 32.0–48.0)
pH, Arterial: 7.279 — ABNORMAL LOW (ref 7.350–7.450)
pH, Arterial: 7.35 (ref 7.350–7.450)
pH, Arterial: 7.401 (ref 7.350–7.450)
pO2, Arterial: 70 mmHg — ABNORMAL LOW (ref 83.0–108.0)
pO2, Arterial: 42 mmHg — ABNORMAL LOW (ref 83.0–108.0)
pO2, Arterial: 97 mmHg (ref 83.0–108.0)

## 2018-03-22 LAB — PHOSPHORUS
Phosphorus: 4.7 mg/dL — ABNORMAL HIGH (ref 2.5–4.6)
Phosphorus: 3.9 mg/dL (ref 2.5–4.6)

## 2018-03-22 LAB — POCT I-STAT 3, VENOUS BLOOD GAS (G3P V)
ACID-BASE DEFICIT: 6 mmol/L — AB (ref 0.0–2.0)
Acid-base deficit: 5 mmol/L — ABNORMAL HIGH (ref 0.0–2.0)
Acid-base deficit: 6 mmol/L — ABNORMAL HIGH (ref 0.0–2.0)
BICARBONATE: 21.3 mmol/L (ref 20.0–28.0)
BICARBONATE: 21.5 mmol/L (ref 20.0–28.0)
BICARBONATE: 22.2 mmol/L (ref 20.0–28.0)
O2 SAT: 53 %
O2 SAT: 55 %
O2 SAT: 68 %
PCO2 VEN: 50.3 mmHg (ref 44.0–60.0)
PO2 VEN: 41 mmHg (ref 32.0–45.0)
TCO2: 23 mmol/L (ref 22–32)
TCO2: 23 mmol/L (ref 22–32)
TCO2: 24 mmol/L (ref 22–32)
pCO2, Ven: 48.5 mmHg (ref 44.0–60.0)
pCO2, Ven: 49 mmHg (ref 44.0–60.0)
pH, Ven: 7.25 (ref 7.250–7.430)
pH, Ven: 7.251 (ref 7.250–7.430)
pH, Ven: 7.253 (ref 7.250–7.430)
pO2, Ven: 33 mmHg (ref 32.0–45.0)
pO2, Ven: 34 mmHg (ref 32.0–45.0)

## 2018-03-22 LAB — BLOOD CULTURE ID PANEL (REFLEXED)
Acinetobacter baumannii: NOT DETECTED
CANDIDA KRUSEI: NOT DETECTED
CANDIDA TROPICALIS: NOT DETECTED
Candida albicans: NOT DETECTED
Candida glabrata: NOT DETECTED
Candida parapsilosis: NOT DETECTED
ENTEROCOCCUS SPECIES: NOT DETECTED
ESCHERICHIA COLI: NOT DETECTED
Enterobacter cloacae complex: NOT DETECTED
Enterobacteriaceae species: NOT DETECTED
Haemophilus influenzae: NOT DETECTED
KLEBSIELLA PNEUMONIAE: NOT DETECTED
Klebsiella oxytoca: NOT DETECTED
LISTERIA MONOCYTOGENES: NOT DETECTED
Methicillin resistance: DETECTED — AB
Neisseria meningitidis: NOT DETECTED
PROTEUS SPECIES: NOT DETECTED
Pseudomonas aeruginosa: NOT DETECTED
STAPHYLOCOCCUS AUREUS BCID: NOT DETECTED
STAPHYLOCOCCUS SPECIES: DETECTED — AB
Serratia marcescens: NOT DETECTED
Streptococcus agalactiae: NOT DETECTED
Streptococcus pneumoniae: NOT DETECTED
Streptococcus pyogenes: NOT DETECTED
Streptococcus species: NOT DETECTED

## 2018-03-22 LAB — BASIC METABOLIC PANEL
ANION GAP: 10 (ref 5–15)
BUN: 48 mg/dL — ABNORMAL HIGH (ref 6–20)
CALCIUM: 7.6 mg/dL — AB (ref 8.9–10.3)
CO2: 24 mmol/L (ref 22–32)
CREATININE: 1.19 mg/dL (ref 0.61–1.24)
Chloride: 110 mmol/L (ref 101–111)
GFR, EST NON AFRICAN AMERICAN: 58 mL/min — AB (ref >60)
GLUCOSE: 115 mg/dL — AB (ref 65–99)
Potassium: 4.5 mmol/L (ref 3.5–5.1)
Sodium: 144 mmol/L (ref 135–145)

## 2018-03-22 LAB — GLUCOSE, CAPILLARY
Glucose-Capillary: 98 mg/dL (ref 65–99)
Glucose-Capillary: 145 mg/dL — ABNORMAL HIGH (ref 65–99)

## 2018-03-22 LAB — CBC
HCT: 32.5 % — ABNORMAL LOW (ref 39.0–52.0)
Hemoglobin: 10.1 g/dL — ABNORMAL LOW (ref 13.0–17.0)
MCH: 31.4 pg (ref 26.0–34.0)
MCHC: 31.1 g/dL (ref 30.0–36.0)
MCV: 100.9 fL — AB (ref 78.0–100.0)
PLATELETS: 48 10*3/uL — AB (ref 150–400)
RBC: 3.22 MIL/uL — ABNORMAL LOW (ref 4.22–5.81)
RDW: 16.8 % — AB (ref 11.5–15.5)
WBC: 5.6 10*3/uL (ref 4.0–10.5)

## 2018-03-22 LAB — MAGNESIUM
Magnesium: 2.4 mg/dL (ref 1.7–2.4)
Magnesium: 2.4 mg/dL (ref 1.7–2.4)

## 2018-03-22 LAB — ECHOCARDIOGRAM COMPLETE
HEIGHTINCHES: 72 in
Weight: 2515.01 oz

## 2018-03-22 LAB — POCT ACTIVATED CLOTTING TIME: Activated Clotting Time: 175 seconds

## 2018-03-22 LAB — CORTISOL: CORTISOL PLASMA: 23.4 ug/dL

## 2018-03-22 MED ORDER — MIDAZOLAM HCL 2 MG/2ML IJ SOLN
2.0000 mg | Freq: Once | INTRAMUSCULAR | Status: AC
  Administered 2018-03-22: 2 mg via INTRAVENOUS

## 2018-03-22 MED ORDER — LACTATED RINGERS IV BOLUS
250.0000 mL | Freq: Once | INTRAVENOUS | Status: AC
  Administered 2018-03-22: 250 mL via INTRAVENOUS

## 2018-03-22 MED ORDER — SODIUM CHLORIDE 0.9 % IV SOLN
3.0000 ug/kg/min | INTRAVENOUS | Status: DC
  Administered 2018-03-22: 3 ug/kg/min via INTRAVENOUS
  Administered 2018-03-22: 2.5 ug/kg/min via INTRAVENOUS
  Filled 2018-03-22 (×2): qty 20

## 2018-03-22 MED ORDER — ACETAMINOPHEN 160 MG/5ML PO SOLN
650.0000 mg | Freq: Four times a day (QID) | ORAL | Status: DC | PRN
  Administered 2018-03-22 – 2018-03-24 (×4): 650 mg
  Filled 2018-03-22 (×2): qty 20.3

## 2018-03-22 MED ORDER — MIDAZOLAM HCL 2 MG/2ML IJ SOLN: 2.0000 mg | Freq: Once | INTRAMUSCULAR | Status: DC | PRN

## 2018-03-22 MED ORDER — MILRINONE LACTATE IN DEXTROSE 20-5 MG/100ML-% IV SOLN
0.1250 ug/kg/min | INTRAVENOUS | Status: DC
  Administered 2018-03-22 – 2018-03-23 (×2): 0.125 ug/kg/min via INTRAVENOUS
  Filled 2018-03-22 (×2): qty 100

## 2018-03-22 MED ORDER — SODIUM CHLORIDE 0.9 % IV SOLN
2.0000 mg/h | INTRAVENOUS | Status: DC
  Administered 2018-03-22: 2 mg/h via INTRAVENOUS
  Administered 2018-03-23 – 2018-03-24 (×2): 4 mg/h via INTRAVENOUS
  Filled 2018-03-22 (×4): qty 10

## 2018-03-22 MED ORDER — DEXTROSE 5 % IV SOLN
INTRAVENOUS | Status: DC
  Administered 2018-03-22: 05:00:00 via INTRAVENOUS
  Filled 2018-03-22 (×4): qty 150

## 2018-03-22 MED ORDER — SODIUM CHLORIDE 0.9 % IV SOLN: 0.0000 ug/min | INTRAVENOUS | Status: DC

## 2018-03-22 MED ORDER — DEXTROSE 5 % IV SOLN
0.0000 ug/min | INTRAVENOUS | Status: DC
  Administered 2018-03-22: 30 ug/min via INTRAVENOUS
  Administered 2018-03-22: 8 ug/min via INTRAVENOUS
  Administered 2018-03-22: 20 ug/min via INTRAVENOUS
  Filled 2018-03-22: qty 4

## 2018-03-22 MED ORDER — ACETAMINOPHEN 325 MG PO TABS: 650.0000 mg | ORAL_TABLET | Freq: Four times a day (QID) | ORAL | Status: DC | PRN

## 2018-03-22 MED ORDER — VITAL AF 1.2 CAL PO LIQD
1000.0000 mL | ORAL | Status: DC
  Administered 2018-03-22 – 2018-03-24 (×2): 1000 mL

## 2018-03-22 MED ORDER — FENTANYL 2500MCG IN NS 250ML (10MCG/ML) PREMIX INFUSION
100.0000 ug/h | INTRAVENOUS | Status: DC
  Administered 2018-03-22: 25 ug/h via INTRAVENOUS
  Administered 2018-03-22 – 2018-03-23 (×4): 250 ug/h via INTRAVENOUS
  Filled 2018-03-22 (×6): qty 250

## 2018-03-22 MED ORDER — FUROSEMIDE 10 MG/ML IJ SOLN
INTRAMUSCULAR | Status: AC
  Filled 2018-03-22: qty 4

## 2018-03-22 MED ORDER — NOREPINEPHRINE BITARTRATE 1 MG/ML IV SOLN
0.0000 ug/min | INTRAVENOUS | Status: DC
  Administered 2018-03-24 (×3): 40 ug/min via INTRAVENOUS
  Filled 2018-03-22 (×5): qty 16

## 2018-03-22 MED ORDER — SODIUM CHLORIDE 0.9 % IV BOLUS
1000.0000 mL | Freq: Once | INTRAVENOUS | Status: AC
  Administered 2018-03-22: 500 mL via INTRAVENOUS

## 2018-03-22 MED ORDER — FENTANYL CITRATE (PF) 100 MCG/2ML IJ SOLN
100.0000 ug | Freq: Once | INTRAMUSCULAR | Status: AC
  Administered 2018-03-22: 100 ug via INTRAVENOUS

## 2018-03-22 MED ORDER — FUROSEMIDE 10 MG/ML IJ SOLN
40.0000 mg | Freq: Once | INTRAMUSCULAR | Status: AC
  Administered 2018-03-22: 40 mg via INTRAVENOUS

## 2018-03-22 MED ORDER — SODIUM CHLORIDE 0.9 % IV SOLN
0.0000 ug/min | INTRAVENOUS | Status: DC
  Administered 2018-03-22: 50 ug/min via INTRAVENOUS
  Filled 2018-03-22: qty 10

## 2018-03-22 MED ORDER — ARTIFICIAL TEARS OPHTHALMIC OINT
1.0000 "application " | TOPICAL_OINTMENT | Freq: Three times a day (TID) | OPHTHALMIC | Status: DC
  Administered 2018-03-22 – 2018-03-23 (×5): 1 via OPHTHALMIC
  Filled 2018-03-22: qty 3.5

## 2018-03-22 MED ORDER — FENTANYL BOLUS VIA INFUSION
50.0000 ug | INTRAVENOUS | Status: DC | PRN
  Filled 2018-03-22: qty 50

## 2018-03-22 MED ORDER — MIDAZOLAM BOLUS VIA INFUSION
2.0000 mg | INTRAVENOUS | Status: DC | PRN
  Filled 2018-03-22: qty 2

## 2018-03-22 MED ORDER — FENTANYL CITRATE (PF) 100 MCG/2ML IJ SOLN: 100.0000 ug | Freq: Once | INTRAMUSCULAR | Status: DC | PRN

## 2018-03-22 NOTE — Progress Notes (Signed)
eLink Physician-Brief Progress Note Patient Name: Todd Bell DOB: 08/09/1946 MRN: [id number]   Date of Service  03/22/2018  HPI/Events of Note  Worsening hypoxemia  eICU Interventions  Cancel fluid bolus cxr stat Resident Dr Charlynn Grimes to bedside     Intervention Category Major Interventions: Respiratory failure - evaluation and management  Brand Males 03/22/2018, 1:36 AM

## 2018-03-22 NOTE — Progress Notes (Signed)
Initial Nutrition Assessment  DOCUMENTATION CODES:   Non-severe (moderate) malnutrition in context of chronic illness  INTERVENTION:   - Start Vital AF 1.2 @ 25 ml/hr and increase by 10 ml/hr every 6 hours to goal rate of 65 ml/hr (1560 ml/day) - Free water flushes per MD  Tube feeding regimen provides 1872 kcal (95% estimated energy needs), 117 grams of protein (102% estimated protein needs), and 1264 ml of H2O.  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  GOAL:   Patient will meet greater than or equal to 90% of their needs  MONITOR:   Vent status, Labs, I & O's, Skin, TF tolerance, Weight trends  REASON FOR ASSESSMENT:   Ventilator, Consult Enteral/tube feeding initiation and management  ASSESSMENT:   72 year old male who presented to the ED via EMS. Pt was found unresponsive by his wife following a 5-day history of dyspnea. 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 Pt intubated in the [facility] ED for respiratory distress and transferred to [facility]. Admitted for cardiogenic shock in setting of ACS.  03/27/2018 - s/p cardiac cath showing clean coronaries but concerning for stress cardiomyopathy  Patient is currently intubated on ventilator support. Pt with OG tube side port projecting in proximal stomach. MV: 12.7 L/min Temp (24hrs), Avg:98.7 F (37.1 C), Min:97.7 F (36.5 C), Max:100.4 F (38 C)  Propofol: none  Spoke with pt's 2 daughters at bedside. [name] reports that pt had a good appetite and typically ate well PTA. [name] endorse weight loss that occurred as pt underwent radiation earlier this year. [name]  daughters state pt's UBW is 165-170 lbs. Pt drank Ensure oral nutrition supplements during radiation and increased calorie intake to prevent weight loss.  Per weight history in chart, pt has lost 14.7 lbs in the past 5 months. This is an 8.6% weight loss which is not significant for timeframe.  Medications reviewed and include: 20 mg IV Pepcid BID, IV antibiotics Drips: Nimbex @ 10.5 ml/hr Fentanyl @ 25 ml/hr Milrinone @ 2.7 ml/hr Levophed @ 3.8 ml/hr Midazolam @ 2 ml/hr  UOP: 1025 ml x 24 hours  Labs reviewed: BUN 59 (H), creatinine 1.33 (H), elevated AST and ALT, hemoglobin 11.2 (L), HCT 35 (L), troponin 10.42 (H)  NUTRITION - FOCUSED PHYSICAL EXAM:    Most Recent Value  Orbital Region  Mild depletion  Upper Arm Region  No depletion  Thoracic and Lumbar Region  Moderate depletion  Buccal Region  Unable to assess  Temple Region  Mild depletion  Clavicle Bone Region  Moderate depletion  Clavicle and Acromion Bone Region  Moderate depletion  Scapular Bone Region  Unable to assess  Dorsal Hand  No depletion  Patellar Region  Moderate depletion  Anterior Thigh Region  Mild depletion  Posterior Calf Region  Mild depletion  Edema (RD Assessment)  None  Hair  Reviewed  Eyes  Unable to assess  Mouth  Unable to assess  Skin  Reviewed  Nails  Reviewed       Diet Order:  Diet NPO time specified  EDUCATION NEEDS:   Not appropriate for education at this time  Skin:  Skin Assessment: Skin Integrity Issues: Skin Integrity Issues:: Other (Comment) Other: pressure ulcer of unknown stage  to sacrum  Last BM:  unknown/PTA  Height:   Ht Readings from Last 1 Encounters:  03/12/2018 6' (1.829 m)    Weight:   Wt Readings from Last 1 Encounters:  03/22/18 157 lb 3 oz (71.3 kg)    Ideal Body Weight:  80.9 kg  BMI:  Body mass index is 21.32 kg/m.  Estimated Nutritional Needs:   Kcal:  1964 kcal/day (PSU 2003b)  Protein:  100-115 grams/day (1.4-1.6 g/kg)  Fluid:  >/=  1.8 L/day    Todd Face, MS, RD, LDN Pager: [phone] Weekend/After Hours: [phone]

## 2018-03-22 NOTE — Significant Event (Addendum)
.. ..    Name: Todd Bell MRN: [id number] DOB: Apr 20, 1946    ADMISSION DATE:  03/20/2018   Pt s/p cardiac cath - showed clean coronaries but concerning for stress cardiomyopathy Called by Elink to bedside due to Refractory Hypoxemia despite PCV PEEP 12  On my evaluation at 2 AM pt is breathing >35 bpm and increased WOB despite trying different modes of ventilation he continues to be dyssynchronous. Co ox 40 Wedge 13  Bedside ECHO: IVC >2cm not varying with respiration - subcostal, long parasternal and apical views LVEF 40% or less no signs of pericardial effusion. No signs of LVOT Discussed case and cath findings with overnight Cardio fellow  Starting sedation with versed (intermittent) and continuous fentanyl Adding paralytic- Nimbex If pt continues to desaturate despite adequate sedation and paralytics will consider nitric @20  ppm. Currently not hypotensive if MAP <66mmHg will start Levophed ggt   Signed Dr Seward Carol Pulmonary Critical Care Locums   03/22/2018, 2:16 AM

## 2018-03-22 NOTE — Progress Notes (Signed)
eLink Physician-Brief Progress Note Patient Name: Todd Bell DOB: [birth year] MRN: [id number]   Date of Service  03/22/2018  HPI/Events of Note  Sudden increase levo requirement Fever CI unchanged, PAd low  eICU Interventions  Volume challenge -1L ABG stat, drop FIO2 per satn     Intervention Category Major Interventions: Hypotension - evaluation and management  Todd Bell 03/22/2018, 7:52 PM

## 2018-03-22 NOTE — Progress Notes (Signed)
Progress Note  Patient Name: Todd Bell Date of Encounter: 03/22/2018  Primary Cardiologist: No primary care provider on file.   Subjective   Unable to obtain.    Inpatient Medications    Scheduled Meds: . artificial tears  1 application Both Eyes Y6A  . chlorhexidine gluconate (MEDLINE KIT)  15 mL Mouth Rinse BID  . Chlorhexidine Gluconate Cloth  6 each Topical Daily  . fentaNYL (SUBLIMAZE) injection  50 mcg Intravenous Once  . heparin  5,000 Units Subcutaneous Q8H  . mouth rinse  15 mL Mouth Rinse 10 times per day  . sodium chloride flush  10-40 mL Intracatheter Q12H  . sodium chloride flush  3 mL Intravenous Q12H   Continuous Infusions: . sodium chloride    . sodium chloride    . cisatracurium (NIMBEX) infusion 3 mcg/kg/min (03/22/18 0239)  . famotidine (PEPCID) IV    . fentaNYL infusion INTRAVENOUS 250 mcg/hr (03/22/18 0250)  . midazolam (VERSED) infusion 2 mg/hr (03/22/18 0252)  . norepinephrine (LEVOPHED) Adult infusion Stopped (03/22/18 0320)  . piperacillin-tazobactam (ZOSYN)  IV 3.375 g (03/22/18 0444)  .  sodium bicarbonate  infusion 1000 mL 75 mL/hr at 03/22/18 0450  . vancomycin Stopped (03/22/18 0217)   PRN Meds: sodium chloride, sodium chloride, bisacodyl, fentaNYL, fentaNYL (SUBLIMAZE) injection, midazolam, midazolam, sennosides, sodium chloride flush, sodium chloride flush   Vital Signs    Vitals:   03/22/18 0500 03/22/18 0600 03/22/18 0700 03/22/18 0800  BP: (!) 78/69 (!) 72/61  (!) 69/55  Pulse: (!) 112 (!) 113 (!) 116 (!) 115  Resp: (!) 25 (!) 25 (!) 25 (!) 25  Temp: 98.8 F (37.1 C) 99.1 F (37.3 C) 99.5 F (37.5 C)   TempSrc:      SpO2: 100% 97% 94% 94%  Weight:      Height:        Intake/Output Summary (Last 24 hours) at 03/22/2018 0813 Last data filed at 03/22/2018 0700 Gross per 24 hour  Intake 1748.46 ml  Output 1025 ml  Net 723.46 ml   Filed Weights   04/01/2018 1300 03/13/2018 1815 03/22/18 0452  Weight: 158 lb 4.6 oz (71.8  kg) 157 lb 3 oz (71.3 kg) 157 lb 3 oz (71.3 kg)    Telemetry    Sinus tachycardia.  No events.  - Personally Reviewed  ECG    Sinus tachycardia.  Rate 122 bpm.  Anterior ST depression. - Personally Reviewed  Physical Exam   VS:  BP (!) 72/62   Pulse (!) 114   Temp 99.9 F (37.7 C)   Resp (!) 25   Ht 6' (1.829 m)   Wt 157 lb 3 oz (71.3 kg)   SpO2 95%   BMI 21.32 kg/m  , BMI Body mass index is 21.32 kg/m. GENERAL:  Critically ill-appearing.  Intubated.  HEENT: Pupils equal round and reactive, fundi not visualized, oral mucosa unremarkable NECK:  No jugular venous distention, waveform within normal limits, carotid upstroke brisk and symmetric, no bruits LUNGS:  Clear to auscultation bilaterally on anterior exam HEART:  Tachycardic.  Regular rhythm.  PMI not displaced or sustained,S1 and S2 within normal limits, no S3, no S4, no clicks, no rubs, no murmurs ABD:  Flat, positive bowel sounds normal in frequency in pitch, no bruits, no rebound, no guarding, no midline pulsatile mass, no hepatomegaly, no splenomegaly EXT:  2 plus pulses throughout. LE cool.  No edema, no cyanosis no clubbing SKIN:  No rashes no nodules NEURO:  Unable to assess  PSYCH:  Unable to assess.    Labs    Chemistry Recent Labs  Lab 04/05/2018 1050 03/08/2018 1822 04/05/2018 2258  NA 144 144  --   K 5.6* 5.0  --   CL 111 112*  --   CO2 18* 20*  --   GLUCOSE 130* 169*  --   BUN 54* 59*  --   CREATININE 1.64* 1.33* 1.22  CALCIUM 7.6* 7.8*  --   PROT 6.3* 6.8  --   ALBUMIN 2.5* 2.8*  --   AST 617* 900*  --   ALT 537* 599*  --   ALKPHOS 83 90  --   BILITOT 1.8* 0.7  --   GFRNONAA 40* 51* 57*  GFRAA 46* 59* >60  ANIONGAP 15 12  --      Hematology Recent Labs  Lab 04/02/2018 1050 03/09/2018 1822 03/19/2018 2258  WBC 6.7 8.3 7.5  RBC 3.41* 3.54* 3.33*  HGB 10.8* 11.2* 10.3*  HCT 33.2* 35.0* 32.7*  MCV 97.4 98.9 98.2  MCH 31.7 31.6 30.9  MCHC 32.5 32.0 31.5  RDW 16.4* 16.4* 16.6*  PLT 88* 82*  70*    Cardiac Enzymes Recent Labs  Lab 03/19/2018 1050 03/15/2018 1822  TROPONINI 5.23* 10.42*   No results for input(s): TROPIPOC in the last 168 hours.   BNP Recent Labs  Lab 03/31/2018 1050  BNP 621.7*     DDimer  Recent Labs  Lab 04/03/2018 1822  DDIMER >20.00*     Radiology    Ct Chest Wo Contrast  Result Date: 03/10/2018 CLINICAL DATA:  Respiratory distress EXAM: CT CHEST WITHOUT CONTRAST TECHNIQUE: Multidetector CT imaging of the chest was performed following the standard protocol without IV contrast. COMPARISON:  Chest radiograph dated 03/17/2018. PET-CT dated 12/24/2017. CT chest dated 10/23/2017. FINDINGS: Cardiovascular: The heart is normal in size. No pericardial effusion. No evidence of thoracic aortic aneurysm. Atherosclerotic calcifications of the aortic arch. Coronary atherosclerosis the LAD and left circumflex. Left chest port terminates in the lower SVC. Mediastinum/Nodes: No suspicious mediastinal lymphadenopathy. Visualized thyroid is unremarkable. Lungs/Pleura: Endotracheal tube terminates 3.8 cm above the carina. Ground-glass opacity in the bilateral upper and lower lobes in a perihilar distribution with coarsened interstitial markings. Associated traction bronchiectasis. This appearance favors chronic interstitial lung disease which is progressive. The appearance does not favor a UIP pattern/idiopathic pulmonary fibrosis but may reflect NSIP. Calcified pleural plaques in the anterior right hemithorax. No pleural effusion or pneumothorax. Upper Abdomen: Enteric tube courses into the proximal stomach. Musculoskeletal: Mild degenerative changes of the visualized thoracolumbar spine. IMPRESSION: Endotracheal tube terminates 3.8 cm above the carina. Scattered ground-glass opacity with coarse interstitial markings and bronchiectasis, favoring progressive chronic interstitial lung disease. Aortic Atherosclerosis (ICD10-I70.0). Electronically Signed   By: Julian Hy M.D.    On: 03/28/2018 16:59   Dg Chest Port 1 View  Result Date: 03/22/2018 CLINICAL DATA:  Acute respiratory failure. EXAM: PORTABLE CHEST 1 VIEW COMPARISON:  Chest radiograph March 21, 2018 and CT chest March 21, 2018 FINDINGS: Endotracheal tube tip projects 4.5 cm above the carina. Nasogastric tube side port projecting in proximal stomach, tip out of field-of-view. Single lumen LEFT chest Port-A-Cath distal tip in mid superior vena cava. The cardiac silhouette is mildly enlarged. Similar fullness of the hila corresponding to vascular structures on recent CT. Mildly calcified aortic arch. Stable faint nodular density LEFT upper lung zone without discrete nodule on yesterday's CT. Similar interstitial prominence without pleural effusion or focal consolidation. No pneumothorax. Soft tissue planes  and included osseous structures are unchanged. IMPRESSION: No apparent change in life-support lines. Stable interstitial changes.  Stable cardiomegaly. Aortic Atherosclerosis (ICD10-I70.0). Electronically Signed   By: Elon Alas M.D.   On: 03/22/2018 01:51   Dg Chest Portable 1 View  Result Date: 03/23/2018 CLINICAL DATA:  Hypoxia EXAM: PORTABLE CHEST 1 VIEW COMPARISON:  Chest radiograph and chest CT obtained earlier in the day FINDINGS: Endotracheal tube tip is 4.0 cm above the carina. Nasogastric tube tip and side port are below the diaphragm with side port seen in the stomach. Port-A-Cath tip in superior vena cava. No pneumothorax. There is hazy opacity in both mid lungs. Heart is mildly enlarged with pulmonary vascularity within normal limits. No adenopathy. No bone lesions. IMPRESSION: Hazy opacity in both mid lungs, similar to earlier in the day. Suspect a degree of underlying pneumonitis. Stable cardiac prominence. Tube and catheter positions as described without pneumothorax. Electronically Signed   By: Lowella Grip III M.D.   On: 04/03/2018 16:49   Dg Chest Portable 1 View  Result Date:  03/08/2018 CLINICAL DATA:  ETT and OG tube placement. EXAM: PORTABLE CHEST 1 VIEW COMPARISON:  Chest x-ray from same day at 10:45 a.m. FINDINGS: Interval placement of an endotracheal tube with the tip approximately 4.2 cm above the level of the carina. Enteric tube in place with the distal side port near the gastroesophageal junction. Unchanged left chest wall port catheter with the tip in the distal SVC. Stable cardiomegaly. Unchanged perihilar and left mid lung interstitial thickening. No pleural effusion or pneumothorax. No acute osseous abnormality. IMPRESSION: 1. Appropriately positioned endotracheal tube. 2. Enteric tube in place with the distal side port near the gastroesophageal junction. Recommend advancement 2-3 cm. 3. Unchanged perihilar and left mid lung interstitial opacities which may reflect edema or interstitial pneumonitis related to infectious or inflammatory process. Electronically Signed   By: Titus Dubin M.D.   On: 03/29/2018 13:30   Dg Chest Port 1 View  Result Date: 03/26/2018 CLINICAL DATA:  Found unresponsive at home. History of cough. History of non-Hodgkin's lymphoma. EXAM: PORTABLE CHEST 1 VIEW COMPARISON:  Chest radiograph 10/05/2017 FINDINGS: Patient has a left jugular Port-A-Cath and the tip is in a stable position within the SVC. Mildly decreased lung volumes with increased interstitial densities in both lungs. Interstitial densities are most prominent in the left mid lung region. Cardiomediastinal silhouette is prominent but may be accentuated by AP technique. IMPRESSION: Interstitial densities in both lungs most prominent in the left lung. Findings are suggestive for interstitial edema. Cannot exclude an infectious or inflammatory process, particularly in the left lung. Question mild cardiomegaly. Electronically Signed   By: Markus Daft M.D.   On: 03/19/2018 11:08    Cardiac Studies   LHC/RHC 03/20/2018:  Angiographically normal coronary arteries with no notable  disease.  There is moderate to severe left ventricular systolic dysfunction. EF estimated at roughly 25-35%. -In a Takotsubo pattern  LV end diastolic pressure is normal.  Relatively normal right heart cath pressures.   ASSESSMENT:  Angiographically normal coronary arteries with no obstructive disease to explain elevated troponins.  Grossly abnormal Left Ventriculogram Suggesting Takotsubo Cardiomyopathy  Mildly reduced Cardiac Output/Index with Essentially Normal Right Heart Pressures.  Arterial and venous blood gases have been uploaded to the epic system  RA 8 PA 35/6, 25 RV 37/3, 11 PCWP 11  Patient Profile     72 y.o. male with B cell lymphoma with partial response to R CHOP found unresponsive, hypotensive and hypoxic at  home on 03/12/2018.  Assessment & Plan    # Takotsubo cardiomyopathy:  # Cardiogenic shock: Normal coronary arteries on LHC and diffuse mid-apical hypokinesis.  Echocardiogram pending.  Family denies preceding productive cough, fever or chills.  He hasn't complained of HF symptoms prior to admission.  No new stressors though family thinks he may have been stressed about upcoming cancer imaging.  Co-ox was 40.8 at 2am, indicating cardiogenic shock. He is persistently tachycardic.  BP very low, though MAP >65. He is cold and dry on exam.  Will repeat co-ox.  Likely switch to milrinone.  D-dimer is >20.  Given his normal pulmonary pressure PE seems less likely, though he does have active malignancy.   # B cell lymphoma: Partially responsive to R CHOP.  Was awaiting staging PET CT.  Completed chemotherapy 01/2018.   # Hypoxic respiratory failure:  # Respiratory acidosis:  Improving on vent.  CCM has struggled with dyssynchrony on vent but he is doing better now.  Family (wife, 2 daughers) was updated at the bedside.    Time spent: 45 minutes-Greater than 50% of this time was spent in counseling, explanation of diagnosis, planning of further management, and  coordination of care.   For questions or updates, please contact [place] Please consult [email or web address] for contact info under Cardiology/STEMI.      Signed, Skeet Latch, MD  03/22/2018, 8:13 AM

## 2018-03-22 NOTE — Progress Notes (Signed)
eLink Physician-Brief Progress Note Patient Name: Todd Bell DOB: Oct 21, 1946 MRN: [id number]   Date of Service  03/22/2018  HPI/Events of Note  BP improved with bolus ABg - improved pO2 & acidosis  eICU Interventions  Dc bicarb gtt Drop FIO2/ PEEP - can dc nimbex eventual     Intervention Category Major Interventions: Acid-Base disturbance - evaluation and management  Rakesh V. Alva 03/22/2018, 8:47 PM

## 2018-03-22 NOTE — Progress Notes (Signed)
[facility] Progress Note Patient Name: Doroteo Nickolson DOB: 04/07/1946 MRN: [id number]   Date of Service  03/22/2018  HPI/Events of Note  HR 113, MAP 77 on levophed. CVP 12  eICU Interventions  Wean levophed 250cc saline bolus     Intervention Category Major Interventions: Shock - evaluation and management  Kyna Blahnik 03/22/2018, 1:10 AM

## 2018-03-22 NOTE — Progress Notes (Signed)
eLink Physician-Brief Progress Note Patient Name: Todd Bell DOB: Sep 01, 1946 MRN: [id number] Latest aBG  7.18/x/100+  Plan Start bic gtt Recheck abg    Intervention Category Major Interventions: Acid-Base disturbance - evaluation and management  Brand Males 03/22/2018, 4:30 AM

## 2018-03-22 NOTE — Progress Notes (Signed)
PHARMACY - PHYSICIAN COMMUNICATION CRITICAL VALUE ALERT - BLOOD CULTURE IDENTIFICATION (BCID)  Todd Bell is an 72 y.o. male who presented to [facility] on 03/29/2018 with a chief complaint of shock  Assessment:  Admitted with shock and respiratory failure. He is on empiric antibiotic coverage with Vancomycin and Zosyn.  He has 1 blood culture set (out of 3 sets total) that is growing Gram positive cocci. Methicillin resistant coagulate negative staph was detected on BCID.  His other blood cultures remain negative per the microbiology technician.  He is covered with his current antibiotics for this organism while we wait for his other cultures to determine if this is a contaminant or not.  Name of physician (or Provider) Contacted: 2H pharmacist notified Dr Nelda Marseille on rounds  Current antibiotics: Vanc and Zosyn  Changes to prescribed antibiotics recommended:  Patient is on recommended antibiotics - No changes needed  Results for orders placed or performed during the hospital encounter of 03/20/2018  Blood Culture ID Panel (Reflexed) (Collected: 03/15/2018 10:50 AM)  Result Value Ref Range   Enterococcus species NOT DETECTED NOT DETECTED   Listeria monocytogenes NOT DETECTED NOT DETECTED   Staphylococcus species DETECTED (A) NOT DETECTED   Staphylococcus aureus NOT DETECTED NOT DETECTED   Methicillin resistance DETECTED (A) NOT DETECTED   Streptococcus species NOT DETECTED NOT DETECTED   Streptococcus agalactiae NOT DETECTED NOT DETECTED   Streptococcus pneumoniae NOT DETECTED NOT DETECTED   Streptococcus pyogenes NOT DETECTED NOT DETECTED   Acinetobacter baumannii NOT DETECTED NOT DETECTED   Enterobacteriaceae species NOT DETECTED NOT DETECTED   Enterobacter cloacae complex NOT DETECTED NOT DETECTED   Escherichia coli NOT DETECTED NOT DETECTED   Klebsiella oxytoca NOT DETECTED NOT DETECTED   Klebsiella pneumoniae NOT DETECTED NOT DETECTED   Proteus species NOT DETECTED NOT DETECTED    Serratia marcescens NOT DETECTED NOT DETECTED   Haemophilus influenzae NOT DETECTED NOT DETECTED   Neisseria meningitidis NOT DETECTED NOT DETECTED   Pseudomonas aeruginosa NOT DETECTED NOT DETECTED   Candida albicans NOT DETECTED NOT DETECTED   Candida glabrata NOT DETECTED NOT DETECTED   Candida krusei NOT DETECTED NOT DETECTED   Candida parapsilosis NOT DETECTED NOT DETECTED   Candida tropicalis NOT DETECTED NOT DETECTED   Legrand Como, Pharm.D., BCPS, BCIDP Clinical Pharmacist Phone: [phone] or [phone] 03/22/2018, 2:25 PM

## 2018-03-22 NOTE — Progress Notes (Signed)
Chaplain on 2H Unit while assisting another family and was informed of the patient's medical status was of major concern. Chaplain presented, provided the ministry of presence and encouragement,with the wife.  Will follow up as needed for support. Chaplain Yaakov Guthrie [phone]

## 2018-03-22 NOTE — Progress Notes (Signed)
PULMONARY / CRITICAL CARE MEDICINE   Name: Todd Bell MRN: [id number] DOB: 09-05-1946    ADMISSION DATE:  03/31/2018 CONSULTATION DATE: 03/13/2018  REFERRING MD: [facility] to [facility]  CHIEF COMPLAINT: Shock and respiratory failure.  HISTORY OF PRESENT ILLNESS:   Was not able to obtain a history from the patient as he was intubated.  Information is conveyed by the emergency physician and the patient's spouse.  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  He is followed by Dr. Lebron Conners and is scheduled for a PET scan in the near future.  His wife reports a 5-day history of dyspnea without chest pain possibly associated with orthopnea.  Yesterday he appeared diaphoretic and clammy.    He was found unresponsive this morning.  Saturations were in the 7s and blood pressure was initially normal.  He was intubated and treatment was started for a presumptive diagnosis of community-acquired pneumonia.  Upon returning from a CT scan of the chest he became increasingly hypotensive and hypoxic.  I initiated norepinephrine in the ED and increased his PEEP.  He was paralyzed to improve ventilator synchrony.  He was transported to the ICU  SUBJECTIVE:  Refractory hypoxemia overnight  VITAL SIGNS: BP (!) 69/55   Pulse (!) 115   Temp 99.5 F (37.5 C)   Resp (!) 25   Ht 6' (1.829 m)   Wt 157 lb 3 oz (71.3 kg)   SpO2 94%   BMI 21.32 kg/m   HEMODYNAMICS: PAP: (35-44)/(16-26) 36/19 CVP:  [9 mmHg-12 mmHg] 12 mmHg PCWP:  [13 mmHg-16 mmHg] 16 mmHg CO:  [2.7 L/min-3.2 L/min] 2.7 L/min CI:  [1.4 L/min/m2-1.7 L/min/m2] 1.4 L/min/m2On norepinephrine 12 mcg/min  VENTILATOR SETTINGS: Vent Mode: PCV FiO2 (%):  [100 %] 100 % Set Rate:  [22 bmp-25 bmp] 25 bmp Vt Set:  [500 mL-620 mL] 500 mL PEEP:  [5 cmH20-12 cmH20] 8 cmH20 Plateau  Pressure:  [16 cmH20-34 cmH20] 23 cmH20  INTAKE / OUTPUT: I/O last 3 completed shifts: In: 1748.5 [I.V.:1448.5; IV Piggyback:300] Out: 1025 [Urine:1025]  PHYSICAL EXAMINATION: General: Elderly gentle man, laying in bed in NAD Neuro: Sedated and intubated, withdraws all ext to pain, NAD HEENT: [place]/AT, PERRL, EOM-I and MMM Cardiovascular: tachy, regular, Nl S1/S2 and -M/R/G Lungs: Coarse BS diffusely Abdomen: Soft, NT, ND and +BS Musculoskeletal: -edema and -tenderness Skin: Intact  LABS:  BMET Recent Labs  Lab 03/29/2018 1050 03/30/2018 1822 04/01/2018 2258  NA 144 144  --   K 5.6* 5.0  --   CL 111 112*  --   CO2 18* 20*  --   BUN 54* 59*  --   CREATININE 1.64* 1.33* 1.22  GLUCOSE 130* 169*  --    Electrolytes Recent Labs  Lab 03/20/2018 1050 03/26/2018 1822  CALCIUM 7.6* 7.8*   CBC Recent Labs  Lab 03/20/2018 1050 04/02/2018 1822 03/26/2018 2258  WBC 6.7 8.3 7.5  HGB 10.8* 11.2* 10.3*  HCT 33.2* 35.0* 32.7*  PLT 88* 82* 70*   Coag's Recent Labs  Lab 03/10/2018 1822  APTT 33  INR 1.43   Sepsis Markers Recent Labs  Lab 03/28/2018 1053 03/26/2018 1356 03/15/2018 1822 04/04/2018 1904  LATICACIDVEN 4.82* 3.24*  --  1.9  PROCALCITON  --   --  2.12  --    ABG Recent Labs  Lab 03/22/18 0132 03/22/18  0425 03/22/18 0529  PHART 7.350 7.186* 7.291*  PCO2ART 35.7 63.8* 43.9  PO2ART 42.0* 102.0 97.0   Liver Enzymes Recent Labs  Lab 04/02/2018 1050 03/17/2018 1822  AST 617* 900*  ALT 537* 599*  ALKPHOS 83 90  BILITOT 1.8* 0.7  ALBUMIN 2.5* 2.8*   Cardiac Enzymes Recent Labs  Lab 03/08/2018 1050 03/20/2018 1822  TROPONINI 5.23* 10.42*   Glucose No results for input(s): GLUCAP in the last 168 hours.  Imaging Ct Chest Wo Contrast  Result Date: 03/10/2018 CLINICAL DATA:  Respiratory distress EXAM: CT CHEST WITHOUT CONTRAST TECHNIQUE: Multidetector CT imaging of the chest was performed following the standard protocol without IV contrast. COMPARISON:  Chest radiograph  dated 03/23/2018. PET-CT dated 12/24/2017. CT chest dated 10/23/2017. FINDINGS: Cardiovascular: The heart is normal in size. No pericardial effusion. No evidence of thoracic aortic aneurysm. Atherosclerotic calcifications of the aortic arch. Coronary atherosclerosis the LAD and left circumflex. Left chest port terminates in the lower SVC. Mediastinum/Nodes: No suspicious mediastinal lymphadenopathy. Visualized thyroid is unremarkable. Lungs/Pleura: Endotracheal tube terminates 3.8 cm above the carina. Ground-glass opacity in the bilateral upper and lower lobes in a perihilar distribution with coarsened interstitial markings. Associated traction bronchiectasis. This appearance favors chronic interstitial lung disease which is progressive. The appearance does not favor a UIP pattern/idiopathic pulmonary fibrosis but may reflect NSIP. Calcified pleural plaques in the anterior right hemithorax. No pleural effusion or pneumothorax. Upper Abdomen: Enteric tube courses into the proximal stomach. Musculoskeletal: Mild degenerative changes of the visualized thoracolumbar spine. IMPRESSION: Endotracheal tube terminates 3.8 cm above the carina. Scattered ground-glass opacity with coarse interstitial markings and bronchiectasis, favoring progressive chronic interstitial lung disease. Aortic Atherosclerosis (ICD10-I70.0). Electronically Signed   By: Julian Hy M.D.   On: 03/30/2018 16:59   Dg Chest Port 1 View  Result Date: 03/22/2018 CLINICAL DATA:  Acute respiratory failure. EXAM: PORTABLE CHEST 1 VIEW COMPARISON:  Chest radiograph March 21, 2018 and CT chest March 21, 2018 FINDINGS: Endotracheal tube tip projects 4.5 cm above the carina. Nasogastric tube side port projecting in proximal stomach, tip out of field-of-view. Single lumen LEFT chest Port-A-Cath distal tip in mid superior vena cava. The cardiac silhouette is mildly enlarged. Similar fullness of the hila corresponding to vascular structures on recent CT.  Mildly calcified aortic arch. Stable faint nodular density LEFT upper lung zone without discrete nodule on yesterday's CT. Similar interstitial prominence without pleural effusion or focal consolidation. No pneumothorax. Soft tissue planes and included osseous structures are unchanged. IMPRESSION: No apparent change in life-support lines. Stable interstitial changes.  Stable cardiomegaly. Aortic Atherosclerosis (ICD10-I70.0). Electronically Signed   By: Elon Alas M.D.   On: 03/22/2018 01:51   Dg Chest Portable 1 View  Result Date: 03/13/2018 CLINICAL DATA:  Hypoxia EXAM: PORTABLE CHEST 1 VIEW COMPARISON:  Chest radiograph and chest CT obtained earlier in the day FINDINGS: Endotracheal tube tip is 4.0 cm above the carina. Nasogastric tube tip and side port are below the diaphragm with side port seen in the stomach. Port-A-Cath tip in superior vena cava. No pneumothorax. There is hazy opacity in both mid lungs. Heart is mildly enlarged with pulmonary vascularity within normal limits. No adenopathy. No bone lesions. IMPRESSION: Hazy opacity in both mid lungs, similar to earlier in the day. Suspect a degree of underlying pneumonitis. Stable cardiac prominence. Tube and catheter positions as described without pneumothorax. Electronically Signed   By: Lowella Grip III M.D.   On: 03/26/2018 16:49   Dg Chest Portable 1 View  Result Date: 03/20/2018 CLINICAL DATA:  ETT and OG tube placement. EXAM: PORTABLE CHEST 1 VIEW COMPARISON:  Chest x-ray from same day at 10:45 a.m. FINDINGS: Interval placement of an endotracheal tube with the tip approximately 4.2 cm above the level of the carina. Enteric tube in place with the distal side port near the gastroesophageal junction. Unchanged left chest wall port catheter with the tip in the distal SVC. Stable cardiomegaly. Unchanged perihilar and left mid lung interstitial thickening. No pleural effusion or pneumothorax. No acute osseous abnormality. IMPRESSION: 1.  Appropriately positioned endotracheal tube. 2. Enteric tube in place with the distal side port near the gastroesophageal junction. Recommend advancement 2-3 cm. 3. Unchanged perihilar and left mid lung interstitial opacities which may reflect edema or interstitial pneumonitis related to infectious or inflammatory process. Electronically Signed   By: Titus Dubin M.D.   On: 03/31/2018 13:30   Dg Chest Port 1 View  Result Date: 03/31/2018 CLINICAL DATA:  Found unresponsive at home. History of cough. History of non-Hodgkin's lymphoma. EXAM: PORTABLE CHEST 1 VIEW COMPARISON:  Chest radiograph 10/05/2017 FINDINGS: Patient has a left jugular Port-A-Cath and the tip is in a stable position within the SVC. Mildly decreased lung volumes with increased interstitial densities in both lungs. Interstitial densities are most prominent in the left mid lung region. Cardiomediastinal silhouette is prominent but may be accentuated by AP technique. IMPRESSION: Interstitial densities in both lungs most prominent in the left lung. Findings are suggestive for interstitial edema. Cannot exclude an infectious or inflammatory process, particularly in the left lung. Question mild cardiomegaly. Electronically Signed   By: Markus Daft M.D.   On: 04/03/2018 11:08     STUDIES:  Serial EKGs show dynamic anterior T wave inversions most prominently in leads V1 through V4.  Some normalization has occurred with improved blood pressure.  CULTURES: Blood cultures are pending  ANTIBIOTICS: Currently on vancomycin and Zosyn   LINES/TUBES: Foley catheter, 7.5 mm endotracheal tube, right radial arterial line, pressors infusing through left subclavian Port-A-Cath.  DISCUSSION: Severe shock initially thought to be due to to sepsis from pneumonia.  However, sepsis screen score is low and chest imaging more consistent with interstitial edema.  Furthermore, patient has a significantly elevated troponin, new and dynamic anterior T wave  inversions and a focused echocardiogram showing septal akinesis consistent with infarction in the area seen on the EKGs.  This case has been discussed with Dr. Ellyn Hack in interventional cardiology at [facility] who concurs that the patient would best be served in an intervention capable cardiac center.  We will be arranging [facility] transportation.  ASSESSMENT / PLAN:  PULMONARY A: Acute hypoxic respiratory failure due to acute pulmonary edema. P:   - Lung protective low tidal volume ventilation. - PEEP optimized to improve oxygenation, will increase PEEP to 14 and monitor hemodynamics - Sedation /-neuromuscular blockade to maintain ventilator synchrony.  CARDIOVASCULAR A:  Acute cardiogenic shock, initially cold and wet hemodynamic profile.  Perfusion has improved with initiation of norepinephrine.  Differential diagnosis includes recent anterior acute coronary syndrome or chemotherapy-induced cardiomyopathy. P:  - We will treat as ACS at present: Given ASA and started on heparin infusion. - Cards following, will defer to cards regarding stress induced cardiomyopathy - Levophed for BP support  RENAL A:   Evolving acute kidney injury initially.  Urine output has improved with restoration of normal blood pressure. P:   - Continue to monitor urine output. - BMET in now and in AM - Replace electrolytes as  indicated - Bicarb drip for 24 more hours - Begin TF  GASTROINTESTINAL A:   No active abdominal process.  Appears adequately nourished. LFTs elevated, ?shocked liver P:   - TF per nutrition - LFT and coags in AM  HEMATOLOGIC A:   Anemia of acute illness as well as hematologic malignancy.  New thrombocytopenia, possibly due to acute marrow suppression from acute illness. P:  - CBC in AM - Transfuse per ICU protocol  INFECTIOUS A:   Shock state which on further examination does not appear to be infectious in origin. P:   - Continue antibiotics until clinical situation  is clarified. - Procal 2.12, will continue abx for now  ENDOCRINE A:   Acceptable  glycemic control P:   - Initiate insulin therapy to maintain euglycemia.  NEUROLOGIC A:   Neurologically normal at baseline.  Currently sedated to ensure comfort and ventilator compliance. P:   RASS goal: -4 - Versed and fentanyl drip - Nimbex while having such difficulty oxygenating  FAMILY  - Updates: Wife updated at length bedside  The patient is critically ill with multiple organ systems failure and requires high complexity decision making for assessment and support, frequent evaluation and titration of therapies, application of advanced monitoring technologies and extensive interpretation of multiple databases.   Critical Care Time devoted to patient care services described in this note is  35  Minutes. This time reflects time of care of this signee Dr Jennet Maduro. This critical care time does not reflect procedure time, or teaching time or supervisory time of PA/NP/Med student/Med Resident etc but could involve care discussion time.  Rush Farmer, M.D. [facility] Pulmonary/Critical Care Medicine. Pager: [phone]. After hours pager: [phone].  03/22/2018, 8:44 AM

## 2018-03-22 NOTE — Progress Notes (Signed)
  Echocardiogram 2D Echocardiogram has been performed.  Jennette Dubin 03/22/2018, 3:46 PM

## 2018-03-23 ENCOUNTER — Inpatient Hospital Stay (HOSPITAL_COMMUNITY): Payer: Medicare Other

## 2018-03-23 DIAGNOSIS — J80 Acute respiratory distress syndrome: Secondary | ICD-10-CM

## 2018-03-23 DIAGNOSIS — D696 Thrombocytopenia, unspecified: Secondary | ICD-10-CM

## 2018-03-23 DIAGNOSIS — E440 Moderate protein-calorie malnutrition: Secondary | ICD-10-CM

## 2018-03-23 LAB — HEPATIC FUNCTION PANEL
ALK PHOS: 79 U/L (ref 38–126)
ALT: 462 U/L — AB (ref 17–63)
AST: 215 U/L — AB (ref 15–41)
Albumin: 2 g/dL — ABNORMAL LOW (ref 3.5–5.0)
BILIRUBIN DIRECT: 0.4 mg/dL (ref 0.1–0.5)
BILIRUBIN TOTAL: 0.9 mg/dL (ref 0.3–1.2)
Indirect Bilirubin: 0.5 mg/dL (ref 0.3–0.9)
Total Protein: 5 g/dL — ABNORMAL LOW (ref 6.5–8.1)

## 2018-03-23 LAB — CBC
HEMATOCRIT: 28.7 % — AB (ref 39.0–52.0)
HEMOGLOBIN: 8.9 g/dL — AB (ref 13.0–17.0)
MCH: 30.8 pg (ref 26.0–34.0)
MCHC: 31 g/dL (ref 30.0–36.0)
MCV: 99.3 fL (ref 78.0–100.0)
PLATELETS: 46 10*3/uL — AB (ref 150–400)
RBC: 2.89 MIL/uL — AB (ref 4.22–5.81)
RDW: 16.2 % — ABNORMAL HIGH (ref 11.5–15.5)
WBC: 6.3 10*3/uL (ref 4.0–10.5)

## 2018-03-23 LAB — POCT I-STAT 3, ART BLOOD GAS (G3+)
Acid-Base Excess: 2 mmol/L (ref 0.0–2.0)
Bicarbonate: 27.3 mmol/L (ref 20.0–28.0)
O2 SAT: 91 %
TCO2: 29 mmol/L (ref 22–32)
pCO2 arterial: 50.9 mmHg — ABNORMAL HIGH (ref 32.0–48.0)
pH, Arterial: 7.344 — ABNORMAL LOW (ref 7.350–7.450)
pO2, Arterial: 72 mmHg — ABNORMAL LOW (ref 83.0–108.0)

## 2018-03-23 LAB — BLOOD GAS, ARTERIAL
Acid-Base Excess: 1.9 mmol/L (ref 0.0–2.0)
Bicarbonate: 26.1 mmol/L (ref 20.0–28.0)
Drawn by: 347621
FIO2: 80
LHR: 25 {breaths}/min
O2 Saturation: 99.8 %
PEEP: 12 cmH2O
PRESSURE CONTROL: 18 cmH2O
pCO2 arterial: 42.3 mmHg (ref 32.0–48.0)
pH, Arterial: 7.407 (ref 7.350–7.450)
pO2, Arterial: 234 mmHg — ABNORMAL HIGH (ref 83.0–108.0)

## 2018-03-23 LAB — DIC (DISSEMINATED INTRAVASCULAR COAGULATION) PANEL
APTT: 41 s — AB (ref 24–36)
INR: 1.4
PROTHROMBIN TIME: 17.1 s — AB (ref 11.4–15.2)

## 2018-03-23 LAB — GLUCOSE, CAPILLARY
GLUCOSE-CAPILLARY: 113 mg/dL — AB (ref 65–99)
GLUCOSE-CAPILLARY: 123 mg/dL — AB (ref 65–99)
GLUCOSE-CAPILLARY: 150 mg/dL — AB (ref 65–99)
GLUCOSE-CAPILLARY: 84 mg/dL (ref 65–99)
Glucose-Capillary: 127 mg/dL — ABNORMAL HIGH (ref 65–99)
Glucose-Capillary: 113 mg/dL — ABNORMAL HIGH (ref 65–99)

## 2018-03-23 LAB — BASIC METABOLIC PANEL
ANION GAP: 8 (ref 5–15)
BUN: 38 mg/dL — ABNORMAL HIGH (ref 6–20)
CALCIUM: 7.3 mg/dL — AB (ref 8.9–10.3)
CO2: 25 mmol/L (ref 22–32)
CREATININE: 1.06 mg/dL (ref 0.61–1.24)
Chloride: 108 mmol/L (ref 101–111)
GFR calc Af Amer: >60 mL/min (ref >60)
GFR calc non Af Amer: >60 mL/min (ref >60)
Glucose, Bld: 134 mg/dL — ABNORMAL HIGH (ref 65–99)
Potassium: 3.8 mmol/L (ref 3.5–5.1)
SODIUM: 141 mmol/L (ref 135–145)

## 2018-03-23 LAB — COOXEMETRY PANEL
Carboxyhemoglobin: 1.5 % (ref 0.5–1.5)
Carboxyhemoglobin: 1.5 % (ref 0.5–1.5)
METHEMOGLOBIN: 1 % (ref 0.0–1.5)
Methemoglobin: 0.9 % (ref 0.0–1.5)
O2 Saturation: 65.7 %
O2 Saturation: 81.5 %
TOTAL HEMOGLOBIN: 9.2 g/dL — AB (ref 12.0–16.0)
TOTAL HEMOGLOBIN: 9.8 g/dL — AB (ref 12.0–16.0)

## 2018-03-23 LAB — MAGNESIUM
MAGNESIUM: 2.3 mg/dL (ref 1.7–2.4)
Magnesium: 2.1 mg/dL (ref 1.7–2.4)

## 2018-03-23 LAB — DIC (DISSEMINATED INTRAVASCULAR COAGULATION)PANEL
Fibrinogen: 366 mg/dL (ref 210–475)
Platelets: 40 10*3/uL — ABNORMAL LOW (ref 150–400)
Smear Review: NONE SEEN

## 2018-03-23 LAB — PHOSPHORUS
PHOSPHORUS: 2.2 mg/dL — AB (ref 2.5–4.6)
Phosphorus: 3.3 mg/dL (ref 2.5–4.6)

## 2018-03-23 LAB — PROCALCITONIN: Procalcitonin: 1.41 ng/mL

## 2018-03-23 LAB — PROTIME-INR
INR: 1.38
Prothrombin Time: 16.9 seconds — ABNORMAL HIGH (ref 11.4–15.2)

## 2018-03-23 MED ORDER — ALBUMIN HUMAN 5 % IV SOLN
12.5000 g | Freq: Once | INTRAVENOUS | Status: AC
  Administered 2018-03-23: 12.5 g via INTRAVENOUS
  Filled 2018-03-23: qty 250

## 2018-03-23 MED ORDER — DEXTROSE 5 % IV SOLN
30.0000 mmol | Freq: Once | INTRAVENOUS | Status: AC
  Administered 2018-03-23: 30 mmol via INTRAVENOUS
  Filled 2018-03-23: qty 10

## 2018-03-23 MED FILL — Heparin Sodium (Porcine) 2 Unit/ML in Sodium Chloride 0.9%: INTRAMUSCULAR | Qty: 1000 | Status: AC

## 2018-03-23 NOTE — Care Management Note (Signed)
Case Management Note Marvetta Gibbons RN, BSN Unit 4E-Case Manager-- [place] coverage [phone]  Patient Details  Name: Todd Bell MRN: [id number] Date of Birth: 11/30/1946  Subjective/Objective:  Pt admitted with HCAP and developed STEMI and had a clean cath. (Takotsubo cardiomyopathy: Cardiogenic shock) Currently on vent and sedated-03/23/18                Action/Plan: PTA pt lived at home with wife, hx of B cell lymphoma- CM to follow for transition of care needs.   Expected Discharge Date:                  Expected Discharge Plan:     In-House Referral:     Discharge planning Services  CM Consult  Post Acute Care Choice:    Choice offered to:     DME Arranged:    DME Agency:     HH Arranged:    HH Agency:     Status of Service:  In process, will continue to follow  If discussed at Long Length of Stay Meetings, dates discussed:    Discharge Disposition:   Additional Comments:  Dawayne Patricia, RN 03/23/2018, 2:27 PM

## 2018-03-23 NOTE — Progress Notes (Signed)
Nitric turned off at this time, sats 92 %. RT will continue to monitor as needed.

## 2018-03-23 NOTE — Progress Notes (Signed)
Progress Note  Patient Name: Todd Bell Date of Encounter: 03/23/2018  Primary Cardiologist: No primary care provider on file.   Subjective   Unable to obtain.  Intubated and sedated.  Inpatient Medications    Scheduled Meds: . artificial tears  1 application Both Eyes X9B  . chlorhexidine gluconate (MEDLINE KIT)  15 mL Mouth Rinse BID  . Chlorhexidine Gluconate Cloth  6 each Topical Daily  . fentaNYL (SUBLIMAZE) injection  50 mcg Intravenous Once  . heparin  5,000 Units Subcutaneous Q8H  . mouth rinse  15 mL Mouth Rinse 10 times per day  . sodium chloride flush  10-40 mL Intracatheter Q12H  . sodium chloride flush  3 mL Intravenous Q12H   Continuous Infusions: . sodium chloride    . sodium chloride    . cisatracurium (NIMBEX) infusion 3 mcg/kg/min (03/23/18 0800)  . famotidine (PEPCID) IV Stopped (03/22/18 2242)  . feeding supplement (VITAL AF 1.2 CAL) 65 mL/hr at 03/23/18 0800  . fentaNYL infusion INTRAVENOUS 250 mcg/hr (03/23/18 0800)  . midazolam (VERSED) infusion 2 mg/hr (03/23/18 0800)  . milrinone 0.125 mcg/kg/min (03/23/18 0800)  . norepinephrine (LEVOPHED) Adult infusion 12.053 mcg/min (03/23/18 0800)  . piperacillin-tazobactam (ZOSYN)  IV 3.375 g (03/23/18 0429)  . vancomycin Stopped (03/23/18 0111)   PRN Meds: sodium chloride, sodium chloride, acetaminophen (TYLENOL) oral liquid 160 mg/5 mL, bisacodyl, fentaNYL, fentaNYL (SUBLIMAZE) injection, midazolam, midazolam, sennosides, sodium chloride flush, sodium chloride flush   Vital Signs    Vitals:   03/23/18 0600 03/23/18 0610 03/23/18 0700 03/23/18 0800  BP: 97/73  (!) 88/69 (!) 83/70  Pulse: 87 88 89 90  Resp: (!) 25 (!) 25 (!) 25 (!) 25  Temp: (!) 97.5 F (36.4 C) 97.7 F (36.5 C) (!) 97.5 F (36.4 C) (!) 97 F (36.1 C)  TempSrc:      SpO2: 100% 100% 100% 100%  Weight:      Height:        Intake/Output Summary (Last 24 hours) at 03/23/2018 0818 Last data filed at 03/23/2018 0800 Gross per  24 hour  Intake 3577.82 ml  Output 1240 ml  Net 2337.82 ml   Filed Weights   04/01/2018 1815 03/22/18 0452 03/23/18 0431  Weight: 157 lb 3 oz (71.3 kg) 157 lb 3 oz (71.3 kg) 174 lb 2.6 oz (79 kg)    Telemetry    Sinus tachycardia.  Runs of atrial fibrillation vs. SVT.   - Personally Reviewed  ECG    Sinus tachycardia.  Rate 122 bpm.  Anterior ST depression. - Personally Reviewed  Physical Exam   VS:  BP (!) 83/70   Pulse 90   Temp (!) 97 F (36.1 C)   Resp (!) 25   Ht 6' (1.829 m)   Wt 174 lb 2.6 oz (79 kg)   SpO2 100%   BMI 23.62 kg/m  , BMI Body mass index is 23.62 kg/m. GENERAL:  Critically ill-appearing.  Intubated.  HEENT: Pupils equal round and reactive, fundi not visualized, oral mucosa unremarkable NECK:  No jugular venous distention, waveform within normal limits, carotid upstroke brisk and symmetric, no bruits LUNGS:  Clear to auscultation bilaterally on anterior exam HEART:  Tachycardic.  Regular rhythm.  PMI not displaced or sustained,S1 and S2 within normal limits, no S3, no S4, no clicks, no rubs, no murmurs ABD:  Flat, positive bowel sounds normal in frequency in pitch, no bruits, no rebound, no guarding, no midline pulsatile mass, no hepatomegaly, no splenomegaly EXT:  2  plus pulses throughout. LEwarm.  No edema, no cyanosis no clubbing SKIN:  No rashes no nodules NEURO:  Unable to assess PSYCH:  Unable to assess.    Labs    Chemistry Recent Labs  Lab 04/01/2018 1050 04/06/2018 1822 04/05/2018 2258 03/22/18 0917 03/23/18 0438  NA 144 144  --  144 141  K 5.6* 5.0  --  4.5 3.8  CL 111 112*  --  110 108  CO2 18* 20*  --  24 25  GLUCOSE 130* 169*  --  115* 134*  BUN 54* 59*  --  48* 38*  CREATININE 1.64* 1.33* 1.22 1.19 1.06  CALCIUM 7.6* 7.8*  --  7.6* 7.3*  PROT 6.3* 6.8  --   --  5.0*  ALBUMIN 2.5* 2.8*  --   --  2.0*  AST 617* 900*  --   --  215*  ALT 537* 599*  --   --  462*  ALKPHOS 83 90  --   --  79  BILITOT 1.8* 0.7  --   --  0.9    GFRNONAA 40* 51* 57* 58* >60  GFRAA 46* 59* >60 >60 >60  ANIONGAP 15 12  --  10 8     Hematology Recent Labs  Lab 03/28/2018 2258 03/22/18 0917 03/23/18 0438  WBC 7.5 5.6 6.3  RBC 3.33* 3.22* 2.89*  HGB 10.3* 10.1* 8.9*  HCT 32.7* 32.5* 28.7*  MCV 98.2 100.9* 99.3  MCH 30.9 31.4 30.8  MCHC 31.5 31.1 31.0  RDW 16.6* 16.8* 16.2*  PLT 70* 48* 46*    Cardiac Enzymes Recent Labs  Lab 03/13/2018 1050 03/09/2018 1822  TROPONINI 5.23* 10.42*   No results for input(s): TROPIPOC in the last 168 hours.   BNP Recent Labs  Lab 03/29/2018 1050  BNP 621.7*     DDimer  Recent Labs  Lab 03/31/2018 1822  DDIMER >20.00*     Radiology    Ct Chest Wo Contrast  Result Date: 03/31/2018 CLINICAL DATA:  Respiratory distress EXAM: CT CHEST WITHOUT CONTRAST TECHNIQUE: Multidetector CT imaging of the chest was performed following the standard protocol without IV contrast. COMPARISON:  Chest radiograph dated 03/31/2018. PET-CT dated 12/24/2017. CT chest dated 10/23/2017. FINDINGS: Cardiovascular: The heart is normal in size. No pericardial effusion. No evidence of thoracic aortic aneurysm. Atherosclerotic calcifications of the aortic arch. Coronary atherosclerosis the LAD and left circumflex. Left chest port terminates in the lower SVC. Mediastinum/Nodes: No suspicious mediastinal lymphadenopathy. Visualized thyroid is unremarkable. Lungs/Pleura: Endotracheal tube terminates 3.8 cm above the carina. Ground-glass opacity in the bilateral upper and lower lobes in a perihilar distribution with coarsened interstitial markings. Associated traction bronchiectasis. This appearance favors chronic interstitial lung disease which is progressive. The appearance does not favor a UIP pattern/idiopathic pulmonary fibrosis but may reflect NSIP. Calcified pleural plaques in the anterior right hemithorax. No pleural effusion or pneumothorax. Upper Abdomen: Enteric tube courses into the proximal stomach. Musculoskeletal:  Mild degenerative changes of the visualized thoracolumbar spine. IMPRESSION: Endotracheal tube terminates 3.8 cm above the carina. Scattered ground-glass opacity with coarse interstitial markings and bronchiectasis, favoring progressive chronic interstitial lung disease. Aortic Atherosclerosis (ICD10-I70.0). Electronically Signed   By: Julian Hy M.D.   On: 03/31/2018 16:59   Dg Chest Port 1 View  Result Date: 03/23/2018 CLINICAL DATA:  ET tube EXAM: PORTABLE CHEST 1 VIEW COMPARISON:  03/22/2018 FINDINGS: Swan-Ganz catheter is in the left pulmonary artery, stable. Left Port-A-Cath tip in the upper right atrium, stable. Endotracheal tube and  NG tube are unchanged. Cardiomegaly with vascular congestion. Stable interstitial thickening/prominence. No visible significant effusions. No change since prior study. IMPRESSION: No interval change. Electronically Signed   By: Rolm Baptise M.D.   On: 03/23/2018 07:44   Dg Chest Port 1 View  Result Date: 03/22/2018 CLINICAL DATA:  Acute respiratory failure. EXAM: PORTABLE CHEST 1 VIEW COMPARISON:  Chest radiograph March 21, 2018 and CT chest March 21, 2018 FINDINGS: Endotracheal tube tip projects 4.5 cm above the carina. Nasogastric tube side port projecting in proximal stomach, tip out of field-of-view. Single lumen LEFT chest Port-A-Cath distal tip in mid superior vena cava. The cardiac silhouette is mildly enlarged. Similar fullness of the hila corresponding to vascular structures on recent CT. Mildly calcified aortic arch. Stable faint nodular density LEFT upper lung zone without discrete nodule on yesterday's CT. Similar interstitial prominence without pleural effusion or focal consolidation. No pneumothorax. Soft tissue planes and included osseous structures are unchanged. IMPRESSION: No apparent change in life-support lines. Stable interstitial changes.  Stable cardiomegaly. Aortic Atherosclerosis (ICD10-I70.0). Electronically Signed   By: Elon Alas  M.D.   On: 03/22/2018 01:51   Dg Chest Portable 1 View  Result Date: 03/30/2018 CLINICAL DATA:  Hypoxia EXAM: PORTABLE CHEST 1 VIEW COMPARISON:  Chest radiograph and chest CT obtained earlier in the day FINDINGS: Endotracheal tube tip is 4.0 cm above the carina. Nasogastric tube tip and side port are below the diaphragm with side port seen in the stomach. Port-A-Cath tip in superior vena cava. No pneumothorax. There is hazy opacity in both mid lungs. Heart is mildly enlarged with pulmonary vascularity within normal limits. No adenopathy. No bone lesions. IMPRESSION: Hazy opacity in both mid lungs, similar to earlier in the day. Suspect a degree of underlying pneumonitis. Stable cardiac prominence. Tube and catheter positions as described without pneumothorax. Electronically Signed   By: Lowella Grip III M.D.   On: 03/13/2018 16:49   Dg Chest Portable 1 View  Result Date: 03/10/2018 CLINICAL DATA:  ETT and OG tube placement. EXAM: PORTABLE CHEST 1 VIEW COMPARISON:  Chest x-ray from same day at 10:45 a.m. FINDINGS: Interval placement of an endotracheal tube with the tip approximately 4.2 cm above the level of the carina. Enteric tube in place with the distal side port near the gastroesophageal junction. Unchanged left chest wall port catheter with the tip in the distal SVC. Stable cardiomegaly. Unchanged perihilar and left mid lung interstitial thickening. No pleural effusion or pneumothorax. No acute osseous abnormality. IMPRESSION: 1. Appropriately positioned endotracheal tube. 2. Enteric tube in place with the distal side port near the gastroesophageal junction. Recommend advancement 2-3 cm. 3. Unchanged perihilar and left mid lung interstitial opacities which may reflect edema or interstitial pneumonitis related to infectious or inflammatory process. Electronically Signed   By: Titus Dubin M.D.   On: 03/10/2018 13:30   Dg Chest Port 1 View  Result Date: 03/19/2018 CLINICAL DATA:  Found  unresponsive at home. History of cough. History of non-Hodgkin's lymphoma. EXAM: PORTABLE CHEST 1 VIEW COMPARISON:  Chest radiograph 10/05/2017 FINDINGS: Patient has a left jugular Port-A-Cath and the tip is in a stable position within the SVC. Mildly decreased lung volumes with increased interstitial densities in both lungs. Interstitial densities are most prominent in the left mid lung region. Cardiomediastinal silhouette is prominent but may be accentuated by AP technique. IMPRESSION: Interstitial densities in both lungs most prominent in the left lung. Findings are suggestive for interstitial edema. Cannot exclude an infectious or inflammatory process, particularly  in the left lung. Question mild cardiomegaly. Electronically Signed   By: Markus Daft M.D.   On: 03/19/2018 11:08    Cardiac Studies   LHC/RHC 04/04/2018:  Angiographically normal coronary arteries with no notable disease.  There is moderate to severe left ventricular systolic dysfunction. EF estimated at roughly 25-35%. -In a Takotsubo pattern  LV end diastolic pressure is normal.  Relatively normal right heart cath pressures.   ASSESSMENT:  Angiographically normal coronary arteries with no obstructive disease to explain elevated troponins.  Grossly abnormal Left Ventriculogram Suggesting Takotsubo Cardiomyopathy  Mildly reduced Cardiac Output/Index with Essentially Normal Right Heart Pressures.  Arterial and venous blood gases have been uploaded to the epic system  RA 8 PA 35/6, 25 RV 37/3, 11 PCWP 11  Echo 03/23/18: Study Conclusions  - Left ventricle: Poor acoustical windows preclude accurated   assessment of EF and wall motion. Recommend defininty study to   evaluate further. The cavity size was normal. There was an   increased relative contribution of atrial contraction to   ventricular filling. Doppler parameters are consistent with   abnormal left ventricular relaxation (grade 1 diastolic   dysfunction). -  Aortic valve: There was mild regurgitation. - Right ventricle: The cavity size was moderately dilated. Wall   thickness was normal. - Pulmonary arteries: PA peak pressure: 45 mm Hg (S).  Impressions:  - The right ventricular systolic pressure was increased consistent   with moderate pulmonary hypertension.  Recommendations:  Poor acoustical windows preclude accurated assessment of EF and wall motion. Recommend defininty study to evaluate further.  Patient Profile     72 y.o. male with B cell lymphoma with partial response to R CHOP found unresponsive, hypotensive and hypoxic at home on 03/16/2018.  Assessment & Plan    # Takotsubo cardiomyopathy:  # Cardiogenic shock: Normal coronary arteries on LHC and diffuse mid-apical hypokinesis.  Echocardiogram had poor acoustic windows.  Will repeat with Definity contrast. He has been febrile this admission though there is no clear infectious source.  He hasn't complained of HF symptoms prior to admission.  No new stressors though family thinks he may have been stressed about upcoming cancer imaging.  Co-ox has improved from 40-->65 after the addition of milrinone yesterday.  He is no longer cold peripherally.    D-dimer is >20.  Pulmonary pressure mildly elevated making massive PE unlikely.  He was net +2L yesterday and CVP is mildly elevated.  We will wait on diuresis for now given his hypotension and pressor requirement.  Shock liver is improving and renal function is stable. Continue milrinone and levophed.  # B cell lymphoma: Partially responsive to R CHOP.  Was awaiting staging PET CT.  Completed chemotherapy 01/2018.   # Hypoxic respiratory failure:  # Respiratory acidosis:  Improving on vent. PH now 7.4.  Likely weaning FiO2 based on pO2 234 this am.   # Anemia/Thrombocytopenia: Will send DIC panel. Transfuse for hgb <8.  Family (wife, 2 daughers) was updated at the bedside.    Time spent: 45 minutes-Greater than 50% of this time was  spent in counseling, explanation of diagnosis, planning of further management, and coordination of care.   For questions or updates, please contact [place] Please consult [email or web address] for contact info under Cardiology/STEMI.      Signed, Skeet Latch, MD  03/23/2018, 8:18 AM

## 2018-03-23 NOTE — Progress Notes (Addendum)
PULMONARY / CRITICAL CARE MEDICINE   Name: Todd Bell MRN: [id number] DOB: 02/24/1946    ADMISSION DATE:  03/11/2018 CONSULTATION DATE: 03/26/2018  REFERRING MD: [facility]  CHIEF COMPLAINT: Shock and respiratory failure.  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  His wife reports a 5-day history of dyspnea without chest pain possibly associated with orthopnea. He appeared diaphoretic and clammy.  He was then found unresponsive.  Saturations were in the 19s. He was intubated and treatment was started for a presumptive diagnosis of community-acquired pneumonia.  Upon returning from a CT scan of the chest he became increasingly hypotensive and hypoxic.  SUBJECTIVE:  Oxygenation improving. Worsening shock overnight. Responded to IVF   VITAL SIGNS: BP 92/73   Pulse 94   Temp (!) 97 F (36.1 C)   Resp (!) 25   Ht 6' (1.829 m)   Wt 79 kg (174 lb 2.6 oz)   SpO2 100%   BMI 23.62 kg/m   HEMODYNAMICS: PAP: (33-54)/(13-27) 36/18 CVP:  [10 mmHg-12 mmHg] 12 mmHg PCWP:  [11 mmHg-15 mmHg] 12 mmHg CO:  [3.9 L/min-4.3 L/min] 3.9 L/min CI:  [2 L/min/m2-2.2 L/min/m2] 2 L/min/m2On norepinephrine 12 mcg/min  VENTILATOR SETTINGS: Vent Mode: PCV FiO2 (%):  [40 %-100 %] 40 % Set Rate:  [25 bmp] 25 bmp PEEP:  [10 YIF02-77 cmH20] 10 cmH20 Plateau Pressure:  [27 cmH20-29 cmH20] 28 cmH20  INTAKE / OUTPUT: I/O last 3 completed shifts: In: 5309.8 [I.V.:3818.5; NG/GT:858; IV Piggyback:633.3] Out: 2365 [Urine:2365]  PHYSICAL EXAMINATION:  General: Elderly male of normal body habitus Neuro: Sedated/paralyzed  HEENT: [place]/AT, PERRL, mild JVD Cardiovascular: tachy, regular, Nl S1/S2 and -M/R/G Lungs: More clear today.  Abdomen: Soft, NT, ND and +BS Musculoskeletal: -edema and  -tenderness Skin: Grossly intact  LABS:  BMET Recent Labs  Lab 03/13/2018 1822 03/10/2018 2258 03/22/18 0917 03/23/18 0438  NA 144  --  144 141  K 5.0  --  4.5 3.8  CL 112*  --  110 108  CO2 20*  --  24 25  BUN 59*  --  48* 38*  CREATININE 1.33* 1.22 1.19 1.06  GLUCOSE 169*  --  115* 134*   Electrolytes Recent Labs  Lab 03/17/2018 1822 03/22/18 0917 03/22/18 1558 03/23/18 0438  CALCIUM 7.8* 7.6*  --  7.3*  MG  --  2.4 2.4 2.3  PHOS  --  4.7* 3.9 2.2*   CBC Recent Labs  Lab 03/20/2018 2258 03/22/18 0917 03/23/18 0438  WBC 7.5 5.6 6.3  HGB 10.3* 10.1* 8.9*  HCT 32.7* 32.5* 28.7*  PLT 70* 48* 46*   Coag's Recent Labs  Lab 03/22/2018 1822 03/23/18 0438  APTT 33  --   INR 1.43 1.38   Sepsis Markers Recent Labs  Lab 03/17/2018 1053 03/29/2018 1356 03/09/2018 1822 03/25/2018 1904 03/23/18 0438  LATICACIDVEN 4.82* 3.24*  --  1.9  --   PROCALCITON  --   --  2.12  --  1.41   ABG Recent Labs  Lab 03/22/18 0529 03/22/18 2030 03/23/18 0440  PHART 7.291* 7.401 7.407  PCO2ART 43.9 47.7 42.3  PO2ART 97.0 378.0* 234*   Liver Enzymes Recent Labs  Lab 03/08/2018 1050 03/19/2018 1822 03/23/18 0438  AST 617* 900* 215*  ALT 537* 599* 462*  ALKPHOS 83 90 79  BILITOT 1.8* 0.7 0.9  ALBUMIN 2.5* 2.8* 2.0*   Cardiac Enzymes Recent Labs  Lab 03/18/2018 1050 03/13/2018 1822  TROPONINI 5.23* 10.42*   Glucose Recent Labs  Lab 03/22/18 1600 03/22/18 2013 03/22/18 2358 03/23/18 0422 03/23/18 0732  GLUCAP 98 145* 84 123* 113*    Imaging Dg Chest Port 1 View  Result Date: 03/23/2018 CLINICAL DATA:  ET tube EXAM: PORTABLE CHEST 1 VIEW COMPARISON:  03/22/2018 FINDINGS: Swan-Ganz catheter is in the left pulmonary artery, stable. Left Port-A-Cath tip in the upper right atrium, stable. Endotracheal tube and NG tube are unchanged. Cardiomegaly with vascular congestion. Stable interstitial thickening/prominence. No visible significant effusions. No change since prior study.  IMPRESSION: No interval change. Electronically Signed   By: Rolm Baptise M.D.   On: 03/23/2018 07:44     STUDIES:  Serial EKGs show dynamic anterior T wave inversions most prominently in leads V1 through V4.  Some normalization has occurred with improved blood pressure.  CULTURES: Blood cultures Coag neg staph. ? Contaminant.   ANTIBIOTICS: Currently on vancomycin and Zosyn   LINES/TUBES: Foley catheter, 7.5 mm endotracheal tube, right radial arterial line, pressors infusing through left subclavian Port-A-Cath.  DISCUSSION: Severe shock initially thought to be due to to sepsis from pneumonia.  However, sepsis screen score was low and chest imaging more consistent with interstitial edema. Furthermore, patient had a significantly elevated troponin, new and dynamic anterior T wave inversions and a focused echocardiogram showing septal akinesis consistent with infarction in the area seen on the EKGs. He went to cath lab, which demonstrated clean coronaries. Echo consistent with Takotsubo cardiomyopathy. Course complicated by shock and hypoxia requiring high support mechanical ventilation and pressors. Remained refractory. Milrinone and inhaled nitric added 4/15 and he responded to these therapies.   ASSESSMENT / PLAN:  PULMONARY A: Acute hypoxic respiratory failure: due to pulmonary edema in the setting of acute heart failure/ Takotsubo P:   Lung protective low tidal volume ventilation. High PEEP/Fio2 Neuromuscular blockade to maintain ventilator synchrony. Inhaled nitric wean off today. Wean by 5PPM q 2 hours.  DC nimbex Drop PEEP to 10. Will need to watch closely as he remains on pressure control.  CARDIOVASCULAR A:  Cardiogenic shock, initially cold and wet hemodynamic profile.  Perfusion has improved with initiation of norepinephrine.  Leading dx in differential at this point is [place]. Clean cath.   P:  Cards following, will defer to cards regarding stress induced  cardiomyopathy Milrinone and norepinephrine infusions: MAP goal 65 Repeat Echo today.  RENAL A:   Evolving acute kidney injury initially.  Urine output has improved with restoration of normal blood pressure. P:   Continue to monitor urine output. BMET in now and in AM Replace electrolytes as indicated (kphos 11mmol today) Begin TF  GASTROINTESTINAL A:   No active abdominal process.  Appears adequately nourished. LFTs elevated, ?shocked liver P:   TF per nutrition LFT and coags in AM  HEMATOLOGIC A:   Anemia of acute illness as well as hematologic malignancy.   Thrombocytopenia, possibly due to acute marrow suppression from acute illness. HIT? DIC? Doubt.  B-cell lymphoma.  P:  CBC in AM Transfuse per ICU protocol DC heparin and sent HIT panel.  DIC pending.   INFECTIOUS A:   Shock state which on further examination does not appear to be infectious in origin. P:   Continue antibiotics until clinical situation is clarified or drop in PCT  ENDOCRINE A:   Acceptable glycemic control P:   Initiate insulin  therapy to maintain euglycemia.  NEUROLOGIC A:   Neurologically normal at baseline.  Currently sedated to ensure comfort and ventilator compliance. P:   RASS goal: -4 Versed and fentanyl drip Nimbex while having such difficulty oxygenating - will attempt to wean this today.   FAMILY  - Updates: Wife updated at length. Informed of progress, but clear he remains critically ill.    Summary: PO2 improved significantly today (80s to now 234). Co-ox better as well. Will DC nimbex and wean nitric to off. Will leave vent alone for now to see his response to the above changes. Repeat gas this evening. Thrombocytopenia consistently worse. Critical illness on chronic thrombocytopenia (lymphoma) vs HIT. Less likely DIC. Will send HIT paned. DIC pending. DC heparin. Start SCDs.  Georgann Housekeeper, AGACNP-BC [place] [facility]/Critical Care Pager [phone] or [phone]  03/23/2018 10:54 AM  Attending Note:  72 year old male with PMH of lymphoma presenting with HCAP and developed STEMI and had a clean cath.  Overnight, placed on nitric for ARDS.  Patient's oxygenation improved dramatically on PEEP.  On exam, remains paralyzed with coarse BS diffusely.  I reviewed CXR myself, ETT is in good position.  Discussed with PCCM-NP.  Will titrate nitric to off today at 5 PPM every 2 hours with close monitoring.  Maintain PEEP at 10.  Would not titrate O2 any further at this point.  Continue abx.  Family updated bedside.  PCCM will continue to follow.  The patient is critically ill with multiple organ systems failure and requires high complexity decision making for assessment and support, frequent evaluation and titration of therapies, application of advanced monitoring technologies and extensive interpretation of multiple databases.   Critical Care Time devoted to patient care services described in this note is  35  Minutes. This time reflects time of care of this signee Dr Jennet Maduro. This critical care time does not reflect procedure time, or teaching time or supervisory time of PA/NP/Med student/Med Resident etc but could involve care discussion time.  Rush Farmer, M.D. [facility] Pulmonary/Critical Care Medicine. Pager: [phone]. After hours pager: [phone].

## 2018-03-23 NOTE — Progress Notes (Signed)
LB PCCM PROGRESS NOTE  S: 72 year old male with presumed Takotsubo Cardiomyopathy causing shock and profound hypoxia. ABG better this am. We have DC nimbex and weaned nitric to nearly off today. O2 maintaining. He has become febrile and tachycardic this afternoon with worsening shock. Cultures from 2 days ago still pending. He is on broad ABX.   O: BP 100/73   Pulse (!) 138   Temp (!) 101.8 F (38.8 C) (Core)   Resp (!) 24   Ht 6' (1.829 m)   Wt 79 kg (174 lb 2.6 oz)   SpO2 95%   BMI 23.62 kg/m   General:  Elderly male renting comfortably in bed on vent Neuro:  Sedated, occasional dyssynchrony with vent. Deep sedation.  HEENT:  [place]/AT, PERRL, no JVD Cardiovascular:  Tachy, regular, no MRG. Nonpitting edema upper extremities primarily.  Lungs:  Coarse Abdomen:  Soft, non-tender, non-distended Musculoskeletal:  No acute deformity Skin:  Intact, MMM   A/P:  This afternoon with worsening tachycardia (sinus), fever, and hypotension - Wondering if he is intravascularly dry despite the upper extremity edema. Will try some albumin - Tylenol given for fever. Now ice packs. Continue ABX.   Acute hypoxemic respiratory failure - continue to wean nitric. Now down to 4ppm.     Georgann Housekeeper, ACNP [facility] Pulmonology/Critical Care Pager [phone] or [phone]

## 2018-03-24 ENCOUNTER — Other Ambulatory Visit: Payer: Self-pay | Admitting: Pulmonary Disease

## 2018-03-24 ENCOUNTER — Inpatient Hospital Stay (HOSPITAL_COMMUNITY): Payer: Medicare Other

## 2018-03-24 DIAGNOSIS — J9600 Acute respiratory failure, unspecified whether with hypoxia or hypercapnia: Secondary | ICD-10-CM

## 2018-03-24 DIAGNOSIS — Z515 Encounter for palliative care: Secondary | ICD-10-CM

## 2018-03-24 DIAGNOSIS — Z7189 Other specified counseling: Secondary | ICD-10-CM

## 2018-03-24 DIAGNOSIS — D696 Thrombocytopenia, unspecified: Secondary | ICD-10-CM

## 2018-03-24 DIAGNOSIS — J811 Chronic pulmonary edema: Secondary | ICD-10-CM

## 2018-03-24 DIAGNOSIS — J810 Acute pulmonary edema: Secondary | ICD-10-CM

## 2018-03-24 LAB — COOXEMETRY PANEL
CARBOXYHEMOGLOBIN: 0.9 % (ref 0.5–1.5)
Carboxyhemoglobin: 1.3 % (ref 0.5–1.5)
Carboxyhemoglobin: 1 % (ref 0.5–1.5)
METHEMOGLOBIN: 1.4 % (ref 0.0–1.5)
Methemoglobin: 1.6 % — ABNORMAL HIGH (ref 0.0–1.5)
Methemoglobin: 1.4 % (ref 0.0–1.5)
O2 SAT: 47.8 %
O2 SAT: 39.6 %
O2 SAT: 54.1 %
TOTAL HEMOGLOBIN: 9.3 g/dL — AB (ref 12.0–16.0)
TOTAL HEMOGLOBIN: 12.7 g/dL (ref 12.0–16.0)
TOTAL HEMOGLOBIN: 9.1 g/dL — AB (ref 12.0–16.0)

## 2018-03-24 LAB — POCT I-STAT 3, ART BLOOD GAS (G3+)
ACID-BASE EXCESS: 1 mmol/L (ref 0.0–2.0)
Acid-base deficit: 1 mmol/L (ref 0.0–2.0)
Bicarbonate: 23.8 mmol/L (ref 20.0–28.0)
Bicarbonate: 26.1 mmol/L (ref 20.0–28.0)
Bicarbonate: 25.4 mmol/L (ref 20.0–28.0)
O2 SAT: 83 %
O2 SAT: 84 %
O2 Saturation: 90 %
PCO2 ART: 41.9 mmHg (ref 32.0–48.0)
PO2 ART: 65 mmHg — AB (ref 83.0–108.0)
PO2 ART: 56 mmHg — AB (ref 83.0–108.0)
Patient temperature: 38.3
Patient temperature: 38.2
TCO2: 25 mmol/L (ref 22–32)
TCO2: 27 mmol/L (ref 22–32)
TCO2: 27 mmol/L (ref 22–32)
pCO2 arterial: 48.1 mmHg — ABNORMAL HIGH (ref 32.0–48.0)
pCO2 arterial: 47.3 mmHg (ref 32.0–48.0)
pH, Arterial: 7.369 (ref 7.350–7.450)
pH, Arterial: 7.348 — ABNORMAL LOW (ref 7.350–7.450)
pH, Arterial: 7.343 — ABNORMAL LOW (ref 7.350–7.450)
pO2, Arterial: 54 mmHg — ABNORMAL LOW (ref 83.0–108.0)

## 2018-03-24 LAB — BASIC METABOLIC PANEL
Anion gap: 10 (ref 5–15)
BUN: 27 mg/dL — AB (ref 6–20)
CO2: 25 mmol/L (ref 22–32)
CREATININE: 0.87 mg/dL (ref 0.61–1.24)
Calcium: 7.5 mg/dL — ABNORMAL LOW (ref 8.9–10.3)
Chloride: 105 mmol/L (ref 101–111)
Glucose, Bld: 165 mg/dL — ABNORMAL HIGH (ref 65–99)
POTASSIUM: 4 mmol/L (ref 3.5–5.1)
SODIUM: 140 mmol/L (ref 135–145)

## 2018-03-24 LAB — CBC
HCT: 27.9 % — ABNORMAL LOW (ref 39.0–52.0)
HCT: 29 % — ABNORMAL LOW (ref 39.0–52.0)
Hemoglobin: 8.8 g/dL — ABNORMAL LOW (ref 13.0–17.0)
Hemoglobin: 9 g/dL — ABNORMAL LOW (ref 13.0–17.0)
MCH: 31.7 pg (ref 26.0–34.0)
MCH: 30.7 pg (ref 26.0–34.0)
MCHC: 31.5 g/dL (ref 30.0–36.0)
MCHC: 31 g/dL (ref 30.0–36.0)
MCV: 100.4 fL — ABNORMAL HIGH (ref 78.0–100.0)
MCV: 99 fL (ref 78.0–100.0)
PLATELETS: 33 10*3/uL — AB (ref 150–400)
PLATELETS: 35 10*3/uL — AB (ref 150–400)
RBC: 2.78 MIL/uL — AB (ref 4.22–5.81)
RBC: 2.93 MIL/uL — ABNORMAL LOW (ref 4.22–5.81)
RDW: 16.2 % — ABNORMAL HIGH (ref 11.5–15.5)
RDW: 16.1 % — AB (ref 11.5–15.5)
WBC: 6.4 10*3/uL (ref 4.0–10.5)
WBC: 6.9 10*3/uL (ref 4.0–10.5)

## 2018-03-24 LAB — BLOOD GAS, ARTERIAL
Acid-Base Excess: 1.6 mmol/L (ref 0.0–2.0)
Bicarbonate: 26.2 mmol/L (ref 20.0–28.0)
FIO2: 60
O2 SAT: 89.9 %
PATIENT TEMPERATURE: 99.9
PCO2 ART: 46.4 mmHg (ref 32.0–48.0)
PEEP: 10 cmH2O
PO2 ART: 64.5 mmHg — AB (ref 83.0–108.0)
Pressure control: 18 cmH2O
RATE: 25 resp/min
pH, Arterial: 7.374 (ref 7.350–7.450)

## 2018-03-24 LAB — GLUCOSE, CAPILLARY
GLUCOSE-CAPILLARY: 169 mg/dL — AB (ref 65–99)
GLUCOSE-CAPILLARY: 133 mg/dL — AB (ref 65–99)
Glucose-Capillary: 128 mg/dL — ABNORMAL HIGH (ref 65–99)
Glucose-Capillary: 144 mg/dL — ABNORMAL HIGH (ref 65–99)

## 2018-03-24 LAB — CULTURE, BLOOD (ROUTINE X 2): Special Requests: ADEQUATE

## 2018-03-24 LAB — HEPARIN INDUCED PLATELET AB (HIT ANTIBODY): Heparin Induced Plt Ab: 0.122 OD (ref 0.000–0.400)

## 2018-03-24 LAB — VANCOMYCIN, TROUGH: Vancomycin Tr: 11 ug/mL — ABNORMAL LOW (ref 15–20)

## 2018-03-24 MED ORDER — VANCOMYCIN HCL IN DEXTROSE 1-5 GM/200ML-% IV SOLN
1000.0000 mg | Freq: Two times a day (BID) | INTRAVENOUS | Status: DC
  Filled 2018-03-24: qty 200

## 2018-03-24 MED ORDER — FENTANYL BOLUS VIA INFUSION
50.0000 ug | INTRAVENOUS | Status: DC | PRN
  Administered 2018-03-24: 50 ug via INTRAVENOUS
  Filled 2018-03-24: qty 200

## 2018-03-24 MED ORDER — MIDAZOLAM BOLUS VIA INFUSION (WITHDRAWAL LIFE SUSTAINING TX)
5.0000 mg | INTRAVENOUS | Status: DC | PRN
  Filled 2018-03-24: qty 20

## 2018-03-24 MED ORDER — MILRINONE LACTATE IN DEXTROSE 20-5 MG/100ML-% IV SOLN
0.2500 ug/kg/min | INTRAVENOUS | Status: DC
  Administered 2018-03-24: 0.25 ug/kg/min via INTRAVENOUS

## 2018-03-24 MED ORDER — FENTANYL 2500MCG IN NS 250ML (10MCG/ML) PREMIX INFUSION: 100.0000 ug/h | INTRAVENOUS | Status: DC

## 2018-03-24 MED ORDER — SODIUM CHLORIDE 0.9 % IV SOLN
10.0000 mg/h | INTRAVENOUS | Status: DC
  Filled 2018-03-24: qty 10

## 2018-03-24 MED ORDER — FUROSEMIDE 10 MG/ML IJ SOLN
40.0000 mg | Freq: Once | INTRAMUSCULAR | Status: AC
  Administered 2018-03-24: 40 mg via INTRAVENOUS
  Filled 2018-03-24: qty 4

## 2018-03-25 ENCOUNTER — Telehealth: Payer: Self-pay | Admitting: Pulmonary Disease

## 2018-03-25 NOTE — Telephone Encounter (Signed)
03/25/18 received D/C from [facility], dropped off for Dr. Nelda Marseille at [facility] to be signed.  PWR

## 2018-03-26 LAB — CULTURE, BLOOD (ROUTINE X 2)
Culture: NO GROWTH
Special Requests: ADEQUATE

## 2018-03-27 LAB — CULTURE, BLOOD (ROUTINE X 2)
CULTURE: NO GROWTH
CULTURE: NO GROWTH
SPECIAL REQUESTS: ADEQUATE
Special Requests: ADEQUATE

## 2018-03-29 ENCOUNTER — Telehealth: Payer: Self-pay

## 2018-03-29 NOTE — Telephone Encounter (Signed)
On 03/29/18 I received the d/c back from Doctor Nelda Marseille. I got the d/c ready and called the funeral home to let them know the d/c is ready for pickup.  I also faxed a copy to the funeral home per the funeral home request.

## 2018-03-30 NOTE — Telephone Encounter (Signed)
03/29/18 picked up D/c from Dr. Nelda Marseille and [facility] picked up. PWR

## 2018-04-07 NOTE — Progress Notes (Signed)
Pharmacy Antibiotic Note  Todd Bell is a 72 y.o. male admitted on 03/08/2018 with shock, respiratory failure.  Pharmacy has been dosing vancomycin and Zosyn dosing for r/o HCAP. On D#3 of abx. WBC wnl w/ low grade fevers. PCT 1.41 on 4/16.   An 11 hour vancomycin trough today was subtherapeutic at 11  Plan:  Zosyn 3.375g IV Q8H infused over 4hrs.  Increase vancomycin to 1 gm IV Q 12 hours   Measure Vanc trough at steady state.  Follow up renal fxn, culture results, and clinical course.   Height: 6' (182.9 cm) Weight: 179 lb 0.2 oz (81.2 kg) IBW/kg (Calculated) : 77.6  Temp (24hrs), Avg:100.4 F (38 C), Min:98.2 F (36.8 C), Max:101.8 F (38.8 C)  Recent Labs  Lab 03/22/2018 1053 04/04/2018 1356 03/22/2018 1822 04/01/2018 1904 03/20/2018 2258 03/22/18 0917 03/23/18 0438 09-Apr-2018 0430 04/09/18 1137  WBC  --   --  8.3  --  7.5 5.6 6.3 6.4 6.9  CREATININE  --   --  1.33*  --  1.22 1.19 1.06 0.87  --   LATICACIDVEN 4.82* 3.24*  --  1.9  --   --   --   --   --   VANCOTROUGH  --   --   --   --   --   --   --   --  11*    Estimated Creatinine Clearance: 81.8 mL/min (by C-G formula based on SCr of 0.87 mg/dL).    No Known Allergies  Thank you for allowing pharmacy to be a part of this patient's care.  Albertina Parr, PharmD., BCPS Clinical Pharmacist Clinical phone for 09-Apr-2018 until 3:30pm: [phone] If after 3:30pm, please call main pharmacy at: [phone]

## 2018-04-07 NOTE — Progress Notes (Signed)
Called to room by RN due to patient desat. Upon arrival Pt SPo2 80-82% with great pleth. RN had placed on 100% (o2 breaths) and rose to 84%. Slowly increased PEEP and FIo2 from +10 and 60% to +14 and 100%. Pt not 85-87%. Obtaining ABG to see if Spo2 accurate.

## 2018-04-07 NOTE — Death Summary Note (Signed)
DEATH SUMMARY   Patient Details  Name: Todd Bell MRN: [id number] DOB: Apr 19, 1946  Admission/Discharge Information   Admit Date:  04-20-2018  Date of Death: Date of Death: 2018/04/23  Time of Death: Time of Death: 02-18-1620  Length of Stay: 3  Referring Physician: Lujean Amel, MD   Reason(s) for Hospitalization  Septic shock and respiratory failure  Diagnoses  Preliminary cause of death:   Septic shock  Secondary Diagnoses (including complications and co-morbidities):  Active Problems:   Respiratory failure with hypoxia (HCC)   Cardiogenic shock (HCC)   Shock, septic (HCC)   Malnutrition of moderate degree   Acute respiratory failure (HCC)   Pulmonary edema   Thrombocytopenia (HCC) Acute renal failure Shocked liver ARDS Anemia  Brief Hospital Course (including significant findings, care, treatment, and services provided and events leading to death)  72 year old man in previously good health until he developed a 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  His wife reports a 5-day history of dyspnea without chest pain possibly associated with orthopnea. He appeared diaphoretic and clammy.  He was then found unresponsive.  Saturations were in the 74s. He was intubated and treatment was started for a presumptive diagnosis of community-acquired pneumonia.  Upon returning from a CT scan of the chest he became increasingly hypotensive and hypoxic.  Patient failed to make much progress and ARDS worsened.  Met with family, informed them that without rather aggressive interventions that patient will not survive.  He had made his wishes clear to family that he would not want that level of care and family elected comfort measures.  Morphine was started and patient was extubated to expire shortly there after with the family bedside.  Pertinent Labs and Studies  Significant Diagnostic  Studies Ct Chest Wo Contrast  Result Date: 2018/04/20 CLINICAL DATA:  Respiratory distress EXAM: CT CHEST WITHOUT CONTRAST TECHNIQUE: Multidetector CT imaging of the chest was performed following the standard protocol without IV contrast. COMPARISON:  Chest radiograph dated 2018/04/20. PET-CT dated 12/24/2017. CT chest dated 10/23/2017. FINDINGS: Cardiovascular: The heart is normal in size. No pericardial effusion. No evidence of thoracic aortic aneurysm. Atherosclerotic calcifications of the aortic arch. Coronary atherosclerosis the LAD and left circumflex. Left chest port terminates in the lower SVC. Mediastinum/Nodes: No suspicious mediastinal lymphadenopathy. Visualized thyroid is unremarkable. Lungs/Pleura: Endotracheal tube terminates 3.8 cm above the carina. Ground-glass opacity in the bilateral upper and lower lobes in a perihilar distribution with coarsened interstitial markings. Associated traction bronchiectasis. This appearance favors chronic interstitial lung disease which is progressive. The appearance does not favor a UIP pattern/idiopathic pulmonary fibrosis but may reflect NSIP. Calcified pleural plaques in the anterior right hemithorax. No pleural effusion or pneumothorax. Upper Abdomen: Enteric tube courses into the proximal stomach. Musculoskeletal: Mild degenerative changes of the visualized thoracolumbar spine. IMPRESSION: Endotracheal tube terminates 3.8 cm above the carina. Scattered ground-glass opacity with coarse interstitial markings and bronchiectasis, favoring progressive chronic interstitial lung disease. Aortic Atherosclerosis (ICD10-I70.0). Electronically Signed   By: Julian Hy M.D.   On: Apr 20, 2018 16:59   Dg Chest Port 1 View  Result Date: 2018-04-23 CLINICAL DATA:  Pulmonary edema.  Endotracheal tube EXAM: PORTABLE CHEST 1 VIEW COMPARISON:  Yesterday FINDINGS: Endotracheal tube tip between the clavicular heads and carina. Swan from below with tip over the lobar  level left pulmonary arteries. Porta catheter on the left with tip at the lower SVC. Endotracheal tube tip  between the clavicular heads and carina. The catheter loop over the neck is presumably external to the patient. An orogastric tube reaches the stomach. Cardiomegaly and aortic tortuosity. There is patchy interstitial opacity bilaterally that is stable. IMPRESSION: 1. Stable positioning of tubes and lines. 2. Stable interstitial opacities. Reference chest CT from 3 days ago. Electronically Signed   By: Monte Fantasia M.D.   On: 2018-04-18 08:45   Dg Chest Port 1 View  Result Date: 03/23/2018 CLINICAL DATA:  ET tube EXAM: PORTABLE CHEST 1 VIEW COMPARISON:  03/22/2018 FINDINGS: Swan-Ganz catheter is in the left pulmonary artery, stable. Left Port-A-Cath tip in the upper right atrium, stable. Endotracheal tube and NG tube are unchanged. Cardiomegaly with vascular congestion. Stable interstitial thickening/prominence. No visible significant effusions. No change since prior study. IMPRESSION: No interval change. Electronically Signed   By: Rolm Baptise M.D.   On: 03/23/2018 07:44   Dg Chest Port 1 View  Result Date: 03/22/2018 CLINICAL DATA:  Acute respiratory failure. EXAM: PORTABLE CHEST 1 VIEW COMPARISON:  Chest radiograph March 21, 2018 and CT chest March 21, 2018 FINDINGS: Endotracheal tube tip projects 4.5 cm above the carina. Nasogastric tube side port projecting in proximal stomach, tip out of field-of-view. Single lumen LEFT chest Port-A-Cath distal tip in mid superior vena cava. The cardiac silhouette is mildly enlarged. Similar fullness of the hila corresponding to vascular structures on recent CT. Mildly calcified aortic arch. Stable faint nodular density LEFT upper lung zone without discrete nodule on yesterday's CT. Similar interstitial prominence without pleural effusion or focal consolidation. No pneumothorax. Soft tissue planes and included osseous structures are unchanged. IMPRESSION: No  apparent change in life-support lines. Stable interstitial changes.  Stable cardiomegaly. Aortic Atherosclerosis (ICD10-I70.0). Electronically Signed   By: Elon Alas M.D.   On: 03/22/2018 01:51   Dg Chest Portable 1 View  Result Date: 03/25/2018 CLINICAL DATA:  Hypoxia EXAM: PORTABLE CHEST 1 VIEW COMPARISON:  Chest radiograph and chest CT obtained earlier in the day FINDINGS: Endotracheal tube tip is 4.0 cm above the carina. Nasogastric tube tip and side port are below the diaphragm with side port seen in the stomach. Port-A-Cath tip in superior vena cava. No pneumothorax. There is hazy opacity in both mid lungs. Heart is mildly enlarged with pulmonary vascularity within normal limits. No adenopathy. No bone lesions. IMPRESSION: Hazy opacity in both mid lungs, similar to earlier in the day. Suspect a degree of underlying pneumonitis. Stable cardiac prominence. Tube and catheter positions as described without pneumothorax. Electronically Signed   By: Lowella Grip III M.D.   On: 03/26/2018 16:49   Dg Chest Portable 1 View  Result Date: 03/22/2018 CLINICAL DATA:  ETT and OG tube placement. EXAM: PORTABLE CHEST 1 VIEW COMPARISON:  Chest x-ray from same day at 10:45 a.m. FINDINGS: Interval placement of an endotracheal tube with the tip approximately 4.2 cm above the level of the carina. Enteric tube in place with the distal side port near the gastroesophageal junction. Unchanged left chest wall port catheter with the tip in the distal SVC. Stable cardiomegaly. Unchanged perihilar and left mid lung interstitial thickening. No pleural effusion or pneumothorax. No acute osseous abnormality. IMPRESSION: 1. Appropriately positioned endotracheal tube. 2. Enteric tube in place with the distal side port near the gastroesophageal junction. Recommend advancement 2-3 cm. 3. Unchanged perihilar and left mid lung interstitial opacities which may reflect edema or interstitial pneumonitis related to infectious or  inflammatory process. Electronically Signed   By: Orville Govern.D.  On: 03/31/2018 13:30   Dg Chest Port 1 View  Result Date: 03/08/2018 CLINICAL DATA:  Found unresponsive at home. History of cough. History of non-Hodgkin's lymphoma. EXAM: PORTABLE CHEST 1 VIEW COMPARISON:  Chest radiograph 10/05/2017 FINDINGS: Patient has a left jugular Port-A-Cath and the tip is in a stable position within the SVC. Mildly decreased lung volumes with increased interstitial densities in both lungs. Interstitial densities are most prominent in the left mid lung region. Cardiomediastinal silhouette is prominent but may be accentuated by AP technique. IMPRESSION: Interstitial densities in both lungs most prominent in the left lung. Findings are suggestive for interstitial edema. Cannot exclude an infectious or inflammatory process, particularly in the left lung. Question mild cardiomegaly. Electronically Signed   By: Markus Daft M.D.   On: 04/01/2018 11:08    Microbiology Recent Results (from the past 240 hour(s))  Blood Culture (routine x 2)     Status: Abnormal   Collection Time: 03/23/2018 10:50 AM  Result Value Ref Range Status   Specimen Description   Final    BLOOD LEFT ARM Performed at [facility], [place] [address], [place], [place] [address]    Special Requests   Final    Blood Culture adequate volume Performed at [facility] [address] [place], [place] [address]    Culture  Setup Time   Final    GRAM POSITIVE COCCI IN CLUSTERS IN BOTH AEROBIC AND ANAEROBIC BOTTLES Organism ID to follow CRITICAL RESULT CALLED TO, READ BACK BY AND VERIFIED WITH: Leonie Green PharmD 11:10 03/22/18 ([email or web address])    Culture (A)  Final    STAPHYLOCOCCUS SPECIES (COAGULASE NEGATIVE) THE SIGNIFICANCE OF ISOLATING THIS ORGANISM FROM A SINGLE SET OF BLOOD CULTURES WHEN MULTIPLE SETS ARE DRAWN IS UNCERTAIN. PLEASE NOTIFY THE MICROBIOLOGY DEPARTMENT WITHIN ONE WEEK IF SPECIATION AND  SENSITIVITIES ARE REQUIRED. Performed at [facility], [place] [address], [place], [place] [address]    Report Status 03-28-18 FINAL  Final  Blood Culture (routine x 2)     Status: None (Preliminary result)   Collection Time: 04/04/2018 10:50 AM  Result Value Ref Range Status   Specimen Description   Final    BLOOD RIGHT ARM Performed at [facility] [address], [place], [place] [address]    Special Requests   Final    Blood Culture adequate volume Performed at [facility] [address] [place], [place] [address]    Culture   Final    NO GROWTH 3 DAYS Performed at [facility], [place] [address], [place], [place] [address]    Report Status PENDING  Incomplete  Blood Culture ID Panel (Reflexed)     Status: Abnormal   Collection Time: 03/08/2018 10:50 AM  Result Value Ref Range Status   Enterococcus species NOT DETECTED NOT DETECTED Final   Listeria monocytogenes NOT DETECTED NOT DETECTED Final   Staphylococcus species DETECTED (A) NOT DETECTED Final    Comment: Methicillin (oxacillin) resistant coagulase negative staphylococcus. Possible blood culture contaminant (unless isolated from more than one blood culture draw or clinical case suggests pathogenicity). No antibiotic treatment is indicated for blood  culture contaminants. CRITICAL RESULT CALLED TO, READ BACK BY AND VERIFIED WITH: Leonie Green PharmD 11:10 03/22/18 ([email or web address])    Staphylococcus aureus NOT DETECTED NOT DETECTED Final   Methicillin resistance DETECTED (A) NOT DETECTED Final    Comment: CRITICAL RESULT CALLED TO, READ BACK BY AND VERIFIED WITH: Leonie Green PharmD 11:10  03/22/18 ([email or web address])    Streptococcus species NOT DETECTED NOT DETECTED Final   Streptococcus agalactiae NOT DETECTED NOT DETECTED Final   Streptococcus pneumoniae NOT DETECTED NOT DETECTED Final   Streptococcus pyogenes NOT DETECTED NOT DETECTED Final   Acinetobacter baumannii NOT DETECTED NOT DETECTED  Final   Enterobacteriaceae species NOT DETECTED NOT DETECTED Final   Enterobacter cloacae complex NOT DETECTED NOT DETECTED Final   Escherichia coli NOT DETECTED NOT DETECTED Final   Klebsiella oxytoca NOT DETECTED NOT DETECTED Final   Klebsiella pneumoniae NOT DETECTED NOT DETECTED Final   Proteus species NOT DETECTED NOT DETECTED Final   Serratia marcescens NOT DETECTED NOT DETECTED Final   Haemophilus influenzae NOT DETECTED NOT DETECTED Final   Neisseria meningitidis NOT DETECTED NOT DETECTED Final   Pseudomonas aeruginosa NOT DETECTED NOT DETECTED Final   Candida albicans NOT DETECTED NOT DETECTED Final   Candida glabrata NOT DETECTED NOT DETECTED Final   Candida krusei NOT DETECTED NOT DETECTED Final   Candida parapsilosis NOT DETECTED NOT DETECTED Final   Candida tropicalis NOT DETECTED NOT DETECTED Final    Comment: Performed at [facility], [place] [address] [place], [place] [address]  MRSA PCR Screening     Status: None   Collection Time: 04/03/2018  5:53 PM  Result Value Ref Range Status   MRSA by PCR NEGATIVE NEGATIVE Final    Comment:        The GeneXpert MRSA Assay (FDA approved for NASAL specimens only), is one component of a comprehensive MRSA colonization surveillance program. It is not intended to diagnose MRSA infection nor to guide or monitor treatment for MRSA infections. Performed at [facility], [place] [address] [place], [place] [address]   Culture, blood (routine x 2)     Status: None (Preliminary result)   Collection Time: 03/20/2018  6:22 PM  Result Value Ref Range Status   Specimen Description   Final    BLOOD LEFT ANTECUBITAL Performed at [facility] [address] [place], [place] [address]    Special Requests   Final    BOTTLES DRAWN AEROBIC ONLY Blood Culture adequate volume Performed at [facility] [address], [place], [place] [address]    Culture   Final    NO GROWTH 2  DAYS Performed at [facility] [address] [place], [place] [address]    Report Status PENDING  Incomplete  Culture, blood (routine x 2)     Status: None (Preliminary result)   Collection Time: 03/17/2018  6:39 PM  Result Value Ref Range Status   Specimen Description   Final    BLOOD RIGHT ANTECUBITAL Performed at [facility] [address] [place], [place] [address]    Special Requests   Final    BOTTLES DRAWN AEROBIC AND ANAEROBIC Blood Culture adequate volume Performed at [facility] [address] [place], [place] [address]    Culture   Final    NO GROWTH 2 DAYS Performed at [facility] [address], [place], [place] [address]    Report Status PENDING  Incomplete    Lab Basic Metabolic Panel: Recent Labs  Lab 03/30/2018 1050 03/25/2018 1822 03/22/2018 2258 03/22/18 0917 03/22/18 1558 03/23/18 0438 03/23/18 1650 09-Apr-2018 0430  NA 144 144  --  144  --  141  --  140  K 5.6* 5.0  --  4.5  --  3.8  --  4.0  CL 111 112*  --  110  --  108  --  105  CO2 18* 20*  --  24  --  25  --  25  GLUCOSE 130* 169*  --  115*  --  134*  --  165*  BUN 54* 59*  --  48*  --  38*  --  27*  CREATININE 1.64* 1.33* 1.22 1.19  --  1.06  --  0.87  CALCIUM 7.6* 7.8*  --  7.6*  --  7.3*  --  7.5*  MG  --   --   --  2.4 2.4 2.3 2.1  --   PHOS  --   --   --  4.7* 3.9 2.2* 3.3  --    Liver Function Tests: Recent Labs  Lab 03/14/2018 1050 03/20/2018 1822 03/23/18 0438  AST 617* 900* 215*  ALT 537* 599* 462*  ALKPHOS 83 90 79  BILITOT 1.8* 0.7 0.9  PROT 6.3* 6.8 5.0*  ALBUMIN 2.5* 2.8* 2.0*   No results for input(s): LIPASE, AMYLASE in the last 168 hours. No results for input(s): AMMONIA in the last 168 hours. CBC: Recent Labs  Lab 03/18/2018 1050 03/26/2018 1822 03/20/2018 2258 03/22/18 0917 03/23/18 0438 03/23/18 1015 04-19-2018 0430 Apr 19, 2018 1137  WBC 6.7 8.3 7.5 5.6 6.3  --  6.4 6.9  NEUTROABS 5.3 6.0  --   --   --   --   --   --    HGB 10.8* 11.2* 10.3* 10.1* 8.9*  --  8.8* 9.0*  HCT 33.2* 35.0* 32.7* 32.5* 28.7*  --  27.9* 29.0*  MCV 97.4 98.9 98.2 100.9* 99.3  --  100.4* 99.0  PLT 88* 82* 70* 48* 46* 40* 33* 35*   Cardiac Enzymes: Recent Labs  Lab 03/15/2018 1050 04/03/2018 1822  TROPONINI 5.23* 10.42*   Sepsis Labs: Recent Labs  Lab 03/14/2018 1053 04/04/2018 1356 03/17/2018 1822 03/30/2018 1904  03/22/18 0917 03/23/18 0438 2018-04-19 0430 Apr 19, 2018 1137  PROCALCITON  --   --  2.12  --   --   --  1.41  --   --   WBC  --   --  8.3  --    < > 5.6 6.3 6.4 6.9  LATICACIDVEN 4.82* 3.24*  --  1.9  --   --   --   --   --    < > = values in this interval not displayed.    Procedures/Operations     Pennye Beeghly 03/25/2018, 1:09 PM

## 2018-04-07 NOTE — Progress Notes (Signed)
Patient in asystole, verified no heartbeat upon ausculation at 1621.  Absence of heartbeat and respiratory effort confirmed with Penny Pia RN.   Fentanyl and Versed stopped, family remains at bedside comfort given to family. Dr. Nelda Marseille aware

## 2018-04-07 NOTE — Progress Notes (Signed)
I met with the entire family.  After discussion and prognostication, it was decided that patient would not want that level of care and would not want to undergo additional procedures.  Based on discussion, will proceed with full comfort and extubate when family is ready.  Continue with fentanyl and versed for now.  Increase and extubate when family is ready, RN aware that orders are under signed and held.  The patient is critically ill with multiple organ systems failure and requires high complexity decision making for assessment and support, frequent evaluation and titration of therapies, application of advanced monitoring technologies and extensive interpretation of multiple databases.   Critical Care Time devoted to patient care services described in this note is  45  Minutes. This time reflects time of care of this signee Dr Jennet Maduro. This critical care time does not reflect procedure time, or teaching time or supervisory time of PA/NP/Med student/Med Resident etc but could involve care discussion time.  Rush Farmer, M.D. [facility] Pulmonary/Critical Care Medicine. Pager: [phone]. After hours pager: [phone].

## 2018-04-07 NOTE — Plan of Care (Signed)
Pt is currently maintaining map > 65 w/ vasopressor support. Pt is currently diuresing greater than 30 cc and is being turned Q2 to prevent further breakdown of skin.

## 2018-04-07 NOTE — Progress Notes (Signed)
Pt called to room by RN due to low Spo2. Upon arrival Spo2 84%. Increased PEEP from +14 to +18 and attempted recruitment maneuver. Pt Spo2 fell to 75% during recruitment, therefore recruitment terminated. Richardson Landry Minor, NP arrived in room and asked for ABG and order to increased PEEP to +20. ABG obtained with Spo2 on monitor reading 81% and SAT on ABG 83%. ABG in 1 hour per Richardson Landry Minor, NP.

## 2018-04-07 NOTE — Progress Notes (Signed)
Patient made comfort care per family request. Levophed, Milirone discontinued as well as tube feeds.  Fentanyl and Versed remain infusing, plan to bolus as needed for patent comfort.  Family at bedside and in agreeance with plan.  Family pastor at bedside

## 2018-04-07 NOTE — Procedures (Signed)
Extubation Procedure Note  Patient Details:   Name: Todd Bell DOB: January 19, 1946 MRN: [id number]    Evaluation  O2 sats: currently acceptable Complications: No apparent complications Patient did tolerate procedure well. Bilateral Breath Sounds: Diminished, Rhonchi   Pt extubated per Withdrawal of life sustaining measures.   Irineo Axon [facility] 2018/04/06, 4:18 PM

## 2018-04-07 NOTE — Progress Notes (Signed)
Cancel echo per Dr.Yacoub.

## 2018-04-07 NOTE — Progress Notes (Addendum)
PULMONARY / CRITICAL CARE MEDICINE   Name: Todd Bell MRN: [id number] DOB: [birth year]    ADMISSION DATE:  03/28/2018 CONSULTATION DATE: 04/04/2018  REFERRING MD: [facility]  CHIEF COMPLAINT: Shock and respiratory failure.  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  His wife reports a 5-day history of dyspnea without chest pain possibly associated with orthopnea. He appeared diaphoretic and clammy.  He was then found unresponsive.  Saturations were in the 60s. He was intubated and treatment was started for a presumptive diagnosis of community-acquired pneumonia.  Upon returning from a CT scan of the chest he became increasingly hypotensive and hypoxic.  SUBJECTIVE:  Worsening oxygenation.  Attempting diuresis.   VITAL SIGNS: BP 102/70   Pulse (!) 129   Temp (!) 100.8 F (38.2 C)   Resp (!) 23   Ht 6' (1.829 m)   Wt 81.2 kg (179 lb 0.2 oz)   SpO2 (!) 85%   BMI 24.28 kg/m   HEMODYNAMICS: PAP: (34-55)/(13-38) 55/38 CVP:  [1 mmHg-18 mmHg] 18 mmHg PCWP:  [19 mmHg] 19 mmHg CO:  [4.8 L/min-5.5 L/min] 4.8 L/min CI:  [2.5 L/min/m2-2.9 L/min/m2] 2.5 L/min/m2On norepinephrine 12 mcg/min  VENTILATOR SETTINGS: Vent Mode: PCV FiO2 (%):  [40 %-100 %] 100 % Set Rate:  [25 bmp] 25 bmp PEEP:  [10 cmH20-20 cmH20] 20 cmH20 Plateau Pressure:  [13 cmH20-25 cmH20] 13 cmH20  INTAKE / OUTPUT: I/O last 3 completed shifts: In: 5928.4 [I.V.:2346.4; BZ/JI:9678.9; IV Piggyback:1133.3] Out: 1665 [Urine:1665]  PHYSICAL EXAMINATION:  General: Frail elderly male sedated on vent HEENT: Endotracheal tube to ventilator, OG tube with tube feedings PSY: Sedated Neuro: Does not follow commands when sedation is lightened CV: Sinus tach heart sounds are regular PULM: Coarse rhonchi  bilaterally, left Port-A-Cath in place FY:BOFB, non-tender, bsx4 active  Extremities: warm/dry, 1+ edema  Skin: no rashes or lesions   LABS:  BMET Recent Labs  Lab 03/22/18 0917 03/23/18 0438 2018-04-05 0430  NA 144 141 140  K 4.5 3.8 4.0  CL 110 108 105  CO2 24 25 25   BUN 48* 38* 27*  CREATININE 1.19 1.06 0.87  GLUCOSE 115* 134* 165*   Electrolytes Recent Labs  Lab 03/22/18 0917 03/22/18 1558 03/23/18 0438 03/23/18 1650 04/05/2018 0430  CALCIUM 7.6*  --  7.3*  --  7.5*  MG 2.4 2.4 2.3 2.1  --   PHOS 4.7* 3.9 2.2* 3.3  --    CBC Recent Labs  Lab 03/22/18 0917 03/23/18 0438 03/23/18 1015 04/05/18 0430  WBC 5.6 6.3  --  6.4  HGB 10.1* 8.9*  --  8.8*  HCT 32.5* 28.7*  --  27.9*  PLT 48* 46* 40* 33*   Coag's Recent Labs  Lab 03/27/2018 1822 03/23/18 0438 03/23/18 1015  APTT 33  --  41*  INR 1.43 1.38 1.40   Sepsis Markers Recent Labs  Lab 03/28/2018 1053 04/01/2018 1356 03/23/2018 1822 03/12/2018 1904 03/23/18 0438  LATICACIDVEN 4.82* 3.24*  --  1.9  --   PROCALCITON  --   --  2.12  --  1.41   ABG Recent Labs  Lab Apr 05, 2018 0355 04-05-18 0851 04/05/18 0950  PHART 7.374 7.369 7.348*  PCO2ART 46.4 41.9 48.1*  PO2ART 64.5* 65.0* 54.0*   Liver Enzymes Recent Labs  Lab 03/08/2018 1050  04/04/2018 1822 03/23/18 0438  AST 617* 900* 215*  ALT 537* 599* 462*  ALKPHOS 83 90 79  BILITOT 1.8* 0.7 0.9  ALBUMIN 2.5* 2.8* 2.0*   Cardiac Enzymes Recent Labs  Lab 04/04/2018 1050 03/27/2018 1822  TROPONINI 5.23* 10.42*   Glucose Recent Labs  Lab 03/23/18 1151 03/23/18 1534 03/23/18 2018 03/23/18 2322 03/26/18 0355 2018-03-26 0921  GLUCAP 127* 113* 128* 150* 169* 144*    Imaging Dg Chest Port 1 View  Result Date: March 26, 2018 CLINICAL DATA:  Pulmonary edema.  Endotracheal tube EXAM: PORTABLE CHEST 1 VIEW COMPARISON:  Yesterday FINDINGS: Endotracheal tube tip between the clavicular heads and carina. Swan from below with tip over the lobar level left pulmonary  arteries. Porta catheter on the left with tip at the lower SVC. Endotracheal tube tip between the clavicular heads and carina. The catheter loop over the neck is presumably external to the patient. An orogastric tube reaches the stomach. Cardiomegaly and aortic tortuosity. There is patchy interstitial opacity bilaterally that is stable. IMPRESSION: 1. Stable positioning of tubes and lines. 2. Stable interstitial opacities. Reference chest CT from 3 days ago. Electronically Signed   By: Monte Fantasia M.D.   On: 03/26/2018 08:45     STUDIES:  Serial EKGs show dynamic anterior T wave inversions most prominently in leads V1 through V4.  Some normalization has occurred with improved blood pressure.  CULTURES: Blood cultures Coag neg staph. ? Contaminant.   ANTIBIOTICS: Currently on vancomycin and Zosyn   LINES/TUBES: Foley catheter, 7.5 mm endotracheal tube, right radial arterial line, pressors infusing through left subclavian Port-A-Cath.  DISCUSSION: Severe shock initially thought to be due to to sepsis from pneumonia.  However, sepsis screen score was low and chest imaging more consistent with interstitial edema. Furthermore, patient had a significantly elevated troponin, new and dynamic anterior T wave inversions and a focused echocardiogram showing septal akinesis consistent with infarction in the area seen on the EKGs. He went to cath lab, which demonstrated clean coronaries. Echo consistent with Takotsubo cardiomyopathy. Course complicated by shock and hypoxia requiring high support mechanical ventilation and pressors. Remained refractory. Milrinone and inhaled nitric added 4/15 and he responded to these therapies.  03/26/2018 acquiring increasing FiO2 and PEEP to maintain sub-adequate oxygenation.  We are attempting to diurese.  I have attempted to have a end-of-life discussion with daughter will reiterate this with the wife when she appears I suspect he will code within the next 24-48 hours  if he does not respond to Lasix.  ASSESSMENT / PLAN:  PULMONARY A: Acute hypoxic respiratory failure: due to pulmonary edema in the setting of acute heart failure/ Takotsubo P:   Currently on pressure control ventilation 18/18 with a peak story pressure of 37 and plateau pressure 37 Requiring 100% FiO2 and 20 of PEEP to maintain a PCO2 of greater than 50 Attempting diuresis Mar 26, 2018 Question of CVVH would help I suspect that he is not going to survive this episode. 03-26-2018 failed recruitment maneuver His chest x-ray does not match his O2 mismatch.  CARDIOVASCULAR A:  Cardiogenic shock, initially cold and wet hemodynamic profile.  Perfusion has improved with initiation of norepinephrine.  Leading dx in differential at this point is [place]. Clean cath.   P:  Pressors for cardiology Lasix added 03-26-2018 2018/03/26 milrinone increased.  RENAL Lab Results  Component Value Date   CREATININE 0.87 03/26/2018   CREATININE 1.06 03/23/2018   CREATININE 1.19 03/22/2018   CREATININE 0.9 11/27/2017   CREATININE 0.9 11/06/2017  CREATININE 0.8 10/23/2017   Recent Labs  Lab 03/22/18 0917 03/23/18 0438 04-08-18 0430  K 4.5 3.8 4.0    A:   Evolving acute kidney injury initially.  Urine output has improved with restoration of normal blood pressure. P:   Strict I and O 2018-04-08 Lasix 40 mg IV x1   GASTROINTESTINAL A:   No active abdominal process.  Appears adequately nourished. LFTs elevated, ?shocked liver P:   Tube feeding as tolerated  HEMATOLOGIC Recent Labs    03/23/18 0438 04-08-2018 0430  HGB 8.9* 8.8*    A:   Anemia of acute illness as well as hematologic malignancy.   Thrombocytopenia, possibly due to acute marrow suppression from acute illness. HIT? DIC? Doubt.  B-cell lymphoma.  P:  Transfuse per protocol History of non-Hodgkin's leukemia with chemoradiation last treatment in February 06, 2018 D-dimer greater than 20, fibrinogen 366 INR is  1.4  INFECTIOUS A:   Shock state which on further examination does not appear to be infectious in origin. P:    Due to the severity of his illness will not streamline his antibiotics at this time.  ENDOCRINE CBG (last 3)  Recent Labs    03/23/18 2322 04/08/2018 0355 2018-04-08 0921  GLUCAP 150* 169* 144*    A:   Acceptable glycemic control P:   Sliding scale insulin  NEUROLOGIC A:   Neurologically normal at baseline.  Currently sedated to ensure comfort and ventilator compliance. P:   RASS goal: -4 Versed and fentanyl drip Apr 08, 2018 he is off Nimbex may need to restart if he becomes dyssynchronous with the ventilator.  FAMILY  - Updates: Daughter updated at bedside on 2018-04-08, confirmed he remains a full code at this time although the severity of his illness was reiterated to her.   Summary: 72 year old who is now 100% FiO2 20 of PEEP and is hypoxic with a PO2 less than 60.  Were attempting to diurese him with Lasix we will follow-up arterial blood gas.   App cct 60 min.  Richardson Landry Minor ACNP Maryanna Shape PCCM Pager [phone] till 1 pm If no answer page [phone] 2018-04-08, 10:07 AM  Attending Note:  72 year old male with CHF and failure to oxygenate.  Patient has deteriorated overnight and now is on 100% FiO2 and PEEP of 20.  On exam, lungs are clear.  I reviewed CXR myself, ETT is in good position and lungs are surprisingly normal.  Had an extensive conversation with the family.  Explained that patient is not progressing well and without very aggressive interventions then chances of survival are minuscule at best.  But with aggressive interventions including placing PAC and restarting nitric oxide and possibly dialysis there maybe a chance of survival.  I asked if they would want to take that chance and family informed me that he did not even want the ventilator in the first place.  I informed them that we can perform above procedure and left them make decision but  recommended against aggressive interventions.  The patient is critically ill with multiple organ systems failure and requires high complexity decision making for assessment and support, frequent evaluation and titration of therapies, application of advanced monitoring technologies and extensive interpretation of multiple databases.   Critical Care Time devoted to patient care services described in this note is  60  Minutes. This time reflects time of care of this signee Dr Jennet Maduro. This critical care time does not reflect procedure time, or teaching time or supervisory time of  PA/NP/Med student/Med Resident etc but could involve care discussion time.  Rush Farmer, M.D. [facility] Pulmonary/Critical Care Medicine. Pager: [phone]. After hours pager: [phone].

## 2018-04-07 NOTE — Progress Notes (Signed)
Wasted 220cc of Fentanyl in sink witnessed by Evelina Dun RN, wasted 30cc of Versed in sink witnessed by Evelina Dun RN patient expired.

## 2018-04-07 NOTE — Progress Notes (Signed)
Progress Note  Patient Name: Todd Bell Date of Encounter: 04-16-2018  Primary Cardiologist: No primary care provider on file.   Subjective   Unable to obtain.  Intubated and sedated.  Inpatient Medications    Scheduled Meds: . chlorhexidine gluconate (MEDLINE KIT)  15 mL Mouth Rinse BID  . Chlorhexidine Gluconate Cloth  6 each Topical Daily  . fentaNYL (SUBLIMAZE) injection  50 mcg Intravenous Once  . furosemide  40 mg Intravenous Once  . mouth rinse  15 mL Mouth Rinse 10 times per day  . sodium chloride flush  10-40 mL Intracatheter Q12H  . sodium chloride flush  3 mL Intravenous Q12H   Continuous Infusions: . sodium chloride    . sodium chloride    . famotidine (PEPCID) IV Stopped (03/23/18 2329)  . feeding supplement (VITAL AF 1.2 CAL) 65 mL/hr at 04/16/2018 0700  . fentaNYL infusion INTRAVENOUS 125 mcg/hr (04/16/18 0700)  . midazolam (VERSED) infusion 2 mg/hr (Apr 16, 2018 0700)  . milrinone    . norepinephrine (LEVOPHED) Adult infusion 40 mcg/min (Apr 16, 2018 0700)  . piperacillin-tazobactam (ZOSYN)  IV 3.375 g (Apr 16, 2018 0438)  . vancomycin Stopped (16-Apr-2018 0152)   PRN Meds: sodium chloride, sodium chloride, acetaminophen (TYLENOL) oral liquid 160 mg/5 mL, bisacodyl, fentaNYL, fentaNYL (SUBLIMAZE) injection, midazolam, midazolam, sennosides, sodium chloride flush, sodium chloride flush   Vital Signs    Vitals:   04-16-2018 0815 2018-04-16 0830 Apr 16, 2018 0845 04/16/2018 0900  BP:  95/72    Pulse: (!) 131 (!) 133 (!) 129 (!) 129  Resp: (!) 24 (!) 22 (!) 25 (!) 26  Temp: (!) 100.8 F (38.2 C) (!) 100.8 F (38.2 C) (!) 100.8 F (38.2 C) (!) 100.9 F (38.3 C)  TempSrc:      SpO2: (!) 89% (!) 86% (!) 83% (!) 89%  Weight:      Height:        Intake/Output Summary (Last 24 hours) at 04-16-2018 0910 Last data filed at Apr 16, 2018 0745 Gross per 24 hour  Intake 3442.33 ml  Output 990 ml  Net 2452.33 ml   Filed Weights   03/22/18 0452 03/23/18 0431 16-Apr-2018 0447    Weight: 157 lb 3 oz (71.3 kg) 174 lb 2.6 oz (79 kg) 179 lb 0.2 oz (81.2 kg)    Telemetry    Sinus tachycardia.  Runs of atrial fibrillation vs. SVT.   - Personally Reviewed  ECG    Sinus tachycardia.  Rate 122 bpm.  Anterior ST depression. - Personally Reviewed  Physical Exam   VS:  BP 95/72   Pulse (!) 129   Temp (!) 100.9 F (38.3 C)   Resp (!) 26   Ht 6' (1.829 m)   Wt 179 lb 0.2 oz (81.2 kg)   SpO2 (!) 89%   BMI 24.28 kg/m  , BMI Body mass index is 24.28 kg/m. GENERAL:  Critically ill-appearing.  Intubated.  HEENT: Pupils equal round and reactive, fundi not visualized, oral mucosa unremarkable.  Blood in OG tube.  NECK:  Unable to assess jugular venous distention, waveform within normal limits, carotid upstroke brisk and symmetric, no bruits LUNGS:  Clear to auscultation bilaterally on anterior exam HEART:  Tachycardic.  Regular rhythm.  PMI not displaced or sustained,S1 and S2 within normal limits, no S3, no S4, no clicks, no rubs, no murmurs ABD:  Flat, positive bowel sounds normal in frequency in pitch, no bruits, no rebound, no guarding, no midline pulsatile mass, no hepatomegaly, no splenomegaly EXT:  2 plus pulses throughout. LE  warm.  +UE edema, no cyanosis no clubbing SKIN:  No rashes no nodules NEURO:  Unable to assess PSYCH:  Unable to assess.    Labs    Chemistry Recent Labs  Lab 03/30/2018 1050 03/19/2018 1822  03/22/18 0917 03/23/18 0438 2018/04/07 0430  NA 144 144  --  144 141 140  K 5.6* 5.0  --  4.5 3.8 4.0  CL 111 112*  --  110 108 105  CO2 18* 20*  --  24 25 25   GLUCOSE 130* 169*  --  115* 134* 165*  BUN 54* 59*  --  48* 38* 27*  CREATININE 1.64* 1.33*   < > 1.19 1.06 0.87  CALCIUM 7.6* 7.8*  --  7.6* 7.3* 7.5*  PROT 6.3* 6.8  --   --  5.0*  --   ALBUMIN 2.5* 2.8*  --   --  2.0*  --   AST 617* 900*  --   --  215*  --   ALT 537* 599*  --   --  462*  --   ALKPHOS 83 90  --   --  79  --   BILITOT 1.8* 0.7  --   --  0.9  --   GFRNONAA 40* 51*    < > 58* >60 >60  GFRAA 46* 59*   < > >60 >60 >60  ANIONGAP 15 12  --  10 8 10    < > = values in this interval not displayed.     Hematology Recent Labs  Lab 03/22/18 0917 03/23/18 0438 03/23/18 1015 2018/04/07 0430  WBC 5.6 6.3  --  6.4  RBC 3.22* 2.89*  --  2.78*  HGB 10.1* 8.9*  --  8.8*  HCT 32.5* 28.7*  --  27.9*  MCV 100.9* 99.3  --  100.4*  MCH 31.4 30.8  --  31.7  MCHC 31.1 31.0  --  31.5  RDW 16.8* 16.2*  --  16.2*  PLT 48* 46* 40* 33*    Cardiac Enzymes Recent Labs  Lab 03/20/2018 1050 04/01/2018 1822  TROPONINI 5.23* 10.42*   No results for input(s): TROPIPOC in the last 168 hours.   BNP Recent Labs  Lab 03/25/2018 1050  BNP 621.7*     DDimer  Recent Labs  Lab 03/13/2018 1822 03/23/18 1015  DDIMER >20.00* >20.00*     Radiology    Dg Chest Port 1 View  Result Date: 04-07-2018 CLINICAL DATA:  Pulmonary edema.  Endotracheal tube EXAM: PORTABLE CHEST 1 VIEW COMPARISON:  Yesterday FINDINGS: Endotracheal tube tip between the clavicular heads and carina. Swan from below with tip over the lobar level left pulmonary arteries. Porta catheter on the left with tip at the lower SVC. Endotracheal tube tip between the clavicular heads and carina. The catheter loop over the neck is presumably external to the patient. An orogastric tube reaches the stomach. Cardiomegaly and aortic tortuosity. There is patchy interstitial opacity bilaterally that is stable. IMPRESSION: 1. Stable positioning of tubes and lines. 2. Stable interstitial opacities. Reference chest CT from 3 days ago. Electronically Signed   By: Monte Fantasia M.D.   On: Apr 07, 2018 08:45   Dg Chest Port 1 View  Result Date: 03/23/2018 CLINICAL DATA:  ET tube EXAM: PORTABLE CHEST 1 VIEW COMPARISON:  03/22/2018 FINDINGS: Swan-Ganz catheter is in the left pulmonary artery, stable. Left Port-A-Cath tip in the upper right atrium, stable. Endotracheal tube and NG tube are unchanged. Cardiomegaly with vascular congestion.  Stable interstitial thickening/prominence. No  visible significant effusions. No change since prior study. IMPRESSION: No interval change. Electronically Signed   By: Rolm Baptise M.D.   On: 03/23/2018 07:44    Cardiac Studies   LHC/RHC 03/12/2018:  Angiographically normal coronary arteries with no notable disease.  There is moderate to severe left ventricular systolic dysfunction. EF estimated at roughly 25-35%. -In a Takotsubo pattern  LV end diastolic pressure is normal.  Relatively normal right heart cath pressures.   ASSESSMENT:  Angiographically normal coronary arteries with no obstructive disease to explain elevated troponins.  Grossly abnormal Left Ventriculogram Suggesting Takotsubo Cardiomyopathy  Mildly reduced Cardiac Output/Index with Essentially Normal Right Heart Pressures.  Arterial and venous blood gases have been uploaded to the epic system  RA 8 PA 35/6, 25 RV 37/3, 11 PCWP 11  Echo 03/23/18: Study Conclusions  - Left ventricle: Poor acoustical windows preclude accurated   assessment of EF and wall motion. Recommend defininty study to   evaluate further. The cavity size was normal. There was an   increased relative contribution of atrial contraction to   ventricular filling. Doppler parameters are consistent with   abnormal left ventricular relaxation (grade 1 diastolic   dysfunction). - Aortic valve: There was mild regurgitation. - Right ventricle: The cavity size was moderately dilated. Wall   thickness was normal. - Pulmonary arteries: PA peak pressure: 45 mm Hg (S).  Impressions:  - The right ventricular systolic pressure was increased consistent   with moderate pulmonary hypertension.  Recommendations:  Poor acoustical windows preclude accurated assessment of EF and wall motion. Recommend defininty study to evaluate further.  Patient Profile     72 y.o. male with B cell lymphoma with partial response to R CHOP found unresponsive,  hypotensive and hypoxic at home on 03/14/2018.  Assessment & Plan    # Takotsubo cardiomyopathy:  # Cardiogenic shock: Normal coronary arteries on LHC and diffuse mid-apical hypokinesis.  Echocardiogram had poor acoustic windows.  Will repeat with Definity contrast today. CO is worse this AM.  He is tachycardic and CVP is 13.  Oxygenation is worsening on the vent.  LVEDP is elevated as well.  We will give a trial of lasix.  Despite his fevers he is presenting more as cardiogenic shock.  Increase milrinone to 0.25.  We will try to wean norepinephrine.  He also received albumin overnight due to concern that his presentation with sepsis.  Repeat co-ox at 12 pm.   # B cell lymphoma: Partially responsive to R CHOP.  Was awaiting staging PET CT.  Completed chemotherapy 01/2018.  Family appreciates visits from his oncologist.   # Hypoxic respiratory failure:  # Respiratory acidosis:  Increasing FiO2 requirement.  Lasix as above.   # Anemia/Thrombocytopenia: No DIC.  Fibrinogen was within normal limits and no schistocytes seen on smear.  Bleeding noted in OG tube.  Will repeat h/h at noon.  Family (wife, 2 daughers) was updated at the bedside.    Time spent: 45 minutes-Greater than 50% of this time was spent in counseling, explanation of diagnosis, planning of further management, and coordination of care.   For questions or updates, please contact [place] Please consult [email or web address] for contact info under Cardiology/STEMI.      Signed, Skeet Latch, MD  April 05, 2018, 9:10 AM

## 2018-04-07 NOTE — Progress Notes (Signed)
Visited with patient at death of this patient.  Daughters and patient's Spouse and their Pastor at bedside.  Shared a few stories and left them in the hands of their loving pastor.  Thankful for medical team caring for this patient and their family.  They have nothing but gleaming praise reports for the [place] staff.    04/17/18 1641  Clinical Encounter Type  Visited With Patient and family together;Health care provider  Visit Type Spiritual support;Follow-up;Death

## 2018-04-07 DEATH — deceased

## 2018-04-26 ENCOUNTER — Ambulatory Visit: Payer: Medicare Other

## 2018-04-26 ENCOUNTER — Ambulatory Visit: Payer: Medicare Other | Admitting: Hematology and Oncology

## 2018-04-26 ENCOUNTER — Other Ambulatory Visit: Payer: Medicare Other

## 2019-01-28 IMAGING — PT NM PET TUM IMG INITIAL (PI) SKULL BASE T - THIGH
9 series · 25 of 25 positions shown · non-contrast
Comparison: CT scan dated 06/15/2017

CLINICAL DATA: Initial treatment strategy for right supraclavicular
mass.

EXAM:
NUCLEAR MEDICINE PET SKULL BASE TO THIGH
TECHNIQUE: 8.0 mCi F-18 FDG was injected intravenously. Full-ring PET imaging
was performed from the skull base to thigh after the radiotracer. CT
data was obtained and used for attenuation correction and anatomic
localization.
FASTING BLOOD GLUCOSE:  Value: 108 mg/dl

[Series 3: pet hn_sk_thigh ac · axial · 5.0mm · 4.07mm/px · z∈[-310,+634]mm · 5 of 237 slices shown]
[im 1/237]
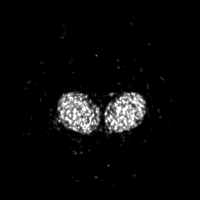
[im 60/237]
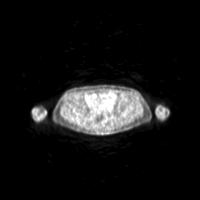
[im 119/237]
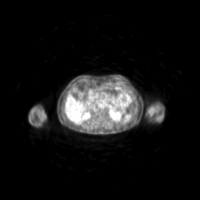
[im 178/237]
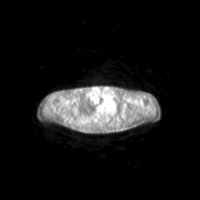
[im 237/237]
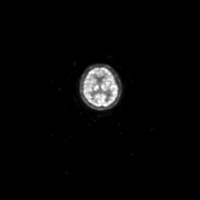

[Series 4: ct hn_sk_th 5.0 b31f · axial · 5.0mm · 0.98mm/px · z∈[-310,+634]mm · 5 of 237 slices shown]
[im 1/237]
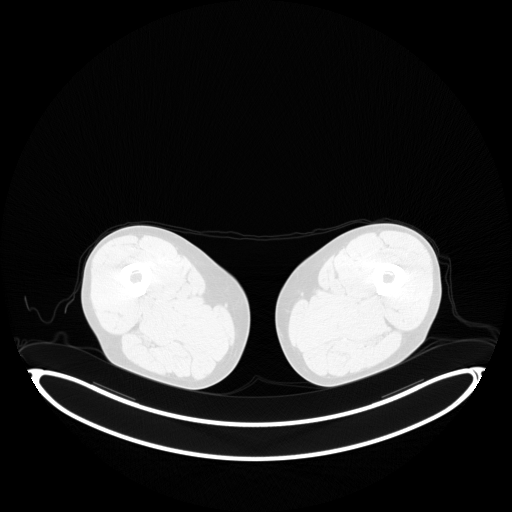
[im 60/237]
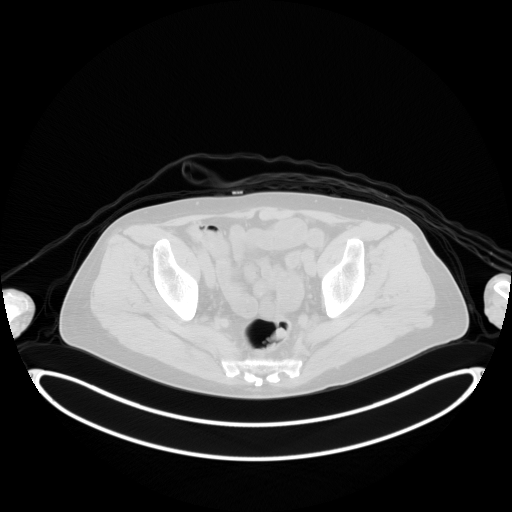
[im 119/237]
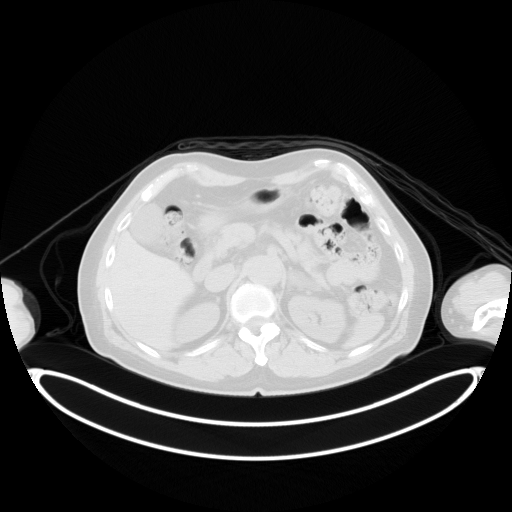
[im 178/237]
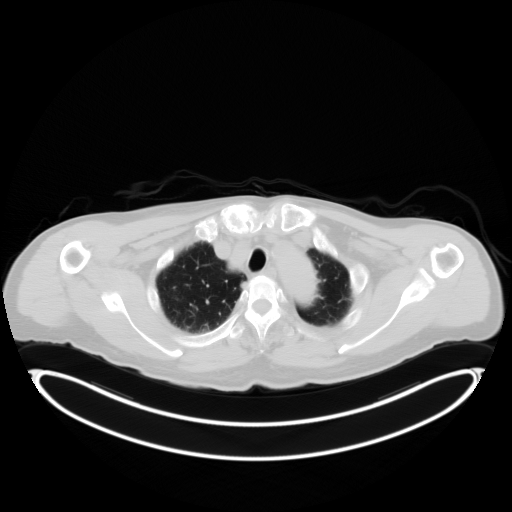
[im 237/237  brain]
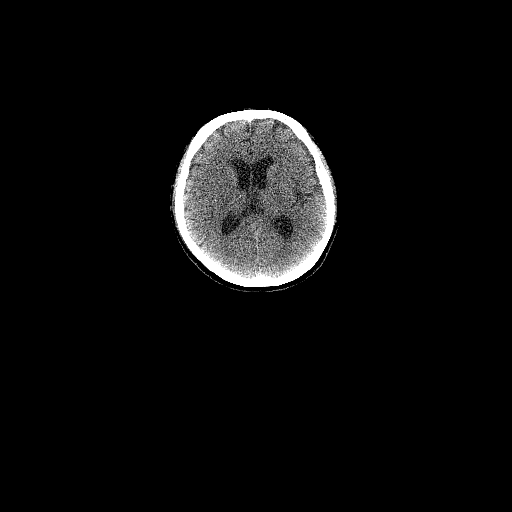

[Series 7: pet hn_sk_thigh nac · axial · 5.0mm · 4.07mm/px · z∈[-310,+634]mm · 5 of 237 slices shown]
[im 1/237]
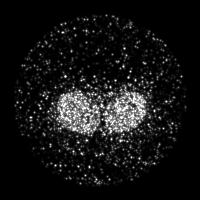
[im 60/237]
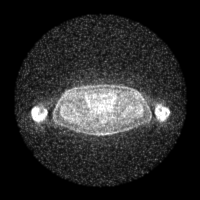
[im 119/237]
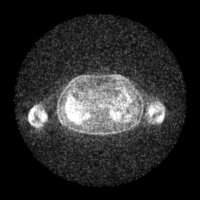
[im 178/237]
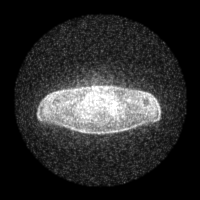
[im 237/237]
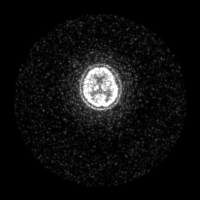

[Series 8: ct hn_sk_th 5.0 b70f lung_bone · axial · 5.0mm · 0.63mm/px · 1 of 63 slices shown]
[im 1/63  bone]
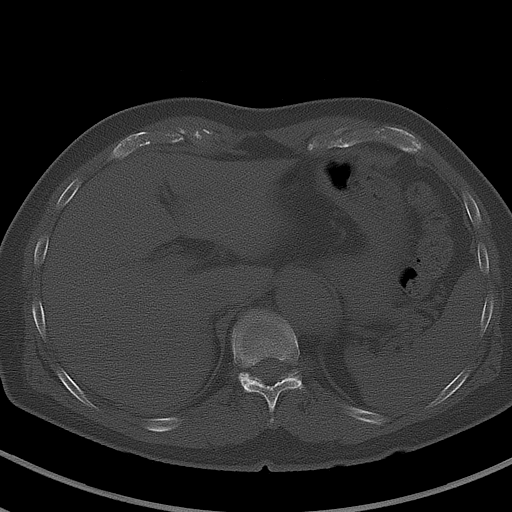

[Series 604: range-ct hn_sk_th 5.0 (id)<alpha range> · 1 of 61 slices shown (1 of 2)]
[im 1/61]
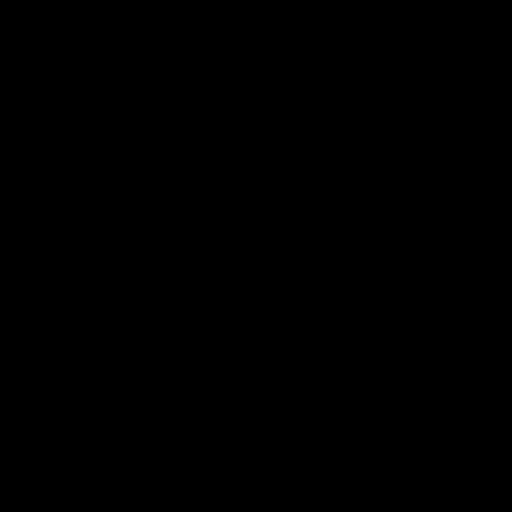

[Series 605: mip collection · coronal · 1.96mm/px · 1 of 32 slices shown]
[im 1/32]
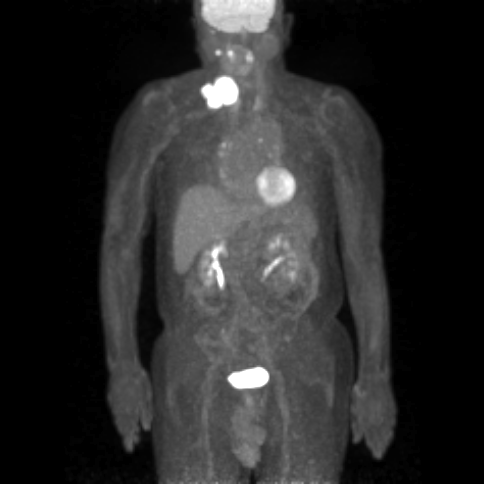

[Series 606: range-ct hn_sk_th 5.0 (id)<alpha range> · 5 of 226 slices shown (2 of 2)]
[im 1/226]
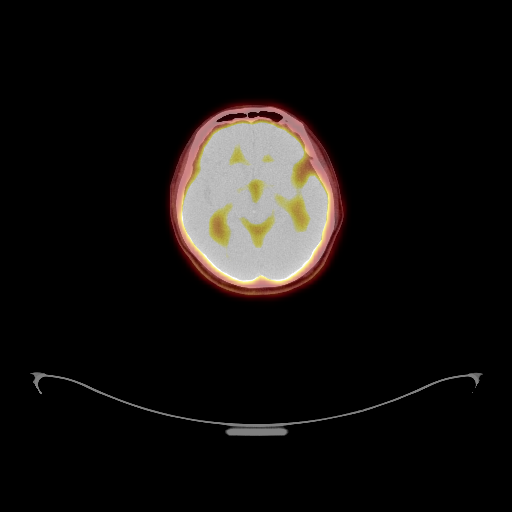
[im 57/226]
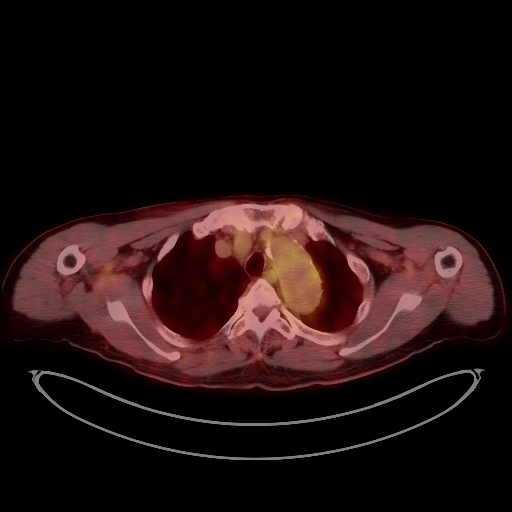
[im 113/226]
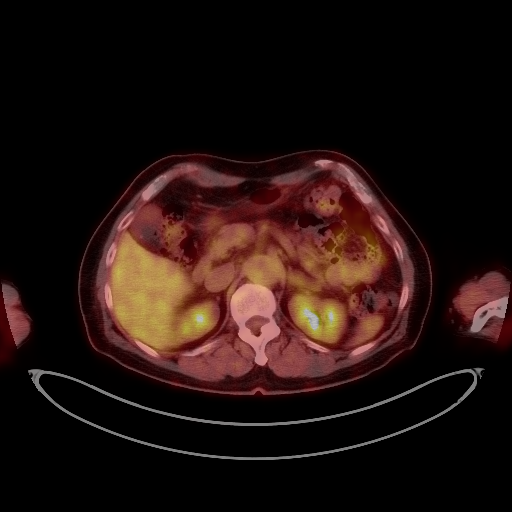
[im 169/226]
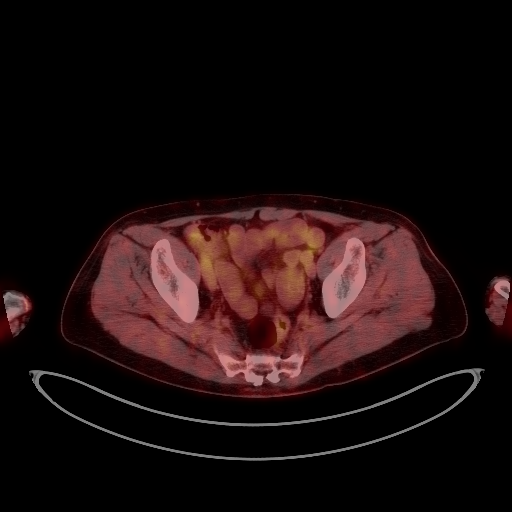
[im 226/226]
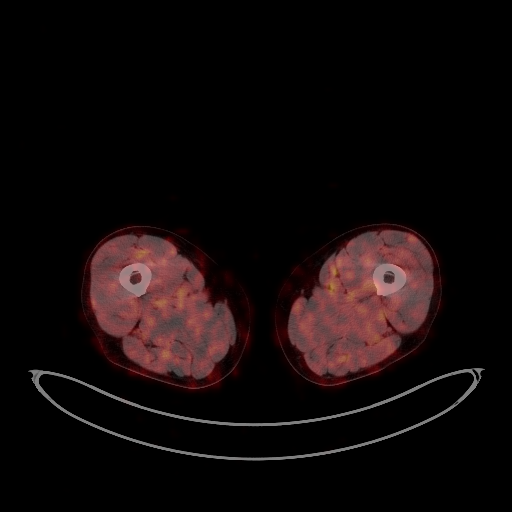

[Series 1032: results mm oncology reading · 5.0mm · 0.70mm/px · 1 of 5 slices shown (1 of 2)]
[im 1/5]
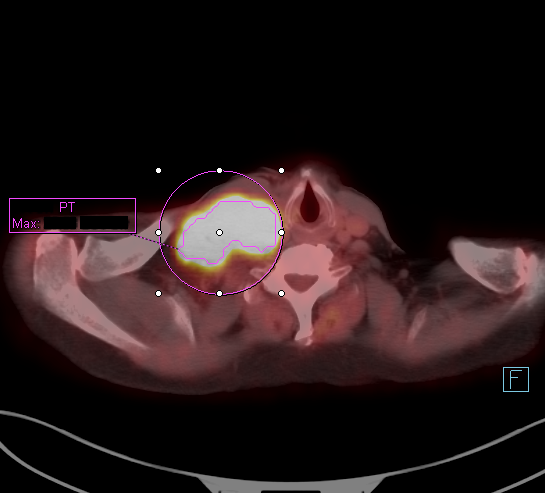

[Series 1105: results mm oncology reading · 1.00mm/px · 1 of 1 slices shown (2 of 2)]
[im 1/1]
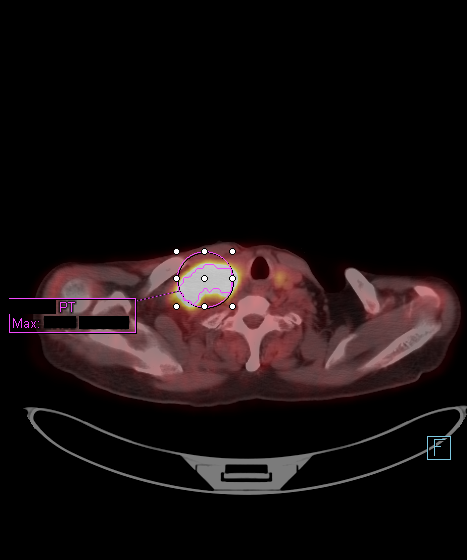

[25 of 25 positions shown; findings below may reference images not displayed]

FINDINGS: NECK

Dominant 7.6 by 4.0 cm right supraclavicular mass, maximum SUV 29.5.

A 0.8 cm right buccinator node has a maximum SUV of 10.1, compatible
with malignancy. This abuts but does not appear to be definitively
arising from the right prostate lobe. Smaller lymph nodes are
present along the periphery of this conglomerate.

There is accentuated metabolic activity in the palatine tonsillar
pillars, maximum SUV 11.6 on the right and 8.3 on the left.

Chronic left maxillary sinusitis.

CHEST

Hypermetabolic AP window, right hilar, and bilateral infrahilar
lymph nodes. Index AP window lymph node 1.4 cm in short axis on
image 70/4 with maximum SUV 4.9.

Mild atelectasis or scarring in the left lower lobe. Mild peripheral
interstitial accentuation in the lungs. Calcified pleural plaques
anteriorly in the right lung.

ABDOMEN/PELVIS

No abnormal hypermetabolic activity within the liver, pancreas,
adrenal glands, or spleen. No hypermetabolic lymph nodes in the
abdomen or pelvis.

Aortoiliac atherosclerotic vascular disease.  Left adrenal adenoma.

SKELETON

No focal hypermetabolic activity to suggest skeletal metastasis.
IMPRESSION: 1. Highly hypermetabolic right conglomerate supraclavicular mass.
This is accompanied by accentuated metabolic activity in the
palatine tonsillar pillars, a hypermetabolic right buccinator lymph
node, hypermetabolic thoracic lymph nodes including a mildly
enlarged AP window lymph node and other upper normal size but mildly
hypermetabolic hilar and infrahilar nodes. Appearance compatible
with malignancy such as lymphoma or adenocarcinoma, tissue diagnosis
is recommended and probably most readily accessible from the large
right supraclavicular mass.
2.  Aortic Atherosclerosis ([id number]).
3. Left adrenal adenoma.
4. Chronic left maxillary sinusitis.

## 2019-12-30 NOTE — Therapy (Signed)
[facility] [address] [place], [place], [address] Phone: [phone]   Fax:  [phone]  Patient Details  Name: Todd Bell MRN: [id number] Date of Birth: February 20, 1946 Referring Provider:  Eppie Gibson, MD  Encounter Date: 12/30/2019   SPEECH THERAPY DISCHARGE SUMMARY  Visits from Start of Care: 2  Current functional level related to goals / functional outcomes: Pt was seen for 2 ST sessions. No goals were met in that short time. Goals/impression from his last session in 2019 follow:  SLP Short Term Goals - 02/22/18 [place] #1    Title  pt will complete HEP with occasional min A      Status  Not Met          SLP SHORT TERM GOAL #2    Title  pt will tell SLP why he is completing HEP with min A     Status  Not Met              SLP Long Term Goals - 02/22/18 1018              SLP LONG TERM GOAL #1    Title  pt will complete HEP with occasional mod A     Time  1     Period  -- visits     Status  Revised          SLP LONG TERM GOAL #2    Title  pt will tell SLP why he is completing HEP with mod A     Time  1     Period  -- visits     Status  Revised              Plan - 02/22/18 1016     Clinical Impression Statement  Pt with oropharyngeal swallowing essentially WNL, however the probability of swallowing difficulty increases dramatically with the initiation of radiation therapy. See "skilled intervention" for details. Pt was not completing full scope of HEP, req'd SLP to tell him rationale/reason for completion. Pt would like to be seen in two months - SLP thought this was satisfactory. Pt will need to be followed by SLP for regular assessment of accurate HEP completion as well as for safety with POs both during and following treatment/s.        Remaining deficits: None, pt unfortunately expired in 03-2018.   Education / Equipment: HEP procedure, late effects  head/neck radiation on swallow ability   Plan: Patient agrees to discharge.  Patient goals were not met. Patient is being discharged due to not returning since the last visit.  ?????      Bartlett ,MS, CCC-SLP  12/30/2019, 11:46 AM  [facility] [address] [place], [place], [address] Phone: [phone]   Fax:  [phone]
